# Patient Record
Sex: Male | Born: 1950
Health system: Southern US, Community
[De-identification: ages and names within clinical notes are randomized; demographics above are authoritative.]

## PROBLEM LIST (undated history)

## (undated) DIAGNOSIS — K589 Irritable bowel syndrome without diarrhea: Secondary | ICD-10-CM

## (undated) DIAGNOSIS — I10 Essential (primary) hypertension: Secondary | ICD-10-CM

## (undated) DIAGNOSIS — E785 Hyperlipidemia, unspecified: Secondary | ICD-10-CM

## (undated) DIAGNOSIS — K635 Polyp of colon: Secondary | ICD-10-CM

## (undated) HISTORY — DX: Irritable bowel syndrome, unspecified: K58.9

## (undated) HISTORY — PX: COLONOSCOPY: SHX174

## (undated) HISTORY — DX: Polyp of colon: K63.5

## (undated) HISTORY — DX: Hyperlipidemia, unspecified: E78.5

## (undated) HISTORY — DX: Essential (primary) hypertension: I10

## (undated) HISTORY — PX: APPENDECTOMY: SHX54

## (undated) HISTORY — PX: HERNIA REPAIR: SHX51

## (undated) HISTORY — PX: POLYPECTOMY: SHX149

---

## 1999-01-12 ENCOUNTER — Other Ambulatory Visit: Admission: RE | Admit: 1999-01-12 | Discharge: 1999-01-12 | Payer: Self-pay | Admitting: Internal Medicine

## 1999-01-12 ENCOUNTER — Encounter (INDEPENDENT_AMBULATORY_CARE_PROVIDER_SITE_OTHER): Payer: Self-pay | Admitting: Specialist

## 2007-09-21 ENCOUNTER — Ambulatory Visit (HOSPITAL_COMMUNITY): Admission: RE | Admit: 2007-09-21 | Discharge: 2007-09-21 | Payer: Self-pay | Admitting: Family Medicine

## 2009-05-11 ENCOUNTER — Encounter (INDEPENDENT_AMBULATORY_CARE_PROVIDER_SITE_OTHER): Payer: Self-pay | Admitting: *Deleted

## 2009-05-12 ENCOUNTER — Ambulatory Visit: Payer: Self-pay | Admitting: Internal Medicine

## 2009-05-12 ENCOUNTER — Encounter (INDEPENDENT_AMBULATORY_CARE_PROVIDER_SITE_OTHER): Payer: Self-pay | Admitting: *Deleted

## 2009-05-24 ENCOUNTER — Ambulatory Visit: Payer: Self-pay | Admitting: Internal Medicine

## 2009-05-25 ENCOUNTER — Encounter: Payer: Self-pay | Admitting: Internal Medicine

## 2009-07-05 ENCOUNTER — Ambulatory Visit (HOSPITAL_COMMUNITY): Admission: RE | Admit: 2009-07-05 | Discharge: 2009-07-05 | Payer: Self-pay | Admitting: General Surgery

## 2010-03-06 NOTE — Miscellaneous (Signed)
Summary: [facility] Previsit/prep  Clinical Lists Changes  Medications: Added new medication of MOVIPREP 100 GM  SOLR (PEG-KCL-NACL-NASULF-NA ASC-C) As per prep instructions. - Signed Rx of MOVIPREP 100 GM  SOLR (PEG-KCL-NACL-NASULF-NA ASC-C) As per prep instructions.;  #1 x 0;  Signed;  Entered by: Wyona Almas RN;  Authorized by: Hilarie Fredrickson MD;  Method used: Electronically to [facility] [phone]*, [address] [place], [place], [place]  [address], Ph: [phone] or [phone], Fax: [phone] Allergies: Added new allergy or adverse reaction of PENICILLIN Observations: Added new observation of NKA: F (05/12/2009 8:30)    Prescriptions: MOVIPREP 100 GM  SOLR (PEG-KCL-NACL-NASULF-NA ASC-C) As per prep instructions.  #1 x 0   Entered by:   Wyona Almas RN   Authorized by:   Hilarie Fredrickson MD   Signed by:   Wyona Almas RN on 05/12/2009   Method used:   Electronically to        [facility] [phone]* (retail)       [address]       [place], [place]  [address]       Ph: [phone] or [phone]       Fax: [phone]   RxID:   [id number]

## 2010-03-06 NOTE — Letter (Signed)
Summary: [facility] Instructions  [facility]  [address] [place], [place] [address]   Phone: [phone]  Fax: [phone]       Rodney Glover    [birth year]    MRN: [id number]        Procedure Day Dorna Bloom:  [facility]  05/24/09     Arrival Time:  10:30AM     Procedure Time:  11:30AM     Location of Procedure:                    _ X_  [facility] (4th Floor)   PREPARATION FOR COLONOSCOPY WITH MOVIPREP   Starting 5 days prior to your procedure 05/19/09 do not eat nuts, seeds, popcorn, corn, beans, peas,  salads, or any raw vegetables.  Do not take any fiber supplements (e.g. Metamucil, Citrucel, and Benefiber).  THE DAY BEFORE YOUR PROCEDURE         DATE: TUESDAY  DAY: 05/23/09  1.  Drink clear liquids the entire day-NO SOLID FOOD  2.  Do not drink anything colored red or purple.  Avoid juices with pulp.  No orange juice.  3.  Drink at least 64 oz. (8 glasses) of fluid/clear liquids during the day to prevent dehydration and help the prep work efficiently.  CLEAR LIQUIDS INCLUDE: Water Jello Ice Popsicles Tea (sugar ok, no milk/cream) Powdered fruit flavored drinks Coffee (sugar ok, no milk/cream) Gatorade Juice: apple, white grape, white cranberry  Lemonade Clear bullion, consomm, broth Carbonated beverages (any kind) Strained chicken noodle soup Hard Candy                             4.  In the morning, mix first dose of MoviPrep solution:    Empty 1 Pouch A and 1 Pouch B into the disposable container    Add lukewarm drinking water to the top line of the container. Mix to dissolve    Refrigerate (mixed solution should be used within 24 hrs)  5.  Begin drinking the prep at 5:00 p.m. The MoviPrep container is divided by 4 marks.   Every 15 minutes drink the solution down to the next mark (approximately 8 oz) until the full liter is complete.   6.  Follow completed prep with 16 oz of clear liquid of your choice (Nothing red or purple).   Continue to drink clear liquids until bedtime.  7.  Before going to bed, mix second dose of MoviPrep solution:    Empty 1 Pouch A and 1 Pouch B into the disposable container    Add lukewarm drinking water to the top line of the container. Mix to dissolve    Refrigerate  THE DAY OF YOUR PROCEDURE      DATE: 05/24/09 DAY: WEDNESDAY  Beginning at 6:30AM (5 hours before procedure):         1. Every 15 minutes, drink the solution down to the next mark (approx 8 oz) until the full liter is complete.  2. Follow completed prep with 16 oz. of clear liquid of your choice.    3. You may drink clear liquids until 9:30AM (2 HOURS BEFORE PROCEDURE).   MEDICATION INSTRUCTIONS  Unless otherwise instructed, you should take regular prescription medications with a small sip of water   as early as possible the morning of your procedure.  Diabetic patients - see separate instructions.    Additional medication instructions: Be sure to take your Amlodipine/Benazapril  the morning of your procedure.         OTHER INSTRUCTIONS  You will need a responsible adult at least 59 years of age to accompany you and drive you home.   This person must remain in the waiting room during your procedure.  Wear loose fitting clothing that is easily removed.  Leave jewelry and other valuables at home.  However, you may wish to bring a book to read or  an iPod/MP3 player to listen to music as you wait for your procedure to start.  Remove all body piercing jewelry and leave at home.  Total time from sign-in until discharge is approximately 2-3 hours.  You should go home directly after your procedure and rest.  You can resume normal activities the  day after your procedure.  The day of your procedure you should not:   Drive   Make legal decisions   Operate machinery   Drink alcohol   Return to work  You will receive specific instructions about eating, activities and medications before you  leave.    The above instructions have been reviewed and explained to me by   Wyona Almas RN  May 12, 2009 9:05 AM    I fully understand and can verbalize these instructions _____________________________ Date _________

## 2010-03-06 NOTE — Procedures (Signed)
Summary: Colonoscopy  Patient: Rodney Glover Note: All result statuses are Final unless otherwise noted.  Tests: (1) Colonoscopy (COL)   COL Colonoscopy           DONE     [facility]     [address]     [place], [place]  [address]           COLONOSCOPY PROCEDURE REPORT           PATIENT:  Rodney Glover, Rodney Glover  MR#:  [id number]     BIRTHDATE:  Feb 16, 1950, 59 yrs. old  GENDER:  male     ENDOSCOPIST:  Wilhemina Bonito. Eda Keys, MD     REF. BY:  Surveillance Program Recall,     PROCEDURE DATE:  05/24/2009     PROCEDURE:  Colonoscopy with snare polypectomy X 5     ASA CLASS:  Class II     INDICATIONS:  history of pre-cancerous (adenomatous) colon polyps     (INDEX 2000, f/u 2005), surveillance and high-risk screening     MEDICATIONS:   Fentanyl 100 mcg IV, Versed 10 mg IV           DESCRIPTION OF PROCEDURE:   After the risks benefits and     alternatives of the procedure were thoroughly explained, informed     consent was obtained.  Digital rectal exam was performed and     revealed no abnormalities.   The LB [id number] endoscope     was introduced through the anus and advanced to the cecum, which     was identified by both the appendix and ileocecal valve, without     limitations.Time to cecum = 3:74min. The quality of the prep was     excellent, using MoviPrep.  The instrument was then slowly     withdrawn (time = 18:39 min) as the colon was fully examined.     <<PROCEDUREIMAGES>>           FINDINGS:  Five polyps were found in the cecum (4mm) and     ascending colon (10mm,3mm,3mm,5mm). Polyps were snared without     cautery. Retrieval was successful in 4 of 5.  This was otherwise a     normal examination of the colon.   Retroflexed views in the rectum     revealed internal hemorrhoids.    The scope was then withdrawn     from the patient and the procedure completed.           COMPLICATIONS:  None     ENDOSCOPIC IMPRESSION:     1) Five small polyps - removed     2)  Otherwise normal examination     3) Small Internal hemorrhoids           RECOMMENDATIONS:     1) Follow up colonoscopy in 5 years           ______________________________     Wilhemina Bonito. Eda Keys, MD           CC:  Rudi Heap, MD; The Patient           n.     eSIGNED:   Wilhemina Bonito. Eda Keys at 05/24/2009 01:02 PM           Winfred Burn, [id number]  Note: An exclamation mark (!) indicates a result that was not dispersed into the flowsheet. Document Creation Date: 05/24/2009 1:03 PM _______________________________________________________________________  (1) Order result status: Final Collection or observation date-time: 05/24/2009 12:53 Requested  date-time:  Receipt date-time:  Reported date-time:  Referring Physician:   Ordering Physician: Fransico Setters [phone]) Specimen Source:  Source: Launa Grill Order Number: [id number] Lab site:   Appended Document: Colonoscopy recall in 5 yrs/05-2014     Procedures Next Due Date:    Colonoscopy: 06/2014

## 2010-03-06 NOTE — Letter (Signed)
Summary: Diabetic Instructions  [facility]  [address] [place], [place] [address]   Phone: [phone]  Fax: [phone]    Rodney Glover [birth year] MRN: [id number]   _ x _   ORAL DIABETIC MEDICATION INSTRUCTIONS  The day before your procedure:   Take your diabetic pill as you do normally  The day of your procedure:   Do not take your diabetic pill    We will check your blood sugar levels during the admission process and again in Recovery before discharging you home  ________________________________________________________________________

## 2010-03-06 NOTE — Letter (Signed)
Summary: Patient Notice- Polyp Results  [facility]  [address] [place], [place] [address]   Phone: [phone]  Fax: [phone]        May 25, 2009 MRN: [id number]    Rodney Glover [address] [place], [place]  [address]    Dear [name],  I am pleased to inform you that the colon polyp(s) removed during your recent colonoscopy was (were) found to be benign (no cancer detected) upon pathologic examination.  I recommend you have a repeat colonoscopy examination in 5 years to look for recurrent polyps, as having colon polyps increases your risk for having recurrent polyps or even colon cancer in the future.  Should you develop new or worsening symptoms of abdominal pain, bowel habit changes or bleeding from the rectum or bowels, please schedule an evaluation with either your primary care physician or with me.  Additional information/recommendations:  __ No further action with gastroenterology is needed at this time. Please      follow-up with your primary care physician for your other healthcare      needs.  Please call us if you are having persistent problems or have questions about your condition that have not been fully answered at this time.  Sincerely,  Hilarie Fredrickson MD  This letter has been electronically signed by your physician.  Appended Document: Patient Notice- Polyp Results letter mailed 4.26.11

## 2010-03-07 LAB — HEMOGLOBIN A1C: Hgb A1c MFr Bld: 6.5 % — AB (ref 4.0–6.0)

## 2010-04-23 LAB — BASIC METABOLIC PANEL
BUN: 11 mg/dL (ref 6–23)
CO2: 29 mEq/L (ref 19–32)
Chloride: 105 mEq/L (ref 96–112)
Creatinine, Ser: 0.79 mg/dL (ref 0.4–1.5)
Potassium: 4.4 mEq/L (ref 3.5–5.1)

## 2010-04-23 LAB — DIFFERENTIAL
Basophils Absolute: 0 10*3/uL (ref 0.0–0.1)
Basophils Relative: 1 % (ref 0–1)
Eosinophils Absolute: 0.1 10*3/uL (ref 0.0–0.7)
Lymphocytes Relative: 38 % (ref 12–46)
Monocytes Relative: 9 % (ref 3–12)
Neutro Abs: 2.7 10*3/uL (ref 1.7–7.7)
Neutrophils Relative %: 51 % (ref 43–77)

## 2010-04-23 LAB — GLUCOSE, CAPILLARY: Glucose-Capillary: 144 mg/dL — ABNORMAL HIGH (ref 70–99)

## 2010-04-23 LAB — CBC
HCT: 42.1 % (ref 39.0–52.0)
MCV: 93.4 fL (ref 78.0–100.0)

## 2010-04-24 LAB — GLUCOSE, CAPILLARY: Glucose-Capillary: 116 mg/dL — ABNORMAL HIGH (ref 70–99)

## 2010-05-23 ENCOUNTER — Encounter: Payer: Self-pay | Admitting: Nurse Practitioner

## 2010-10-11 ENCOUNTER — Telehealth: Payer: Self-pay | Admitting: Internal Medicine

## 2010-10-12 NOTE — Telephone Encounter (Signed)
Pt scheduled to see Amy Esterwood PA 10/15/10@11AM , Cato Mulligan to notify pt of appt date and time.

## 2010-10-15 ENCOUNTER — Ambulatory Visit (INDEPENDENT_AMBULATORY_CARE_PROVIDER_SITE_OTHER): Payer: BC Managed Care – PPO | Admitting: Physician Assistant

## 2010-10-15 ENCOUNTER — Encounter: Payer: Self-pay | Admitting: Physician Assistant

## 2010-10-15 ENCOUNTER — Other Ambulatory Visit (INDEPENDENT_AMBULATORY_CARE_PROVIDER_SITE_OTHER): Payer: BC Managed Care – PPO

## 2010-10-15 VITALS — BP 110/60 | HR 60 | Ht 67.0 in | Wt 177.0 lb

## 2010-10-15 DIAGNOSIS — E119 Type 2 diabetes mellitus without complications: Secondary | ICD-10-CM | POA: Insufficient documentation

## 2010-10-15 DIAGNOSIS — E1169 Type 2 diabetes mellitus with other specified complication: Secondary | ICD-10-CM | POA: Insufficient documentation

## 2010-10-15 DIAGNOSIS — R197 Diarrhea, unspecified: Secondary | ICD-10-CM

## 2010-10-15 DIAGNOSIS — Z86010 Personal history of colonic polyps: Secondary | ICD-10-CM

## 2010-10-15 DIAGNOSIS — E785 Hyperlipidemia, unspecified: Secondary | ICD-10-CM

## 2010-10-15 LAB — CBC WITH DIFFERENTIAL/PLATELET
Basophils Absolute: 0 10*3/uL (ref 0.0–0.1)
Basophils Relative: 0.2 % (ref 0.0–3.0)
HCT: 45 % (ref 39.0–52.0)
Hemoglobin: 15.1 g/dL (ref 13.0–17.0)
Lymphocytes Relative: 26.7 % (ref 12.0–46.0)
Monocytes Absolute: 0.6 10*3/uL (ref 0.1–1.0)
Platelets: 235 10*3/uL (ref 150.0–400.0)
RBC: 4.94 Mil/uL (ref 4.22–5.81)

## 2010-10-15 LAB — BASIC METABOLIC PANEL
BUN: 12 mg/dL (ref 6–23)
GFR: 114.84 mL/min (ref >60.00)
Glucose, Bld: 113 mg/dL — ABNORMAL HIGH (ref 70–99)
Sodium: 140 mEq/L (ref 135–145)

## 2010-10-15 MED ORDER — METRONIDAZOLE 250 MG PO TABS: ORAL_TABLET | ORAL | Status: DC

## 2010-10-15 MED ORDER — ALIGN 4 MG PO CAPS: 1.0000 | ORAL_CAPSULE | ORAL | Status: DC

## 2010-10-15 NOTE — Patient Instructions (Signed)
Please go to the basement level to have your labs drawn.  We have given you samples of Align Probiotic to take 1 capsule daily x 21 days. We sent a prescription to [facility]  for Flagyl to take as directed.    We made you a follow up appointment to see Dr. Marina Goodell on 10-30-2010.  Appointment card given.

## 2010-10-15 NOTE — Progress Notes (Signed)
Subjective:    Patient ID: Rodney Glover, male    DOB: 01/06/1951, 60 y.o.   MRN: [id number]  HPI Rodney Glover is a pleasant 60 year old white male who comes in today with his wife  for evaluation of diarrhea. He is known to Dr. Yancey Flemings; Had colonoscopy in  April of 2011 for followup. He was found to have 5 small polyps all of which were removed and small internal hemorrhoids. Path on his polyps showed 2 adenomatous polyps and the 2 hyperplastic polyps. Plan is for followup at a five-year interval.  Patient relates onset of his current symptoms with watery diarrhea at the beginning of July 2012. He says he started acutely with diarrhea with "pouring" watery stools. He says  he did not have any other associated symptoms, no cramping, pain, bleedin,g fever ,chills, nausea, vomiting etc. After the symptoms continued for about a week he started using Imodium which he did not find helpful and was persisting with 5-6 watery stools per day which seemed to be exacerbated  postprandially. He eventually started  an over-the-counter probiotic which she's now been on for about 3 weeks and again did not notice any change. [facility] and was given a prescription for Lomotil which she took twice daily which did seem to slow the diarrhea some but did not stop it. He denies any bloating, gas etc. He has had a gradual weight loss of about 5 pounds since onset. At this time he is usually having 3-4 watery stools per day. He had not been on any recent antibiotics has not started any new medications for several months nor any dosage changes. He did have stool studies done with stool for C. Difficile, O&P, and culture on September 3 and these were all negative. On further questioning he [place] etc[place] and says that his symptoms started after he had eaten a large amount of watermelon at the lake  earlier this summer.    Review of  Systems  Constitutional: Positive for unexpected weight change.  HENT: Negative.   Eyes: Negative.   Respiratory: Negative.   Cardiovascular: Negative.   Gastrointestinal: Positive for diarrhea.  Genitourinary: Negative.   Musculoskeletal: Negative.   Skin: Negative.   Neurological: Negative.   Psychiatric/Behavioral: Negative.    Outpatient Prescriptions Prior to Visit  Medication Sig Dispense Refill  . amLODipine-benazepril (LOTREL) 5-20 MG per capsule Take 1 capsule by mouth daily.        Marland Kitchen atorvastatin (LIPITOR) 40 MG tablet Take 40 mg by mouth daily.        . metFORMIN (GLUCOPHAGE) 500 MG tablet Take 500 mg by mouth 2 (two) times daily with a meal.         Allergies  Allergen Reactions  . Penicillins     REACTION: hives      Past history; pertinent for hyperlipidemia adult onset diabetes mellitus Family history and social history reviewed in the epicl record Objective:   Physical Exam Well-developed white male in no acute distress, pleasant, alert oriented x3 HEENT; ;  Normocephalic, EOMI ,PERRLA, sclera anicteric,Neck; Supple no JVD, Cardiovascular; regular rate and rhythm with S1-S2 no murmur rub or gallop, Pulmonary; clear bilaterally, Abdomen; soft nondistended, nontender no palpable mass or hepatosplenomegaly, bowel sounds active, Rectal; not done, Extremities; no clubbing cyanosis or edema skin benign, Psych; mood and affect normal in appropriate       Assessment & Plan:  #  59 60 year old male with two-month history of watery diarrhea; painless, nonbloody, and associated with 4-5 pound weight loss. I suspect an infectious etiology, possibly giardiasis despite negative stool studies. Other possibility would be a microscopic colitis, or medication-induced diarrhea though this is felt less likely at this time.  Plan; CBC, CRP, BMET today Start Align one daily, samples given Start an empiric trial of metronidazole 250 mg 4 times daily x10 days, patient cautioned to avoid  alcohol use during this time Return office visit in 2-3 weeks for followup with Dr. Marina Goodell- if symptoms are persisting he will need further workup, including consideration of repeat colonoscopy with biopsies.  #2 Personal history of adenomatous colon polyps   Due for followup colonoscopy April 2016  Reviewed and agree with management. Carie Caddy. Pyrtle, MD

## 2010-10-16 ENCOUNTER — Telehealth: Payer: Self-pay | Admitting: *Deleted

## 2010-10-16 NOTE — Telephone Encounter (Signed)
Message copied by Daphine Deutscher on Tue Oct 16, 2010  2:57 PM ------      Message from: [place], [place] S      Created: Tue Oct 16, 2010  1:29 PM       Please let Nyshaun know his labs are normal

## 2010-10-16 NOTE — Telephone Encounter (Signed)
Patient notified of results as per Amy Esterwood, PA 

## 2010-10-16 NOTE — Telephone Encounter (Signed)
Left a message for patient to call me. 

## 2010-10-30 ENCOUNTER — Ambulatory Visit: Payer: BC Managed Care – PPO | Admitting: Internal Medicine

## 2010-11-22 ENCOUNTER — Ambulatory Visit: Payer: Self-pay | Admitting: Internal Medicine

## 2012-05-08 ENCOUNTER — Other Ambulatory Visit: Payer: Self-pay | Admitting: *Deleted

## 2012-05-08 MED ORDER — EZETIMIBE 10 MG PO TABS: 10.0000 mg | ORAL_TABLET | Freq: Every day | ORAL | Status: DC

## 2012-05-14 ENCOUNTER — Ambulatory Visit (INDEPENDENT_AMBULATORY_CARE_PROVIDER_SITE_OTHER): Payer: BC Managed Care – PPO | Admitting: Nurse Practitioner

## 2012-05-14 ENCOUNTER — Encounter: Payer: Self-pay | Admitting: Nurse Practitioner

## 2012-05-14 VITALS — BP 146/88 | HR 55 | Temp 97.1°F | Ht 65.0 in | Wt 170.5 lb

## 2012-05-14 DIAGNOSIS — E119 Type 2 diabetes mellitus without complications: Secondary | ICD-10-CM

## 2012-05-14 DIAGNOSIS — Z139 Encounter for screening, unspecified: Secondary | ICD-10-CM

## 2012-05-14 DIAGNOSIS — I10 Essential (primary) hypertension: Secondary | ICD-10-CM

## 2012-05-14 DIAGNOSIS — E785 Hyperlipidemia, unspecified: Secondary | ICD-10-CM

## 2012-05-14 LAB — COMPLETE METABOLIC PANEL WITH GFR
Albumin: 4.4 g/dL (ref 3.5–5.2)
BUN: 9 mg/dL (ref 6–23)
CO2: 29 mEq/L (ref 19–32)
Calcium: 9.4 mg/dL (ref 8.4–10.5)
Chloride: 102 mEq/L (ref 96–112)
Creat: 0.88 mg/dL (ref 0.50–1.35)
GFR, Est African American: >89 mL/min
Potassium: 4.2 mEq/L (ref 3.5–5.3)
Sodium: 138 mEq/L (ref 135–145)

## 2012-05-14 LAB — POCT GLYCOSYLATED HEMOGLOBIN (HGB A1C): Hemoglobin A1C: 6.7

## 2012-05-14 NOTE — Progress Notes (Signed)
Subjective:    Patient ID: Rodney Glover, male    DOB: 1950-04-29, 62 y.o.   MRN: [id number]  Hyperlipidemia This is a chronic problem. The current episode started more than 1 year ago. The problem is uncontrolled. Recent lipid tests were reviewed and are high. Exacerbating diseases include diabetes. There are no known factors aggravating his hyperlipidemia. Pertinent negatives include no chest pain, leg pain, myalgias or shortness of breath. Current antihyperlipidemic treatment includes statins and ezetimibe. The current treatment provides mild improvement of lipids. There are no compliance problems.  Risk factors for coronary artery disease include diabetes mellitus, hypertension and male sex.  Diabetes He presents for his follow-up diabetic visit. He has type 2 diabetes mellitus. The initial diagnosis of diabetes was made 10 years ago. His disease course has been improving. There are no hypoglycemic associated symptoms. Pertinent negatives for hypoglycemia include no headaches. Pertinent negatives for diabetes include no blurred vision, no chest pain, no fatigue, no foot ulcerations, no polydipsia, no polyphagia, no polyuria and no weakness. There are no hypoglycemic complications. There are no diabetic complications. Risk factors for coronary artery disease include dyslipidemia, male sex and hypertension. Current diabetic treatment includes oral agent (monotherapy). He is compliant with treatment all of the time. His weight is stable. He is following a generally healthy, low fat/cholesterol and low salt diet. When asked about meal planning, he reported none. He participates in exercise three times a week. His overall blood glucose range is 130-140 mg/dl. An ACE inhibitor/angiotensin II receptor blocker is being taken. He does not see a podiatrist.Eye exam is current.  Hypertension This is a chronic problem. The current episode started more than 1 year ago. The problem has been gradually improving  since onset. The problem is controlled. Pertinent negatives include no blurred vision, chest pain, headaches, palpitations, peripheral edema or shortness of breath. There are no associated agents to hypertension. Risk factors for coronary artery disease include diabetes mellitus and male gender. Past treatments include ACE inhibitors. The current treatment provides moderate improvement. There are no compliance problems.    Pt has no other complaints at this time. Has noticed possible skin cancer on outside of both ears. Already has scheduled appt on 4/22 with oncologist. Has had lesions removed in the past.   Review of Systems  Constitutional: Negative for fatigue.  HENT: Negative.   Eyes: Negative.  Negative for blurred vision.  Respiratory: Negative for shortness of breath.   Cardiovascular: Negative for chest pain and palpitations.  Gastrointestinal: Negative.   Endocrine: Negative for polydipsia, polyphagia and polyuria.  Genitourinary: Negative.   Musculoskeletal: Negative for myalgias.  Neurological: Negative for weakness and headaches.  Psychiatric/Behavioral: Negative.        Objective:   Physical Exam  Constitutional: He is oriented to person, place, and time. He appears well-developed and well-nourished.  HENT:  Head: Normocephalic.  Right Ear: External ear normal.  Left Ear: External ear normal.  Nose: Nose normal.  Mouth/Throat: Oropharynx is clear and moist.  Eyes: EOM are normal. Pupils are equal, round, and reactive to light.  Neck: Normal range of motion. Neck supple. No thyromegaly present.  Cardiovascular: Normal rate, regular rhythm, normal heart sounds and intact distal pulses.   No murmur heard. Pulmonary/Chest: Effort normal and breath sounds normal. He has no wheezes. He has no rales.  Abdominal: Soft. Bowel sounds are normal.  Genitourinary: Prostate normal and penis normal.  Musculoskeletal: Normal range of motion.  Neurological: He is alert and oriented  to person,  place, and time.  Skin: Skin is warm and dry.  Small white patch on R. Ear   Psychiatric: He has a normal mood and affect. His behavior is normal. Judgment and thought content normal.    BP 146/88  Pulse 55  Temp(Src) 97.1 F (36.2 C) (Oral)  Ht 5\' 5"  (1.651 m)  Wt 170 lb 8 oz (77.338 kg)  BMI 28.37 kg/m2  Results for orders placed in visit on 05/14/12  POCT GLYCOSYLATED HEMOGLOBIN (HGB A1C)      Result Value Range   Hemoglobin A1C 6.7           Assessment & Plan:  1. Hyperlipidemia Continue low fat diet and exercise - NMR Lipoprofile with Lipids  2. HTN (hypertension) Low Na+ diet Exercise - COMPLETE METABOLIC PANEL WITH GFR - NMR Lipoprofile with Lipids  3. Screening - Vitamin D 25 hydroxy  4. Diabetes mellitus type 2, controlled, without complications Monitor blood sugars daily Continue diet and exercise POCT glycosylated hemoglobin (Hb A1C)  Mary-Margaret Daphine Deutscher, FNP  - COMPLETE METABOLIC PANEL WITH GFR

## 2012-05-15 LAB — NMR LIPOPROFILE WITH LIPIDS
HDL Particle Number: 36.2 umol/L (ref >=30.5)
HDL Size: 9.1 nm — ABNORMAL LOW (ref >=9.2)
LDL Particle Number: 867 nmol/L (ref <1000)
LDL Size: 19.9 nm — ABNORMAL LOW (ref >20.5)
Small LDL Particle Number: 561 nmol/L — ABNORMAL HIGH (ref <=527)
Triglycerides: 68 mg/dL (ref <150)
VLDL Size: 49.4 nm — ABNORMAL HIGH (ref <=46.6)

## 2012-06-01 ENCOUNTER — Other Ambulatory Visit: Payer: Self-pay | Admitting: *Deleted

## 2012-06-01 MED ORDER — METFORMIN HCL 1000 MG PO TABS: 1000.0000 mg | ORAL_TABLET | Freq: Two times a day (BID) | ORAL | Status: DC

## 2012-07-22 ENCOUNTER — Other Ambulatory Visit: Payer: Self-pay | Admitting: *Deleted

## 2012-07-22 MED ORDER — METFORMIN HCL 1000 MG PO TABS: 1000.0000 mg | ORAL_TABLET | Freq: Two times a day (BID) | ORAL | Status: DC

## 2012-08-09 ENCOUNTER — Other Ambulatory Visit: Payer: Self-pay | Admitting: Nurse Practitioner

## 2012-08-16 ENCOUNTER — Other Ambulatory Visit: Payer: Self-pay | Admitting: Nurse Practitioner

## 2012-08-27 ENCOUNTER — Other Ambulatory Visit: Payer: Self-pay | Admitting: Nurse Practitioner

## 2012-09-11 ENCOUNTER — Telehealth: Payer: Self-pay | Admitting: Nurse Practitioner

## 2012-09-11 NOTE — Telephone Encounter (Signed)
Appt changed per pt request.

## 2012-09-15 ENCOUNTER — Ambulatory Visit: Payer: BC Managed Care – PPO | Admitting: Nurse Practitioner

## 2012-09-17 ENCOUNTER — Ambulatory Visit (INDEPENDENT_AMBULATORY_CARE_PROVIDER_SITE_OTHER): Payer: BC Managed Care – PPO | Admitting: Nurse Practitioner

## 2012-09-17 ENCOUNTER — Encounter: Payer: Self-pay | Admitting: Nurse Practitioner

## 2012-09-17 VITALS — BP 143/81 | HR 59 | Temp 97.2°F | Ht 65.0 in | Wt 173.0 lb

## 2012-09-17 DIAGNOSIS — E119 Type 2 diabetes mellitus without complications: Secondary | ICD-10-CM

## 2012-09-17 DIAGNOSIS — E785 Hyperlipidemia, unspecified: Secondary | ICD-10-CM

## 2012-09-17 DIAGNOSIS — I10 Essential (primary) hypertension: Secondary | ICD-10-CM

## 2012-09-17 MED ORDER — METFORMIN HCL 1000 MG PO TABS: 1000.0000 mg | ORAL_TABLET | Freq: Two times a day (BID) | ORAL | Status: DC

## 2012-09-17 MED ORDER — ROSUVASTATIN CALCIUM 20 MG PO TABS: 20.0000 mg | ORAL_TABLET | Freq: Every day | ORAL | Status: DC

## 2012-09-17 MED ORDER — GLUCOSE BLOOD VI STRP: ORAL_STRIP | Status: DC

## 2012-09-17 MED ORDER — AMLODIPINE BESY-BENAZEPRIL HCL 5-20 MG PO CAPS: 1.0000 | ORAL_CAPSULE | Freq: Every day | ORAL | Status: DC

## 2012-09-17 MED ORDER — EZETIMIBE 10 MG PO TABS: 10.0000 mg | ORAL_TABLET | Freq: Every day | ORAL | Status: DC

## 2012-09-17 NOTE — Progress Notes (Signed)
Subjective:    Patient ID: Rodney Glover, male    DOB: 06/30/1950, 62 y.o.   MRN: [id number]  Hypertension This is a chronic problem. The current episode started more than 1 year ago. The problem has been gradually improving since onset. The problem is controlled. Pertinent negatives include no blurred vision, chest pain, headaches, palpitations, peripheral edema or shortness of breath. There are no associated agents to hypertension. Risk factors for coronary artery disease include diabetes mellitus and male gender. Past treatments include ACE inhibitors. The current treatment provides moderate improvement. There are no compliance problems.   Diabetes He presents for his follow-up diabetic visit. He has type 2 diabetes mellitus. The initial diagnosis of diabetes was made 10 years ago. His disease course has been improving. There are no hypoglycemic associated symptoms. Pertinent negatives for hypoglycemia include no headaches. Pertinent negatives for diabetes include no blurred vision, no chest pain, no fatigue, no foot ulcerations, no polydipsia, no polyphagia, no polyuria and no weakness. There are no hypoglycemic complications. There are no diabetic complications. Risk factors for coronary artery disease include dyslipidemia, male sex and hypertension. Current diabetic treatment includes oral agent (monotherapy). He is compliant with treatment all of the time. His weight is stable. He is following a generally healthy, low fat/cholesterol and low salt diet. When asked about meal planning, he reported none. He participates in exercise three times a week. His overall blood glucose range is 130-140 mg/dl. An ACE inhibitor/angiotensin II receptor blocker is being taken. He does not see a podiatrist.Eye exam is current.  Hyperlipidemia This is a chronic problem. The current episode started more than 1 year ago. The problem is uncontrolled. Recent lipid tests were reviewed and are high. Exacerbating diseases  include diabetes. There are no known factors aggravating his hyperlipidemia. Pertinent negatives include no chest pain, leg pain, myalgias or shortness of breath. Current antihyperlipidemic treatment includes statins and ezetimibe. The current treatment provides mild improvement of lipids. There are no compliance problems.  Risk factors for coronary artery disease include diabetes mellitus, hypertension and male sex.   Pt has no other complaints at this time. Has noticed possible skin cancer on outside of both ears. Already has scheduled appt on 4/22 with oncologist. Has had lesions removed in the past.   Review of Systems  Constitutional: Negative for fatigue.  HENT: Negative.   Eyes: Negative.  Negative for blurred vision.  Respiratory: Negative for shortness of breath.   Cardiovascular: Negative for chest pain and palpitations.  Gastrointestinal: Negative.   Endocrine: Negative for polydipsia, polyphagia and polyuria.  Genitourinary: Negative.   Musculoskeletal: Negative for myalgias.  Neurological: Negative for weakness and headaches.  Psychiatric/Behavioral: Negative.        Objective:   Physical Exam  Constitutional: He is oriented to person, place, and time. He appears well-developed and well-nourished.  HENT:  Head: Normocephalic.  Right Ear: External ear normal.  Left Ear: External ear normal.  Nose: Nose normal.  Mouth/Throat: Oropharynx is clear and moist.  Eyes: EOM are normal. Pupils are equal, round, and reactive to light.  Neck: Normal range of motion. Neck supple. No thyromegaly present.  Cardiovascular: Normal rate, regular rhythm, normal heart sounds and intact distal pulses.   No murmur heard. Pulmonary/Chest: Effort normal and breath sounds normal. He has no wheezes. He has no rales.  Abdominal: Soft. Bowel sounds are normal.  Musculoskeletal: Normal range of motion.  Neurological: He is alert and oriented to person, place, and time.  Skin: Skin is warm  and  dry.  Small white patch on R. Ear   Psychiatric: He has a normal mood and affect. His behavior is normal. Judgment and thought content normal.    BP 143/81  Pulse 59  Temp(Src) 97.2 F (36.2 C) (Oral)  Ht 5\' 5"  (1.651 m)  Wt 173 lb (78.472 kg)  BMI 28.79 kg/m2  Results for orders placed in visit on 09/17/12  POCT GLYCOSYLATED HEMOGLOBIN (HGB A1C)      Result Value Range   Hemoglobin A1C 7.5           Assessment & Plan:  1. Diabetes mellitus Continue to count carbs- stricter so dont have to change meds - POCT glycosylated hemoglobin (Hb A1C) - metFORMIN (GLUCOPHAGE) 1000 MG tablet; Take 1 tablet (1,000 mg total) by mouth 2 (two) times daily with a meal.  Dispense: 60 tablet; Refill: 5 - glucose blood (ACCU-CHEK AVIVA PLUS) test strip; Test 1x a day- dx 250.02  Dispense: 100 each; Refill: 11  2. Hyperlipidemia Low fat diet an dexercise - CMP14+EGFR - NMR, lipoprofile - ezetimibe (ZETIA) 10 MG tablet; Take 1 tablet (10 mg total) by mouth daily.  Dispense: 30 tablet; Refill: 5 - rosuvastatin (CRESTOR) 20 MG tablet; Take 1 tablet (20 mg total) by mouth daily.  Dispense: 30 tablet; Refill: 5  3. Hypertension Low NA+ diet - amLODipine-benazepril (LOTREL) 5-20 MG per capsule; Take 1 capsule by mouth daily.  Dispense: 30 capsule; Refill: 5  Mary-Margaret Daphine Deutscher, FNP

## 2012-09-17 NOTE — Patient Instructions (Signed)
Health Maintenance, Males A healthy lifestyle and preventative care can promote health and wellness.  Maintain regular health, dental, and eye exams.  Eat a healthy diet. Foods like vegetables, fruits, whole grains, low-fat dairy products, and lean protein foods contain the nutrients you need without too many calories. Decrease your intake of foods high in solid fats, added sugars, and salt. Get information about a proper diet from your caregiver, if necessary.  Regular physical exercise is one of the most important things you can do for your health. Most adults should get at least 150 minutes of moderate-intensity exercise (any activity that increases your heart rate and causes you to sweat) each week. In addition, most adults need muscle-strengthening exercises on 2 or more days a week.   Maintain a healthy weight. The body mass index (BMI) is a screening tool to identify possible weight problems. It provides an estimate of body fat based on height and weight. Your caregiver can help determine your BMI, and can help you achieve or maintain a healthy weight. For adults 20 years and older:  A BMI below 18.5 is considered underweight.  A BMI of 18.5 to 24.9 is normal.  A BMI of 25 to 29.9 is considered overweight.  A BMI of 30 and above is considered obese.  Maintain normal blood lipids and cholesterol by exercising and minimizing your intake of saturated fat. Eat a balanced diet with plenty of fruits and vegetables. Blood tests for lipids and cholesterol should begin at age 20 and be repeated every 5 years. If your lipid or cholesterol levels are high, you are over 50, or you are a high risk for heart disease, you may need your cholesterol levels checked more frequently.Ongoing high lipid and cholesterol levels should be treated with medicines, if diet and exercise are not effective.  If you smoke, find out from your caregiver how to quit. If you do not use tobacco, do not start.  If you  choose to drink alcohol, do not exceed 2 drinks per day. One drink is considered to be 12 ounces (355 mL) of beer, 5 ounces (148 mL) of wine, or 1.5 ounces (44 mL) of liquor.  Avoid use of street drugs. Do not share needles with anyone. Ask for help if you need support or instructions about stopping the use of drugs.  High blood pressure causes heart disease and increases the risk of stroke. Blood pressure should be checked at least every 1 to 2 years. Ongoing high blood pressure should be treated with medicines if weight loss and exercise are not effective.  If you are 45 to 61 years old, ask your caregiver if you should take aspirin to prevent heart disease.  Diabetes screening involves taking a blood sample to check your fasting blood sugar level. This should be done once every 3 years, after age 45, if you are within normal weight and without risk factors for diabetes. Testing should be considered at a younger age or be carried out more frequently if you are overweight and have at least 1 risk factor for diabetes.  Colorectal cancer can be detected and often prevented. Most routine colorectal cancer screening begins at the age of 50 and continues through age 75. However, your caregiver may recommend screening at an earlier age if you have risk factors for colon cancer. On a yearly basis, your caregiver may provide home test kits to check for hidden blood in the stool. Use of a small camera at the end of a tube,   to directly examine the colon (sigmoidoscopy or colonoscopy), can detect the earliest forms of colorectal cancer. Talk to your caregiver about this at age 50, when routine screening begins. Direct examination of the colon should be repeated every 5 to 10 years through age 75, unless early forms of pre-cancerous polyps or small growths are found.  Hepatitis C blood testing is recommended for all people born from 1945 through 1965 and any individual with known risks for hepatitis C.  Healthy  men should no longer receive prostate-specific antigen (PSA) blood tests as part of routine cancer screening. Consult with your caregiver about prostate cancer screening.  Testicular cancer screening is not recommended for adolescents or adult males who have no symptoms. Screening includes self-exam, caregiver exam, and other screening tests. Consult with your caregiver about any symptoms you have or any concerns you have about testicular cancer.  Practice safe sex. Use condoms and avoid high-risk sexual practices to reduce the spread of sexually transmitted infections (STIs).  Use sunscreen with a sun protection factor (SPF) of 30 or greater. Apply sunscreen liberally and repeatedly throughout the day. You should seek shade when your shadow is shorter than you. Protect yourself by wearing long sleeves, pants, a wide-brimmed hat, and sunglasses year round, whenever you are outdoors.  Notify your caregiver of new moles or changes in moles, especially if there is a change in shape or color. Also notify your caregiver if a mole is larger than the size of a pencil eraser.  A one-time screening for abdominal aortic aneurysm (AAA) and surgical repair of large AAAs by sound wave imaging (ultrasonography) is recommended for ages 65 to 75 years who are current or former smokers.  Stay current with your immunizations. Document Released: 07/20/2007 Document Revised: 04/15/2011 Document Reviewed: 06/18/2010 [employer] Patient Information 2014 [employer].  

## 2012-09-19 LAB — CMP14+EGFR
AST: 35 IU/L (ref 0–40)
Albumin: 4.2 g/dL (ref 3.6–4.8)
Alkaline Phosphatase: 62 IU/L (ref 39–117)
BUN/Creatinine Ratio: 14 (ref 10–22)
BUN: 12 mg/dL (ref 8–27)
Chloride: 101 mmol/L (ref 97–108)
GFR calc Af Amer: 107 mL/min/{1.73_m2} (ref >59)
Sodium: 140 mmol/L (ref 134–144)
Total Bilirubin: 0.4 mg/dL (ref 0.0–1.2)

## 2012-09-19 LAB — NMR, LIPOPROFILE
Cholesterol: 118 mg/dL (ref <200)
HDL Particle Number: 35.5 umol/L (ref >=30.5)
LDL Particle Number: 1231 nmol/L — ABNORMAL HIGH (ref <1000)
LDL Size: 20.2 nm — ABNORMAL LOW (ref >20.5)
LP-IR Score: 51 — ABNORMAL HIGH (ref <=45)

## 2012-09-25 ENCOUNTER — Other Ambulatory Visit: Payer: Self-pay | Admitting: Nurse Practitioner

## 2012-09-25 ENCOUNTER — Encounter: Payer: Self-pay | Admitting: Family Medicine

## 2012-09-25 DIAGNOSIS — H6193 Disorder of external ear, unspecified, bilateral: Secondary | ICD-10-CM

## 2012-09-28 ENCOUNTER — Other Ambulatory Visit: Payer: Self-pay | Admitting: Nurse Practitioner

## 2012-10-06 ENCOUNTER — Ambulatory Visit (INDEPENDENT_AMBULATORY_CARE_PROVIDER_SITE_OTHER): Payer: BC Managed Care – PPO

## 2012-10-06 ENCOUNTER — Ambulatory Visit (INDEPENDENT_AMBULATORY_CARE_PROVIDER_SITE_OTHER): Payer: BC Managed Care – PPO | Admitting: Family Medicine

## 2012-10-06 VITALS — BP 147/83 | HR 67 | Temp 98.7°F | Ht 65.0 in | Wt 176.0 lb

## 2012-10-06 DIAGNOSIS — M25569 Pain in unspecified knee: Secondary | ICD-10-CM

## 2012-10-06 DIAGNOSIS — M25561 Pain in right knee: Secondary | ICD-10-CM

## 2012-10-06 MED ORDER — TRAMADOL HCL 50 MG PO TABS: ORAL_TABLET | ORAL | Status: DC

## 2012-10-06 MED ORDER — IBUPROFEN 600 MG PO TABS: 600.0000 mg | ORAL_TABLET | Freq: Three times a day (TID) | ORAL | Status: DC | PRN

## 2012-10-06 NOTE — Progress Notes (Signed)
  Subjective:    Patient ID: Rodney Glover, male    DOB: 1950-06-27, 62 y.o.   MRN: [id number]  HPI This 62 y.o. male presents for evaluation of right knee pain.  It has been this problem for Months and it has become worse.  He has hx of DJD of the left knee and has had to have Surgery.  He states the pain is just like the pain with the left knee.  He states he had a  Torn meniscus in his left knee and after surgery his knee was perfect.  He has been very Active playing golf and moving family over the last week and now he states his right knee Is blown.  He saw Dr. Roselie Awkward in [place] did his left knee..   Review of Systems C/o right knee pain No chest pain, SOB, HA, dizziness, vision change, N/V, diarrhea, constipation, dysuria, urinary urgency or frequency or rash.     Objective:   Physical Exam Vital signs noted  Well developed well nourished male.  HEENT - Head atraumatic Normocephalic                Eyes - PERRLA, Conjuctiva - clear Sclera- Clear EOMI                Ears - EAC's Wnl TM's Wnl Gross Hearing WNL                Nose - Nares patent                 Throat - oropharanx wnl Respiratory - Lungs CTA bilateral Cardiac - RRR S1 and S2 without murmur GI - Abdomen soft Nontender and bowel sounds active x 4 MS - Decreased ROM right knee and TTP right knee at patella and bilateral. Patient limping. Neuro - Grossly intact.  Xray Right knee with DJD     Assessment & Plan:  Right knee pain - Plan: traMADol (ULTRAM) 50 MG tablet, Ambulatory referral to Orthopedic Surgery, DG Knee 1-2 Views Right, ibuprofen (ADVIL,MOTRIN) 600 MG tablet.  He has crutches to use at home And advised him to use crutches.

## 2012-10-07 NOTE — Patient Instructions (Signed)
Knee Pain  The knee is the complex joint between your thigh and your lower leg. It is made up of bones, tendons, ligaments, and cartilage. The bones that make up the knee are:   The femur in the thigh.   The tibia and fibula in the lower leg.   The patella or kneecap riding in the groove on the lower femur.  CAUSES   Knee pain is a common complaint with many causes. A few of these causes are:   Injury, such as:   A ruptured ligament or tendon injury.   Torn cartilage.   Medical conditions, such as:   Gout   Arthritis   Infections   Overuse, over training or overdoing a physical activity.  Knee pain can be minor or severe. Knee pain can accompany debilitating injury. Minor knee problems often respond well to self-care measures or get well on their own. More serious injuries may need medical intervention or even surgery.  SYMPTOMS  The knee is complex. Symptoms of knee problems can vary widely. Some of the problems are:   Pain with movement and weight bearing.   Swelling and tenderness.   Buckling of the knee.   Inability to straighten or extend your knee.   Your knee locks and you cannot straighten it.   Warmth and redness with pain and fever.   Deformity or dislocation of the kneecap.  DIAGNOSIS   Determining what is wrong may be very straight forward such as when there is an injury. It can also be challenging because of the complexity of the knee. Tests to make a diagnosis may include:   Your caregiver taking a history and doing a physical exam.   Routine X-rays can be used to rule out other problems. X-rays will not reveal a cartilage tear. Some injuries of the knee can be diagnosed by:   Arthroscopy a surgical technique by which a small video camera is inserted through tiny incisions on the sides of the knee. This procedure is used to examine and repair internal knee joint problems. Tiny instruments can be used during arthroscopy to repair the torn knee cartilage (meniscus).   Arthrography  is a radiology technique. A contrast liquid is directly injected into the knee joint. Internal structures of the knee joint then become visible on X-ray film.   An MRI scan is a non x-ray radiology procedure in which magnetic fields and a computer produce two- or three-dimensional images of the inside of the knee. Cartilage tears are often visible using an MRI scanner. MRI scans have largely replaced arthrography in diagnosing cartilage tears of the knee.   Blood work.   Examination of the fluid that helps to lubricate the knee joint (synovial fluid). This is done by taking a sample out using a needle and a syringe.  TREATMENT  The treatment of knee problems depends on the cause. Some of these treatments are:   Depending on the injury, proper casting, splinting, surgery or physical therapy care will be needed.   Give yourself adequate recovery time. Do not overuse your joints. If you begin to get sore during workout routines, back off. Slow down or do fewer repetitions.   For repetitive activities such as cycling or running, maintain your strength and nutrition.   Alternate muscle groups. For example if you are a weight lifter, work the upper body on one day and the lower body the next.   Either tight or weak muscles do not give the proper support for your   knee. Tight or weak muscles do not absorb the stress placed on the knee joint. Keep the muscles surrounding the knee strong.   Take care of mechanical problems.   If you have flat feet, orthotics or special shoes may help. See your caregiver if you need help.   Arch supports, sometimes with wedges on the inner or outer aspect of the heel, can help. These can shift pressure away from the side of the knee most bothered by osteoarthritis.   A brace called an "unloader" brace also may be used to help ease the pressure on the most arthritic side of the knee.   If your caregiver has prescribed crutches, braces, wraps or ice, use as directed. The acronym for  this is PRICE. This means protection, rest, ice, compression and elevation.   Nonsteroidal anti-inflammatory drugs (NSAID's), can help relieve pain. But if taken immediately after an injury, they may actually increase swelling. Take NSAID's with food in your stomach. Stop them if you develop stomach problems. Do not take these if you have a history of ulcers, stomach pain or bleeding from the bowel. Do not take without your caregiver's approval if you have problems with fluid retention, heart failure, or kidney problems.   For ongoing knee problems, physical therapy may be helpful.   Glucosamine and chondroitin are over-the-counter dietary supplements. Both may help relieve the pain of osteoarthritis in the knee. These medicines are different from the usual anti-inflammatory drugs. Glucosamine may decrease the rate of cartilage destruction.   Injections of a corticosteroid drug into your knee joint may help reduce the symptoms of an arthritis flare-up. They may provide pain relief that lasts a few months. You may have to wait a few months between injections. The injections do have a small increased risk of infection, water retention and elevated blood sugar levels.   Hyaluronic acid injected into damaged joints may ease pain and provide lubrication. These injections may work by reducing inflammation. A series of shots may give relief for as long as 6 months.   Topical painkillers. Applying certain ointments to your skin may help relieve the pain and stiffness of osteoarthritis. Ask your pharmacist for suggestions. Many over the-counter products are approved for temporary relief of arthritis pain.   In some countries, doctors often prescribe topical NSAID's for relief of chronic conditions such as arthritis and tendinitis. A review of treatment with NSAID creams found that they worked as well as oral medications but without the serious side effects.  PREVENTION   Maintain a healthy weight. Extra pounds put  more strain on your joints.   Get strong, stay limber. Weak muscles are a common cause of knee injuries. Stretching is important. Include flexibility exercises in your workouts.   Be smart about exercise. If you have osteoarthritis, chronic knee pain or recurring injuries, you may need to change the way you exercise. This does not mean you have to stop being active. If your knees ache after jogging or playing basketball, consider switching to swimming, water aerobics or other low-impact activities, at least for a few days a week. Sometimes limiting high-impact activities will provide relief.   Make sure your shoes fit well. Choose footwear that is right for your sport.   Protect your knees. Use the proper gear for knee-sensitive activities. Use kneepads when playing volleyball or laying carpet. Buckle your seat belt every time you drive. Most shattered kneecaps occur in car accidents.   Rest when you are tired.  SEEK MEDICAL CARE IF:     You have knee pain that is continual and does not seem to be getting better.   SEEK IMMEDIATE MEDICAL CARE IF:   Your knee joint feels hot to the touch and you have a high fever.  MAKE SURE YOU:    Understand these instructions.   Will watch your condition.   Will get help right away if you are not doing well or get worse.  Document Released: 11/18/2006 Document Revised: 04/15/2011 Document Reviewed: 11/18/2006  [employer] Patient Information 2014 [employer].

## 2012-11-10 ENCOUNTER — Telehealth: Payer: Self-pay | Admitting: Nurse Practitioner

## 2012-11-10 NOTE — Telephone Encounter (Signed)
Have you seen this ?

## 2012-11-10 NOTE — Telephone Encounter (Signed)
i filled it out- I will have it checked on to n=make sure was faxed (please check on this)

## 2012-12-07 HISTORY — PX: KNEE SURGERY: SHX244

## 2013-01-05 ENCOUNTER — Ambulatory Visit: Payer: BC Managed Care – PPO | Admitting: Nurse Practitioner

## 2013-01-20 ENCOUNTER — Encounter: Payer: Self-pay | Admitting: Nurse Practitioner

## 2013-02-17 ENCOUNTER — Encounter: Payer: Self-pay | Admitting: Nurse Practitioner

## 2013-02-17 ENCOUNTER — Ambulatory Visit (INDEPENDENT_AMBULATORY_CARE_PROVIDER_SITE_OTHER): Payer: BC Managed Care – PPO | Admitting: Nurse Practitioner

## 2013-02-17 VITALS — BP 147/83 | HR 66 | Temp 97.1°F | Ht 65.0 in | Wt 172.5 lb

## 2013-02-17 DIAGNOSIS — E785 Hyperlipidemia, unspecified: Secondary | ICD-10-CM

## 2013-02-17 DIAGNOSIS — I10 Essential (primary) hypertension: Secondary | ICD-10-CM

## 2013-02-17 DIAGNOSIS — E119 Type 2 diabetes mellitus without complications: Secondary | ICD-10-CM

## 2013-02-17 LAB — POCT GLYCOSYLATED HEMOGLOBIN (HGB A1C): Hemoglobin A1C: 9

## 2013-02-17 LAB — POCT UA - MICROALBUMIN: MICROALBUMIN (UR) POC: 50 mg/L

## 2013-02-17 MED ORDER — GLIMEPIRIDE 4 MG PO TABS: 4.0000 mg | ORAL_TABLET | Freq: Every day | ORAL | Status: DC

## 2013-02-17 NOTE — Patient Instructions (Signed)

## 2013-02-17 NOTE — Progress Notes (Signed)
Subjective:    Patient ID: Rodney Glover, male    DOB: Sep 17, 1950, 63 y.o.   MRN: [id number]  Diabetes He presents for his follow-up diabetic visit. He has type 2 diabetes mellitus. The initial diagnosis of diabetes was made 10 years ago. His disease course has been improving. There are no hypoglycemic associated symptoms. Pertinent negatives for hypoglycemia include no headaches. Pertinent negatives for diabetes include no blurred vision, no chest pain, no fatigue, no foot ulcerations, no polydipsia, no polyphagia, no polyuria and no weakness. There are no hypoglycemic complications. There are no diabetic complications. Risk factors for coronary artery disease include dyslipidemia, male sex and hypertension. Current diabetic treatment includes oral agent (monotherapy). He is compliant with treatment all of the time. His weight is stable. He is following a generally healthy, low fat/cholesterol and low salt diet. When asked about meal planning, he reported none. He participates in exercise three times a week. His breakfast blood glucose is taken between 8-9 am. His breakfast blood glucose range is generally >200 mg/dl. His overall blood glucose range is >200 mg/dl. An ACE inhibitor/angiotensin II receptor blocker is being taken. He does not see a podiatrist.Eye exam is current.  Hypertension This is a chronic problem. The current episode started more than 1 year ago. The problem has been gradually improving since onset. The problem is controlled. Pertinent negatives include no blurred vision, chest pain, headaches, palpitations, peripheral edema or shortness of breath. There are no associated agents to hypertension. Risk factors for coronary artery disease include diabetes mellitus and male gender. Past treatments include ACE inhibitors. The current treatment provides moderate improvement. There are no compliance problems.   Hyperlipidemia This is a chronic problem. The current episode started more than 1  year ago. The problem is uncontrolled. Recent lipid tests were reviewed and are high. Exacerbating diseases include diabetes. There are no known factors aggravating his hyperlipidemia. Pertinent negatives include no chest pain, leg pain, myalgias or shortness of breath. Current antihyperlipidemic treatment includes statins and ezetimibe. The current treatment provides mild improvement of lipids. There are no compliance problems.  Risk factors for coronary artery disease include diabetes mellitus, hypertension and male sex.   * since last visit patient injured knee and had to have arthroscopy of right knee for torn meniscus. Had surgery November 3,2014- since then his blood sugar has been high.   Review of Systems  Constitutional: Negative for fatigue.  HENT: Negative.   Eyes: Negative.  Negative for blurred vision.  Respiratory: Negative for shortness of breath.   Cardiovascular: Negative for chest pain and palpitations.  Gastrointestinal: Negative.   Endocrine: Negative for polydipsia, polyphagia and polyuria.  Genitourinary: Negative.   Musculoskeletal: Negative for myalgias.  Neurological: Negative for weakness and headaches.  Psychiatric/Behavioral: Negative.        Objective:   Physical Exam  Constitutional: He is oriented to person, place, and time. He appears well-developed and well-nourished.  HENT:  Head: Normocephalic.  Right Ear: External ear normal.  Left Ear: External ear normal.  Nose: Nose normal.  Mouth/Throat: Oropharynx is clear and moist.  Eyes: EOM are normal. Pupils are equal, round, and reactive to light.  Neck: Normal range of motion. Neck supple. No thyromegaly present.  Cardiovascular: Normal rate, regular rhythm, normal heart sounds and intact distal pulses.   No murmur heard. Pulmonary/Chest: Effort normal and breath sounds normal. He has no wheezes. He has no rales.  Abdominal: Soft. Bowel sounds are normal.  Musculoskeletal: Normal range of motion.  Neurological: He is alert and oriented to person, place, and time.  Skin: Skin is warm and dry.     Psychiatric: He has a normal mood and affect. His behavior is normal. Judgment and thought content normal.    BP 147/83  Pulse 66  Temp(Src) 97.1 F (36.2 C) (Oral)  Ht '5\' 5"'  (1.651 m)  Wt 172 lb 8 oz (78.245 kg)  BMI 28.71 kg/m2     Results for orders placed in visit on 02/17/13  POCT GLYCOSYLATED HEMOGLOBIN (HGB A1C)      Result Value Range   Hemoglobin A1C 9.0%      Assessment & Plan:   1. Diabetes mellitus   2. Hyperlipidemia   3. Hypertension    Orders Placed This Encounter  Procedures  . CMP14+EGFR  . NMR, lipoprofile  . POCT glycosylated hemoglobin (Hb A1C)  . POCT UA - Microalbumin   Meds ordered this encounter  Medications  . glimepiride (AMARYL) 4 MG tablet    Sig: Take 1 tablet (4 mg total) by mouth daily before breakfast.    Dispense:  30 tablet    Refill:  3    Order Specific Question:  Supervising Provider    Answer:  Joycelyn Man   Added amaryl to meds Keep diary of blood sugar Health maintenance reviewed Diet and exercise encouraged Continue all meds Follow up  In 3 months   [place], FNP

## 2013-02-18 LAB — CMP14+EGFR
A/G RATIO: 2 (ref 1.1–2.5)
ALT: 52 IU/L — AB (ref 0–44)
AST: 40 IU/L (ref 0–40)
Albumin: 4.7 g/dL (ref 3.6–4.8)
Alkaline Phosphatase: 73 IU/L (ref 39–117)
BILIRUBIN TOTAL: 0.5 mg/dL (ref 0.0–1.2)
BUN / CREAT RATIO: 9 — AB (ref 10–22)
BUN: 8 mg/dL (ref 8–27)
CO2: 26 mmol/L (ref 18–29)
Calcium: 9.5 mg/dL (ref 8.6–10.2)
Chloride: 94 mmol/L — ABNORMAL LOW (ref 97–108)
Creatinine, Ser: 0.87 mg/dL (ref 0.76–1.27)
GFR, EST AFRICAN AMERICAN: 108 mL/min/{1.73_m2} (ref >59)
GFR, EST NON AFRICAN AMERICAN: 93 mL/min/{1.73_m2} (ref >59)
GLOBULIN, TOTAL: 2.3 g/dL (ref 1.5–4.5)
Glucose: 194 mg/dL — ABNORMAL HIGH (ref 65–99)
POTASSIUM: 5.1 mmol/L (ref 3.5–5.2)
SODIUM: 138 mmol/L (ref 134–144)
Total Protein: 7 g/dL (ref 6.0–8.5)

## 2013-02-18 LAB — NMR, LIPOPROFILE
Cholesterol: 124 mg/dL (ref <200)
HDL Cholesterol by NMR: 40 mg/dL (ref >=40)
HDL PARTICLE NUMBER: 33.4 umol/L (ref >=30.5)
LDL Particle Number: 946 nmol/L (ref <1000)
LDL SIZE: 20.2 nm — AB (ref >20.5)
LDLC SERPL CALC-MCNC: 58 mg/dL (ref <100)
LP-IR Score: 75 — ABNORMAL HIGH (ref <=45)
SMALL LDL PARTICLE NUMBER: 585 nmol/L — AB (ref <=527)
Triglycerides by NMR: 128 mg/dL (ref <150)

## 2013-02-18 LAB — MICROALBUMIN, URINE: MICROALBUM., U, RANDOM: 46.2 ug/mL — AB (ref 0.0–17.0)

## 2013-03-09 ENCOUNTER — Other Ambulatory Visit: Payer: BC Managed Care – PPO

## 2013-03-09 DIAGNOSIS — Z1212 Encounter for screening for malignant neoplasm of rectum: Secondary | ICD-10-CM

## 2013-03-10 LAB — FECAL OCCULT BLOOD, IMMUNOCHEMICAL: Fecal Occult Bld: NEGATIVE

## 2013-04-12 ENCOUNTER — Other Ambulatory Visit: Payer: Self-pay | Admitting: Nurse Practitioner

## 2013-04-21 ENCOUNTER — Other Ambulatory Visit: Payer: Self-pay | Admitting: *Deleted

## 2013-04-21 DIAGNOSIS — E119 Type 2 diabetes mellitus without complications: Secondary | ICD-10-CM

## 2013-04-21 MED ORDER — METFORMIN HCL 1000 MG PO TABS: 1000.0000 mg | ORAL_TABLET | Freq: Two times a day (BID) | ORAL | Status: DC

## 2013-05-03 ENCOUNTER — Other Ambulatory Visit: Payer: Self-pay | Admitting: *Deleted

## 2013-05-03 MED ORDER — AMLODIPINE BESY-BENAZEPRIL HCL 5-20 MG PO CAPS: ORAL_CAPSULE | ORAL | Status: DC

## 2013-05-20 ENCOUNTER — Encounter: Payer: Self-pay | Admitting: Nurse Practitioner

## 2013-05-20 ENCOUNTER — Ambulatory Visit (INDEPENDENT_AMBULATORY_CARE_PROVIDER_SITE_OTHER): Payer: BC Managed Care – PPO | Admitting: Nurse Practitioner

## 2013-05-20 VITALS — BP 156/88 | HR 65 | Temp 98.2°F | Ht 65.0 in | Wt 175.4 lb

## 2013-05-20 DIAGNOSIS — Z0000 Encounter for general adult medical examination without abnormal findings: Secondary | ICD-10-CM

## 2013-05-20 DIAGNOSIS — E119 Type 2 diabetes mellitus without complications: Secondary | ICD-10-CM

## 2013-05-20 DIAGNOSIS — I10 Essential (primary) hypertension: Secondary | ICD-10-CM

## 2013-05-20 DIAGNOSIS — E785 Hyperlipidemia, unspecified: Secondary | ICD-10-CM

## 2013-05-20 LAB — POCT GLYCOSYLATED HEMOGLOBIN (HGB A1C): HEMOGLOBIN A1C: 6.3

## 2013-05-20 MED ORDER — METFORMIN HCL 1000 MG PO TABS: 1000.0000 mg | ORAL_TABLET | Freq: Two times a day (BID) | ORAL | Status: DC

## 2013-05-20 MED ORDER — GLIMEPIRIDE 4 MG PO TABS: 4.0000 mg | ORAL_TABLET | Freq: Every day | ORAL | Status: DC

## 2013-05-20 NOTE — Progress Notes (Signed)
Subjective:    Patient ID: Rodney Glover, male    DOB: [birth year], 63 y.o.   MRN: [id number]  Patient presents today for follow-up of chronic medical problems - No changes or new complaints. Diabetes He presents for his follow-up diabetic visit. He has type 2 diabetes mellitus. The initial diagnosis of diabetes was made 10 years ago. His disease course has been improving. There are no hypoglycemic associated symptoms. Pertinent negatives for hypoglycemia include no headaches. Pertinent negatives for diabetes include no blurred vision, no chest pain, no fatigue, no foot ulcerations, no polydipsia, no polyphagia, no polyuria and no weakness. There are no hypoglycemic complications. There are no diabetic complications. Risk factors for coronary artery disease include dyslipidemia, male sex and hypertension. Current diabetic treatment includes oral agent (monotherapy) (Walks about 15 miles per walk playing golf). He is compliant with treatment all of the time. His weight is stable. He is following a generally healthy, low fat/cholesterol and low salt diet. When asked about meal planning, he reported none. He participates in exercise three times a week. His breakfast blood glucose is taken between 8-9 am. His breakfast blood glucose range is generally 90-110 mg/dl. His overall blood glucose range is 90-110 mg/dl. An ACE inhibitor/angiotensin II receptor blocker is being taken. He does not see a podiatrist.Eye exam is current.  Hypertension This is a chronic problem. The current episode started more than 1 year ago. The problem has been gradually improving since onset. The problem is controlled. Pertinent negatives include no blurred vision, chest pain, headaches, palpitations, peripheral edema or shortness of breath. There are no associated agents to hypertension. Risk factors for coronary artery disease include diabetes mellitus and male gender. Past treatments include ACE inhibitors. The current treatment  provides moderate improvement. There are no compliance problems.   Hyperlipidemia This is a chronic problem. The current episode started more than 1 year ago. The problem is uncontrolled. Recent lipid tests were reviewed and are high. Exacerbating diseases include diabetes. There are no known factors aggravating his hyperlipidemia. Pertinent negatives include no chest pain, leg pain, myalgias or shortness of breath. Current antihyperlipidemic treatment includes statins and ezetimibe. The current treatment provides mild improvement of lipids. There are no compliance problems.  Risk factors for coronary artery disease include diabetes mellitus, hypertension and male sex.     Review of Systems  Constitutional: Negative for fatigue.  HENT: Negative.   Eyes: Negative.  Negative for blurred vision.  Respiratory: Negative for shortness of breath.   Cardiovascular: Negative for chest pain and palpitations.  Gastrointestinal: Negative.   Endocrine: Negative for polydipsia, polyphagia and polyuria.  Genitourinary: Negative.   Musculoskeletal: Negative for myalgias.  Neurological: Negative for weakness and headaches.  Psychiatric/Behavioral: Negative.   All other systems reviewed and are negative.      Objective:   Physical Exam  Constitutional: He is oriented to person, place, and time. He appears well-developed and well-nourished.  HENT:  Head: Normocephalic.  Right Ear: External ear normal.  Left Ear: External ear normal.  Nose: Nose normal.  Mouth/Throat: Oropharynx is clear and moist.  Eyes: EOM are normal. Pupils are equal, round, and reactive to light.  Neck: Normal range of motion. Neck supple. No thyromegaly present.  Cardiovascular: Normal rate, regular rhythm, normal heart sounds and intact distal pulses.   No murmur heard. Pulmonary/Chest: Effort normal and breath sounds normal. He has no wheezes. He has no rales.  Abdominal: Soft. Bowel sounds are normal.  Musculoskeletal:  Normal range of motion.  Neurological: He is alert and oriented to person, place, and time.  Skin: Skin is warm and dry.     Psychiatric: He has a normal mood and affect. His behavior is normal. Judgment and thought content normal.    BP 156/88  Pulse 65  Temp(Src) 98.2 F (36.8 C) (Oral)  Ht '5\' 5"'  (1.651 m)  Wt 175 lb 6.4 oz (79.561 kg)  BMI 29.19 kg/m2   Results for orders placed in visit on 05/20/13  POCT GLYCOSYLATED HEMOGLOBIN (HGB A1C)      Result Value Ref Range   Hemoglobin A1C 6.3       PLAN:  1. Diabetes mellitus   2. Hyperlipidemia   3. Hypertension   4. Health care maintenance    Orders Placed This Encounter  Procedures  . CMP14+EGFR  . NMR, lipoprofile  . PSA, total and free  . POCT glycosylated hemoglobin (Hb A1C)   Meds ordered this encounter  Medications  . DISCONTD: metFORMIN (GLUCOPHAGE) 1000 MG tablet    Sig: Take 1 tablet (1,000 mg total) by mouth 2 (two) times daily with a meal.    Dispense:  60 tablet    Refill:  0    Order Specific Question:  Supervising Provider    Answer:  Chipper Herb [[id number]]  . glimepiride (AMARYL) 4 MG tablet    Sig: Take 1 tablet (4 mg total) by mouth daily before breakfast.    Dispense:  30 tablet    Refill:  3    Order Specific Question:  Supervising Provider    Answer:  Chipper Herb [[id number]]  . metFORMIN (GLUCOPHAGE) 1000 MG tablet    Sig: Take 1 tablet (1,000 mg total) by mouth 2 (two) times daily with a meal.    Dispense:  60 tablet    Refill:  5    Order Specific Question:  Supervising Provider    Answer:  Chipper Herb [[id number]]   Pt is to monitor BP at home and will let me know if BP is > 140/90 Labs pending Health maintenance reviewed Diet and exercise encouraged Continue all meds Follow up  In 3 months   [place], FNP

## 2013-05-20 NOTE — Patient Instructions (Signed)

## 2013-05-21 LAB — CMP14+EGFR
ALK PHOS: 54 IU/L (ref 39–117)
ALT: 34 IU/L (ref 0–44)
AST: 34 IU/L (ref 0–40)
Albumin/Globulin Ratio: 2.4 (ref 1.1–2.5)
Albumin: 4.3 g/dL (ref 3.6–4.8)
BUN / CREAT RATIO: 12 (ref 10–22)
BUN: 9 mg/dL (ref 8–27)
CALCIUM: 9.2 mg/dL (ref 8.6–10.2)
CHLORIDE: 101 mmol/L (ref 97–108)
CO2: 25 mmol/L (ref 18–29)
Creatinine, Ser: 0.77 mg/dL (ref 0.76–1.27)
GFR calc Af Amer: 112 mL/min/{1.73_m2} (ref >59)
GFR calc non Af Amer: 97 mL/min/{1.73_m2} (ref >59)
Globulin, Total: 1.8 g/dL (ref 1.5–4.5)
Glucose: 104 mg/dL — ABNORMAL HIGH (ref 65–99)
POTASSIUM: 4.5 mmol/L (ref 3.5–5.2)
Sodium: 141 mmol/L (ref 134–144)
Total Bilirubin: 0.3 mg/dL (ref 0.0–1.2)
Total Protein: 6.1 g/dL (ref 6.0–8.5)

## 2013-05-21 LAB — NMR, LIPOPROFILE
Cholesterol: 121 mg/dL (ref <200)
HDL Cholesterol by NMR: 44 mg/dL (ref >=40)
HDL Particle Number: 38 umol/L (ref >=30.5)
LDL Particle Number: 764 nmol/L (ref <1000)
LDL Size: 20.3 nm (ref >20.5)
LDLC SERPL CALC-MCNC: 64 mg/dL (ref <100)
LP-IR Score: 53 — ABNORMAL HIGH (ref <=45)
Small LDL Particle Number: 482 nmol/L (ref <=527)
Triglycerides by NMR: 67 mg/dL (ref <150)

## 2013-05-22 LAB — PSA, TOTAL AND FREE
PSA, Free Pct: 30 %
PSA, Free: 0.12 ng/mL
PSA: 0.4 ng/mL (ref 0.0–4.0)

## 2013-05-22 LAB — SPECIMEN STATUS REPORT

## 2013-07-27 ENCOUNTER — Other Ambulatory Visit: Payer: Self-pay | Admitting: Nurse Practitioner

## 2013-08-07 ENCOUNTER — Other Ambulatory Visit: Payer: Self-pay | Admitting: Nurse Practitioner

## 2013-09-14 ENCOUNTER — Ambulatory Visit (INDEPENDENT_AMBULATORY_CARE_PROVIDER_SITE_OTHER): Payer: BC Managed Care – PPO | Admitting: Nurse Practitioner

## 2013-09-14 ENCOUNTER — Encounter: Payer: Self-pay | Admitting: Nurse Practitioner

## 2013-09-14 ENCOUNTER — Telehealth: Payer: Self-pay | Admitting: Nurse Practitioner

## 2013-09-14 VITALS — BP 136/77 | HR 81 | Temp 97.9°F | Ht 65.0 in | Wt 171.0 lb

## 2013-09-14 DIAGNOSIS — E119 Type 2 diabetes mellitus without complications: Secondary | ICD-10-CM

## 2013-09-14 DIAGNOSIS — I10 Essential (primary) hypertension: Secondary | ICD-10-CM

## 2013-09-14 DIAGNOSIS — E785 Hyperlipidemia, unspecified: Secondary | ICD-10-CM

## 2013-09-14 LAB — POCT GLYCOSYLATED HEMOGLOBIN (HGB A1C): Hemoglobin A1C: 5.9

## 2013-09-14 MED ORDER — EZETIMIBE 10 MG PO TABS: ORAL_TABLET | ORAL | Status: DC

## 2013-09-14 MED ORDER — GLIMEPIRIDE 4 MG PO TABS: 4.0000 mg | ORAL_TABLET | Freq: Every day | ORAL | Status: DC

## 2013-09-14 MED ORDER — METFORMIN HCL 1000 MG PO TABS: 1000.0000 mg | ORAL_TABLET | Freq: Two times a day (BID) | ORAL | Status: DC

## 2013-09-14 MED ORDER — GLUCOSE BLOOD VI STRP: ORAL_STRIP | Status: DC

## 2013-09-14 MED ORDER — ROSUVASTATIN CALCIUM 20 MG PO TABS: ORAL_TABLET | ORAL | Status: DC

## 2013-09-14 MED ORDER — AMLODIPINE BESY-BENAZEPRIL HCL 5-20 MG PO CAPS: ORAL_CAPSULE | ORAL | Status: DC

## 2013-09-14 NOTE — Telephone Encounter (Signed)
Was on normal instead of print and order went to [facility]- cancelled order- prited new order for him to pick up.

## 2013-09-14 NOTE — Progress Notes (Signed)
Subjective:    Patient ID: Rodney Glover, male    DOB: 11-Jan-1951, 63 y.o.   MRN: [id number]  Patient presents today for follow-up of chronic medical problems - Having several episodes of hypoglycemia with blood sugars running in the am of 90-100.  Has had an episode of feeling faint, weak and checked blood sugar with it being 57 while [place] the golf [place].  Ate some cookies with blood sugar returning to normal at that time. Diabetes He presents for his follow-up diabetic visit. He has type 2 diabetes mellitus. The initial diagnosis of diabetes was made 10 years ago. His disease course has been improving. There are no hypoglycemic associated symptoms. Pertinent negatives for diabetes include no blurred vision and no foot ulcerations. There are no hypoglycemic complications. There are no diabetic complications. Risk factors for coronary artery disease include dyslipidemia, male sex and hypertension. Current diabetic treatment includes oral agent (monotherapy) (Walks about 15 miles per walk playing golf). He is compliant with treatment all of the time. His weight is stable. He is following a generally healthy, low fat/cholesterol and low salt diet. When asked about meal planning, he reported none. He participates in exercise three times a week. His breakfast blood glucose is taken between 8-9 am. His breakfast blood glucose range is generally 90-110 mg/dl. His overall blood glucose range is 90-110 mg/dl. An ACE inhibitor/angiotensin II receptor blocker is being taken. He does not see a podiatrist.Eye exam is current.  Hypertension This is a chronic problem. The current episode started more than 1 year ago. The problem has been gradually improving since onset. The problem is controlled. Pertinent negatives include no blurred vision or peripheral edema. There are no associated agents to hypertension. Risk factors for coronary artery disease include diabetes mellitus and male gender. Past treatments include ACE  inhibitors. The current treatment provides moderate improvement. There are no compliance problems.   Hyperlipidemia This is a chronic problem. The current episode started more than 1 year ago. The problem is uncontrolled. Recent lipid tests were reviewed and are high. Exacerbating diseases include diabetes. There are no known factors aggravating his hyperlipidemia. Pertinent negatives include no leg pain. Current antihyperlipidemic treatment includes statins and ezetimibe. The current treatment provides mild improvement of lipids. There are no compliance problems.  Risk factors for coronary artery disease include diabetes mellitus, hypertension and male sex.     Review of Systems  Constitutional: Negative.   Eyes: Negative for blurred vision.  Respiratory: Negative.   Cardiovascular: Negative.   Skin: Negative.   Psychiatric/Behavioral: Negative.        Objective:   Physical Exam  Constitutional: He is oriented to person, place, and time. He appears well-developed and well-nourished.  HENT:  Left Ear: External ear normal.  Nose: Nose normal.  Cardiovascular: Normal rate and regular rhythm.   Pulmonary/Chest: Effort normal.  Abdominal: Bowel sounds are normal.  Neurological: He is alert and oriented to person, place, and time.  Skin: Skin is warm and dry.     Psychiatric: He has a normal mood and affect. His behavior is normal. Judgment and thought content normal.    BP 136/77  Pulse 81  Temp(Src) 97.9 F (36.6 C) (Oral)  Ht 5' 5" (1.651 m)  Wt 171 lb (77.565 kg)  BMI 28.46 kg/m2   Results for orders placed in visit on 09/14/13  POCT GLYCOSYLATED HEMOGLOBIN (HGB A1C)      Result Value Ref Range   Hemoglobin A1C 5.9  ASSESSMENT/PLAN:  1. Type 2 diabetes mellitus without complication   2. Hyperlipidemia   3. Essential hypertension    Orders Placed This Encounter  Procedures  . CMP14+EGFR  . NMR, lipoprofile  . POCT glycosylated hemoglobin (Hb A1C)   Meds  ordered this encounter  Medications  . metFORMIN (GLUCOPHAGE) 1000 MG tablet    Sig: Take 1 tablet (1,000 mg total) by mouth 2 (two) times daily with a meal.    Dispense:  180 tablet    Refill:  1    Order Specific Question:  Supervising Provider    Answer:  Chipper Herb [[id number]]  . glimepiride (AMARYL) 4 MG tablet    Sig: Take 1 tablet (4 mg total) by mouth daily before breakfast.    Dispense:  90 tablet    Refill:  1    Order Specific Question:  Supervising Provider    Answer:  Chipper Herb [[id number]]  . ezetimibe (ZETIA) 10 MG tablet    Sig: TAKE 1 TABLET (10 MG TOTAL) BY MOUTH DAILY.    Dispense:  90 tablet    Refill:  1    Order Specific Question:  Supervising Provider    Answer:  Chipper Herb [[id number]]  . glucose blood (ACCU-CHEK AVIVA PLUS) test strip    Sig: Test 1x a day- dx 250.02    Dispense:  300 each    Refill:  11    Order Specific Question:  Supervising Provider    Answer:  Chipper Herb [[id number]]  . rosuvastatin (CRESTOR) 20 MG tablet    Sig: TAKE 1 TABLET BY MOUTH DAILY    Dispense:  90 tablet    Refill:  1    Order Specific Question:  Supervising Provider    Answer:  Chipper Herb [[id number]]  . amLODipine-benazepril (LOTREL) 5-20 MG per capsule    Sig: TAKE 1 CAPSULE EVERY DAY    Dispense:  90 capsule    Refill:  1    Order Specific Question:  Supervising Provider    Answer:  Chipper Herb [[id number]]     If frequency of hypogylcemia continues to u=increase then you can take 1/2 of amaryl rx.- and let me know at next appointment. Labs pending Health maintenance reviewed Diet and exercise encouraged Continue all meds Follow up  In 3 months   [place], FNP

## 2013-09-14 NOTE — Patient Instructions (Signed)
Diabetes and Foot Care Diabetes may cause you to have problems because of poor blood supply (circulation) to your feet and legs. This may cause the skin on your feet to become thinner, break easier, and heal more slowly. Your skin may become dry, and the skin may peel and crack. You may also have nerve damage in your legs and feet causing decreased feeling in them. You may not notice minor injuries to your feet that could lead to infections or more serious problems. Taking care of your feet is one of the most important things you can do for yourself.  HOME CARE INSTRUCTIONS  Wear shoes at all times, even in the house. Do not go barefoot. Bare feet are easily injured.  Check your feet daily for blisters, cuts, and redness. If you cannot see the bottom of your feet, use a mirror or ask someone for help.  Wash your feet with warm water (do not use hot water) and mild soap. Then pat your feet and the areas between your toes until they are completely dry. Do not soak your feet as this can dry your skin.  Apply a moisturizing lotion or petroleum jelly (that does not contain alcohol and is unscented) to the skin on your feet and to dry, brittle toenails. Do not apply lotion between your toes.  Trim your toenails straight across. Do not dig under them or around the cuticle. File the edges of your nails with an emery board or nail file.  Do not cut corns or calluses or try to remove them with medicine.  Wear clean socks or stockings every day. Make sure they are not too tight. Do not wear knee-high stockings since they may decrease blood flow to your legs.  Wear shoes that fit properly and have enough cushioning. To break in new shoes, wear them for just a few hours a day. This prevents you from injuring your feet. Always look in your shoes before you put them on to be sure there are no objects inside.  Do not cross your legs. This may decrease the blood flow to your feet.  If you find a minor scrape,  cut, or break in the skin on your feet, keep it and the skin around it clean and dry. These areas may be cleansed with mild soap and water. Do not cleanse the area with peroxide, alcohol, or iodine.  When you remove an adhesive bandage, be sure not to damage the skin around it.  If you have a wound, look at it several times a day to make sure it is healing.  Do not use heating pads or hot water bottles. They may burn your skin. If you have lost feeling in your feet or legs, you may not know it is happening until it is too late.  Make sure your health care provider performs a complete foot exam at least annually or more often if you have foot problems. Report any cuts, sores, or bruises to your health care provider immediately. SEEK MEDICAL CARE IF:   You have an injury that is not healing.  You have cuts or breaks in the skin.  You have an ingrown nail.  You notice redness on your legs or feet.  You feel burning or tingling in your legs or feet.  You have pain or cramps in your legs and feet.  Your legs or feet are numb.  Your feet always feel cold. SEEK IMMEDIATE MEDICAL CARE IF:   There is increasing redness,   swelling, or pain in or around a wound.  There is a red line that goes up your leg.  Pus is coming from a wound.  You develop a fever or as directed by your health care provider.  You notice a bad smell coming from an ulcer or wound. Document Released: 01/19/2000 Document Revised: 09/23/2012 Document Reviewed: 06/30/2012 Exit[employer] Patient Information 2015 [employer]. This information is not intended to replace advice given to you by your health care provider. Make sure you discuss any questions you have with your health care provider.  

## 2013-09-14 NOTE — Telephone Encounter (Signed)
He called to let Rodney Glover know that one of his medicines wasn't on the list she printed today.  He said that [facility] would probably fax a refill request for Glymetiride.

## 2013-09-15 LAB — CMP14+EGFR
A/G RATIO: 1.8 (ref 1.1–2.5)
ALBUMIN: 4.3 g/dL (ref 3.6–4.8)
ALT: 27 IU/L (ref 0–44)
AST: 35 IU/L (ref 0–40)
Alkaline Phosphatase: 59 IU/L (ref 39–117)
BUN / CREAT RATIO: 12 (ref 10–22)
BUN: 11 mg/dL (ref 8–27)
CO2: 25 mmol/L (ref 18–29)
Calcium: 9.1 mg/dL (ref 8.6–10.2)
Chloride: 99 mmol/L (ref 97–108)
Creatinine, Ser: 0.89 mg/dL (ref 0.76–1.27)
GFR calc Af Amer: 106 mL/min/{1.73_m2} (ref >59)
GFR, EST NON AFRICAN AMERICAN: 92 mL/min/{1.73_m2} (ref >59)
Globulin, Total: 2.4 g/dL (ref 1.5–4.5)
Glucose: 107 mg/dL — ABNORMAL HIGH (ref 65–99)
Potassium: 5.4 mmol/L — ABNORMAL HIGH (ref 3.5–5.2)
SODIUM: 138 mmol/L (ref 134–144)
Total Bilirubin: 0.4 mg/dL (ref 0.0–1.2)
Total Protein: 6.7 g/dL (ref 6.0–8.5)

## 2013-09-15 LAB — NMR, LIPOPROFILE
CHOLESTEROL: 123 mg/dL (ref 100–199)
HDL CHOLESTEROL BY NMR: 45 mg/dL (ref >39)
HDL PARTICLE NUMBER: 39.7 umol/L (ref >=30.5)
LDL Particle Number: 833 nmol/L (ref <1000)
LDL Size: 20.7 nm (ref >20.5)
LDLC SERPL CALC-MCNC: 67 mg/dL (ref 0–99)
LP-IR Score: 59 — ABNORMAL HIGH (ref <=45)
SMALL LDL PARTICLE NUMBER: 514 nmol/L (ref <=527)
Triglycerides by NMR: 55 mg/dL (ref 0–149)

## 2013-09-15 NOTE — Telephone Encounter (Signed)
Patient aware rx up front to be picked up

## 2013-10-04 ENCOUNTER — Other Ambulatory Visit: Payer: Self-pay | Admitting: Nurse Practitioner

## 2013-10-08 ENCOUNTER — Encounter: Payer: Self-pay | Admitting: *Deleted

## 2013-10-09 ENCOUNTER — Other Ambulatory Visit: Payer: Self-pay | Admitting: Nurse Practitioner

## 2013-11-10 ENCOUNTER — Encounter: Payer: Self-pay | Admitting: Internal Medicine

## 2014-01-13 ENCOUNTER — Ambulatory Visit (INDEPENDENT_AMBULATORY_CARE_PROVIDER_SITE_OTHER): Payer: BC Managed Care – PPO | Admitting: Nurse Practitioner

## 2014-01-13 ENCOUNTER — Encounter: Payer: Self-pay | Admitting: Nurse Practitioner

## 2014-01-13 VITALS — BP 136/85 | HR 64 | Temp 97.8°F | Ht 65.0 in | Wt 173.0 lb

## 2014-01-13 DIAGNOSIS — I10 Essential (primary) hypertension: Secondary | ICD-10-CM

## 2014-01-13 DIAGNOSIS — Z23 Encounter for immunization: Secondary | ICD-10-CM

## 2014-01-13 DIAGNOSIS — E119 Type 2 diabetes mellitus without complications: Secondary | ICD-10-CM

## 2014-01-13 DIAGNOSIS — E785 Hyperlipidemia, unspecified: Secondary | ICD-10-CM

## 2014-01-13 LAB — POCT GLYCOSYLATED HEMOGLOBIN (HGB A1C): Hemoglobin A1C: 6.4

## 2014-01-13 NOTE — Addendum Note (Signed)
Addended by: Rolena Infante on: 01/13/2014 11:34 AM   Modules accepted: Orders

## 2014-01-13 NOTE — Progress Notes (Signed)
Subjective:    Patient ID: Rodney Glover, male    DOB: 08/11/1950, 63 y.o.   MRN: [id number]  Patient presents today for follow-up of chronic medical problems - Having several episodes of hypoglycemia with blood sugars running in the am of 90-100.  Has had an episode of feeling faint, weak and checked blood sugar with it being 57 [place].  Ate some cookies with blood sugar returning to normal at that time. Diabetes He presents for his follow-up diabetic visit. He has type 2 diabetes mellitus. His disease course has been stable. Symptoms are stable. Risk factors for coronary artery disease include dyslipidemia, diabetes mellitus, hypertension and male sex. Current diabetic treatment includes oral agent (dual therapy). He is compliant with treatment most of the time. His weight is stable. He is following a diabetic diet. He has not had a previous visit with a dietitian. He participates in exercise daily. There is no change in his home blood glucose trend. His breakfast blood glucose is taken between 8-9 am. His breakfast blood glucose range is generally 110-130 mg/dl. His overall blood glucose range is 110-130 mg/dl. An ACE inhibitor/angiotensin II receptor blocker is being taken. Eye exam is not current.  Hypertension This is a chronic problem. The current episode started more than 1 year ago. The problem is controlled. Risk factors for coronary artery disease include diabetes mellitus, dyslipidemia and male gender. Past treatments include calcium channel blockers and ACE inhibitors. The current treatment provides moderate improvement. There are no compliance problems.   Hyperlipidemia This is a chronic problem. The current episode started more than 1 year ago. The problem is controlled. Recent lipid tests were reviewed and are normal. There are no known factors aggravating his hyperlipidemia. Current antihyperlipidemic treatment includes ezetimibe and statins. The current treatment  provides significant improvement of lipids. There are no compliance problems.  Risk factors for coronary artery disease include diabetes mellitus, dyslipidemia, hypertension and male sex.     Review of Systems  Constitutional: Negative.   Respiratory: Negative.   Cardiovascular: Negative.   Skin: Negative.   Psychiatric/Behavioral: Negative.        Objective:   Physical Exam  Constitutional: He is oriented to person, place, and time. He appears well-developed and well-nourished.  HENT:  Head: Normocephalic.  Right Ear: External ear normal.  Left Ear: External ear normal.  Nose: Nose normal.  Mouth/Throat: Oropharynx is clear and moist.  Eyes: EOM are normal. Pupils are equal, round, and reactive to light.  Neck: Normal range of motion. Neck supple. No JVD present. No thyromegaly present.  Cardiovascular: Normal rate, regular rhythm, normal heart sounds and intact distal pulses.  Exam reveals no gallop and no friction rub.   No murmur heard. Pulmonary/Chest: Effort normal and breath sounds normal. No respiratory distress. He has no wheezes. He has no rales. He exhibits no tenderness.  Abdominal: Soft. Bowel sounds are normal. He exhibits no mass. There is no tenderness.  Musculoskeletal: Normal range of motion. He exhibits no edema.  Lymphadenopathy:    He has no cervical adenopathy.  Neurological: He is alert and oriented to person, place, and time. No cranial nerve deficit.  Skin: Skin is warm and dry.  Psychiatric: He has a normal mood and affect. His behavior is normal. Judgment and thought content normal.    BP 136/85 mmHg  Pulse 64  Temp(Src) 97.8 F (36.6 C) (Oral)  Ht 5' 5" (1.651 m)  Wt 173 lb (78.472 kg)  BMI 28.79  kg/m2  / Results for orders placed or performed in visit on 01/13/14  POCT glycosylated hemoglobin (Hb A1C)  Result Value Ref Range   Hemoglobin A1C 6.4%        ASSESSMENT/PLAN:  1. Type 2 diabetes mellitus without complication Continue to  watch carbs in diet - POCT glycosylated hemoglobin (Hb A1C)  2. Hyperlipidemia Low fat diet - NMR, lipoprofile  3. Essential hypertension Do not add salt to diet - CMP14+EGFR   prevnar 13 today Labs pending Health maintenance reviewed Diet and exercise encouraged Continue all meds Follow up  In 3 month   [place], FNP

## 2014-01-13 NOTE — Patient Instructions (Signed)

## 2014-01-14 LAB — NMR, LIPOPROFILE
CHOLESTEROL: 122 mg/dL (ref 100–199)
HDL Cholesterol by NMR: 46 mg/dL (ref >39)
HDL PARTICLE NUMBER: 38.9 umol/L (ref >=30.5)
LDL Particle Number: 832 nmol/L (ref <1000)
LDL Size: 20.3 nm (ref >20.5)
LDL-C: 64 mg/dL (ref 0–99)
LP-IR Score: 45 (ref <=45)
SMALL LDL PARTICLE NUMBER: 453 nmol/L (ref <=527)
TRIGLYCERIDES BY NMR: 60 mg/dL (ref 0–149)

## 2014-01-14 LAB — CMP14+EGFR
A/G RATIO: 2 (ref 1.1–2.5)
ALT: 33 IU/L (ref 0–44)
AST: 37 IU/L (ref 0–40)
Albumin: 4.3 g/dL (ref 3.6–4.8)
Alkaline Phosphatase: 57 IU/L (ref 39–117)
BILIRUBIN TOTAL: 0.6 mg/dL (ref 0.0–1.2)
BUN/Creatinine Ratio: 13 (ref 10–22)
BUN: 12 mg/dL (ref 8–27)
CO2: 26 mmol/L (ref 18–29)
Calcium: 9.1 mg/dL (ref 8.6–10.2)
Chloride: 101 mmol/L (ref 97–108)
Creatinine, Ser: 0.89 mg/dL (ref 0.76–1.27)
GFR, EST AFRICAN AMERICAN: 106 mL/min/{1.73_m2} (ref >59)
GFR, EST NON AFRICAN AMERICAN: 92 mL/min/{1.73_m2} (ref >59)
GLOBULIN, TOTAL: 2.2 g/dL (ref 1.5–4.5)
Glucose: 100 mg/dL — ABNORMAL HIGH (ref 65–99)
Potassium: 4.8 mmol/L (ref 3.5–5.2)
Sodium: 141 mmol/L (ref 134–144)
TOTAL PROTEIN: 6.5 g/dL (ref 6.0–8.5)

## 2014-01-19 IMAGING — CR DG KNEE 1-2V*R*
2 series · 2 of 2 positions shown · non-contrast
Comparison: None.

CLINICAL DATA: Right knee pain

RIGHT KNEE - 1-2 VIEW

[view not recorded (1 of 2)]
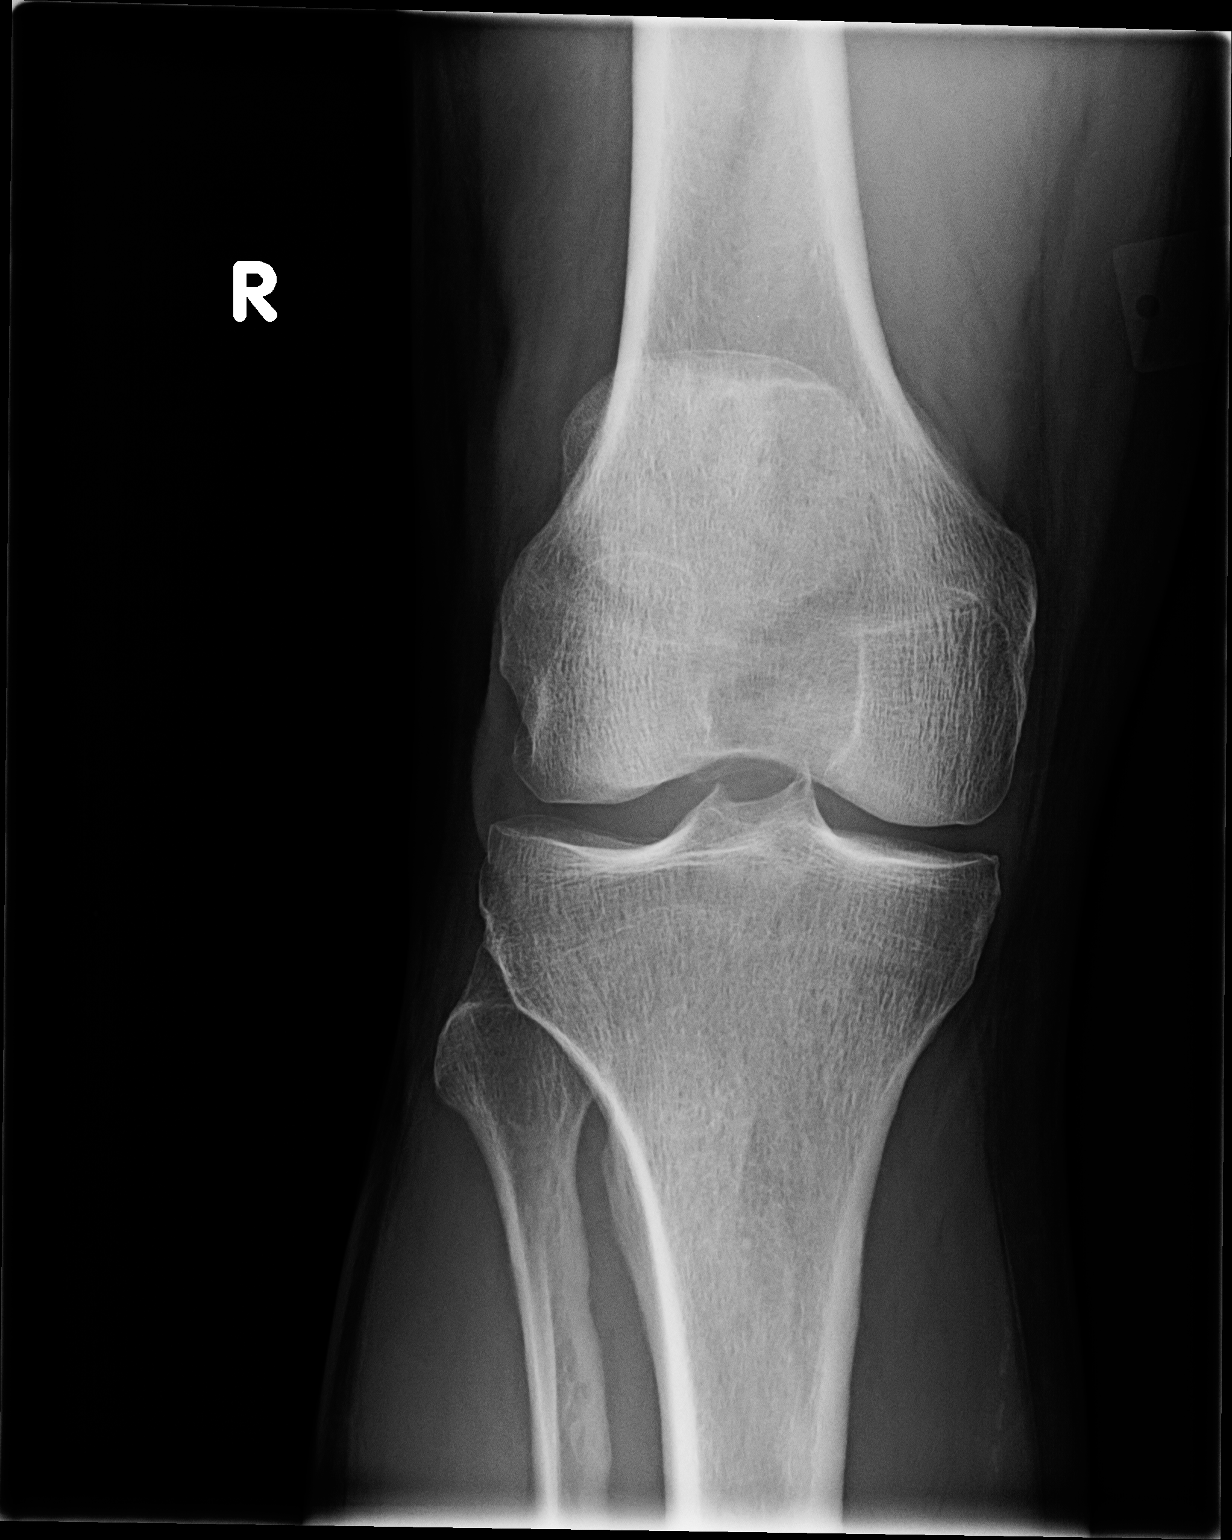

[view not recorded (2 of 2)]
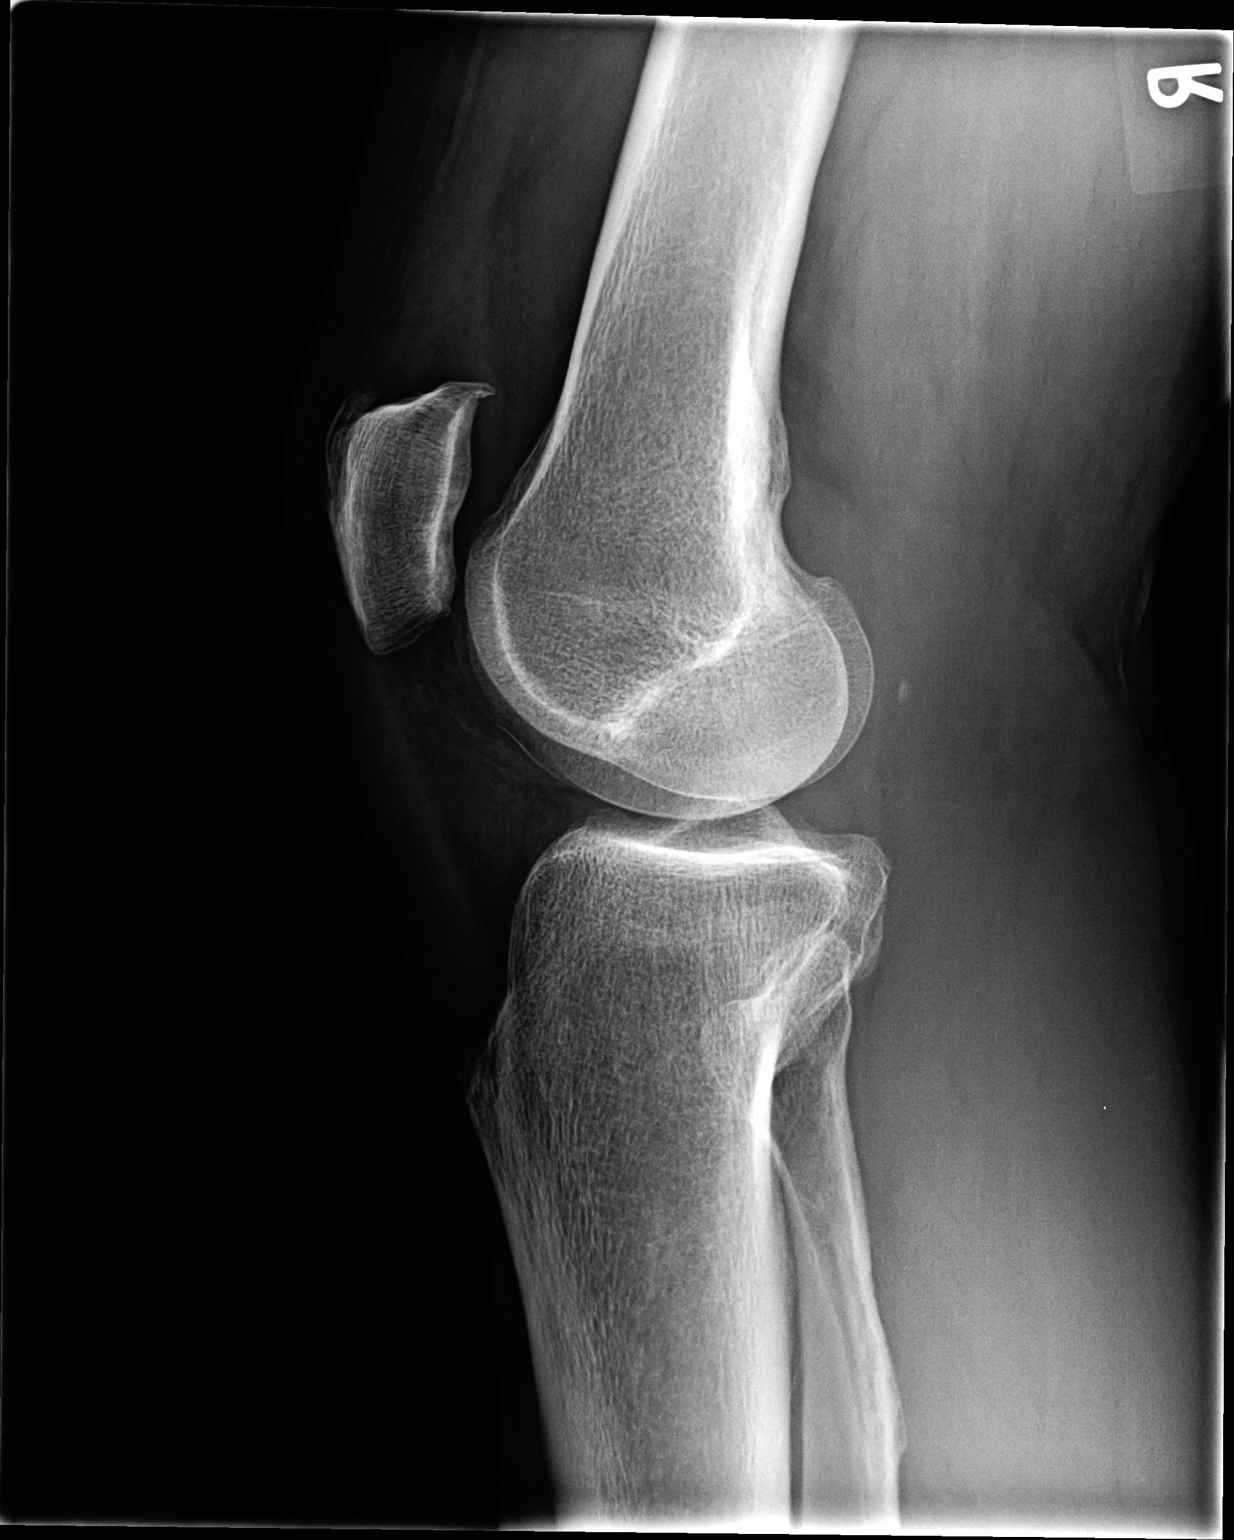

[2 of 2 positions shown; findings below may reference images not displayed]

FINDINGS: There is no joint effusion.  Moderate tricompartment
osteoarthritis is noted.  There is sharpening of the tibial spines
and marginal spur formation.  No radiopaque foreign bodies
identified.
IMPRESSION: 1.  Moderate tricompartment osteoarthritis.
2.  No acute findings.

Clinically significant discrepancy from primary report, if
provided: None

## 2014-03-01 ENCOUNTER — Other Ambulatory Visit: Payer: Self-pay | Admitting: Nurse Practitioner

## 2014-03-13 ENCOUNTER — Other Ambulatory Visit: Payer: Self-pay | Admitting: Nurse Practitioner

## 2014-03-20 ENCOUNTER — Other Ambulatory Visit: Payer: Self-pay | Admitting: Nurse Practitioner

## 2014-03-23 ENCOUNTER — Other Ambulatory Visit: Payer: Self-pay | Admitting: Nurse Practitioner

## 2014-04-26 ENCOUNTER — Other Ambulatory Visit: Payer: Self-pay | Admitting: Nurse Practitioner

## 2014-05-07 ENCOUNTER — Other Ambulatory Visit: Payer: Self-pay | Admitting: Nurse Practitioner

## 2014-05-11 ENCOUNTER — Encounter: Payer: Self-pay | Admitting: Internal Medicine

## 2014-05-12 ENCOUNTER — Ambulatory Visit (INDEPENDENT_AMBULATORY_CARE_PROVIDER_SITE_OTHER): Payer: BC Managed Care – PPO | Admitting: Nurse Practitioner

## 2014-05-12 ENCOUNTER — Encounter: Payer: Self-pay | Admitting: Nurse Practitioner

## 2014-05-12 VITALS — BP 138/82 | HR 62 | Temp 97.0°F | Ht 65.0 in | Wt 177.0 lb

## 2014-05-12 DIAGNOSIS — E119 Type 2 diabetes mellitus without complications: Secondary | ICD-10-CM | POA: Diagnosis not present

## 2014-05-12 DIAGNOSIS — I10 Essential (primary) hypertension: Secondary | ICD-10-CM

## 2014-05-12 DIAGNOSIS — E785 Hyperlipidemia, unspecified: Secondary | ICD-10-CM | POA: Diagnosis not present

## 2014-05-12 LAB — POCT UA - MICROALBUMIN: Microalbumin Ur, POC: 20 mg/L

## 2014-05-12 LAB — POCT GLYCOSYLATED HEMOGLOBIN (HGB A1C): HEMOGLOBIN A1C: 6.6

## 2014-05-12 MED ORDER — METFORMIN HCL 1000 MG PO TABS: ORAL_TABLET | ORAL | Status: DC

## 2014-05-12 MED ORDER — AMLODIPINE BESY-BENAZEPRIL HCL 5-20 MG PO CAPS: 1.0000 | ORAL_CAPSULE | Freq: Every day | ORAL | Status: DC

## 2014-05-12 MED ORDER — EZETIMIBE 10 MG PO TABS: 10.0000 mg | ORAL_TABLET | Freq: Every day | ORAL | Status: DC

## 2014-05-12 MED ORDER — GLIMEPIRIDE 4 MG PO TABS: 4.0000 mg | ORAL_TABLET | Freq: Every day | ORAL | Status: DC

## 2014-05-12 NOTE — Progress Notes (Signed)
Subjective:    Patient ID: Rodney Glover, male    DOB: April 16, 1950, 64 y.o.   MRN: [id number]  Patient presents today for follow-up of chronic medical problems - Having several episodes of hypoglycemia with blood sugars running in the am of 90-100.  Has had an episode of feeling faint, weak and checked blood sugar with it being 57 [place].  Ate some cookies with blood sugar returning to normal at that time. Diabetes He presents for his follow-up diabetic visit. He has type 2 diabetes mellitus. His disease course has been stable. Symptoms are stable. Risk factors for coronary artery disease include dyslipidemia, diabetes mellitus, hypertension and male sex. Current diabetic treatment includes oral agent (dual therapy). He is compliant with treatment most of the time. His weight is stable. He is following a diabetic diet. He has not had a previous visit with a dietitian. He participates in exercise daily. There is no change in his home blood glucose trend. His breakfast blood glucose is taken between 8-9 am. His breakfast blood glucose range is generally 110-130 mg/dl. His overall blood glucose range is 110-130 mg/dl. An ACE inhibitor/angiotensin II receptor blocker is being taken. Eye exam is not current.  Hypertension This is a chronic problem. The current episode started more than 1 year ago. The problem is controlled. Risk factors for coronary artery disease include diabetes mellitus, dyslipidemia and male gender. Past treatments include calcium channel blockers and ACE inhibitors. The current treatment provides moderate improvement. There are no compliance problems.   Hyperlipidemia This is a chronic problem. The current episode started more than 1 year ago. The problem is controlled. Recent lipid tests were reviewed and are normal. There are no known factors aggravating his hyperlipidemia. Current antihyperlipidemic treatment includes ezetimibe and statins. The current treatment  provides significant improvement of lipids. There are no compliance problems.  Risk factors for coronary artery disease include diabetes mellitus, dyslipidemia, hypertension and male sex.     Review of Systems  Constitutional: Negative.   HENT: Negative.   Respiratory: Negative.   Cardiovascular: Negative.   Genitourinary: Negative.   Skin: Negative.   Psychiatric/Behavioral: Negative.   All other systems reviewed and are negative.      Objective:   Physical Exam  Constitutional: He is oriented to person, place, and time. He appears well-developed and well-nourished.  HENT:  Head: Normocephalic.  Right Ear: External ear normal.  Left Ear: External ear normal.  Nose: Nose normal.  Mouth/Throat: Oropharynx is clear and moist.  Eyes: EOM are normal. Pupils are equal, round, and reactive to light.  Neck: Normal range of motion. Neck supple. No JVD present. No thyromegaly present.  Cardiovascular: Normal rate, regular rhythm, normal heart sounds and intact distal pulses.  Exam reveals no gallop and no friction rub.   No murmur heard. Pulmonary/Chest: Effort normal and breath sounds normal. No respiratory distress. He has no wheezes. He has no rales. He exhibits no tenderness.  Abdominal: Soft. Bowel sounds are normal. He exhibits no mass. There is no tenderness.  Musculoskeletal: Normal range of motion. He exhibits no edema.  Lymphadenopathy:    He has no cervical adenopathy.  Neurological: He is alert and oriented to person, place, and time. No cranial nerve deficit.  Skin: Skin is warm and dry.  Psychiatric: He has a normal mood and affect. His behavior is normal. Judgment and thought content normal.      BP 138/82 mmHg  Pulse 62  Temp(Src) 97 F (36.1  C) (Oral)  Ht '5\' 5"'  (1.651 m)  Wt 177 lb (80.287 kg)  BMI 29.45 kg/m2  Results for orders placed or performed in visit on 05/12/14  POCT glycosylated hemoglobin (Hb A1C)  Result Value Ref Range   Hemoglobin A1C 6.6%    POCT UA - Microalbumin  Result Value Ref Range   Microalbumin Ur, POC 20 mg/L       ASSESSMENT/PLAN: 1. Type 2 diabetes mellitus without complication Continue to watch carbs - POCT glycosylated hemoglobin (Hb A1C) - POCT UA - Microalbumin - Microalbumin, urine - glimepiride (AMARYL) 4 MG tablet; Take 1 tablet (4 mg total) by mouth daily before breakfast.  Dispense: 90 tablet; Refill: 1 - metFORMIN (GLUCOPHAGE) 1000 MG tablet; TAKE 1 TABLET TWICE A DAY WITH A MEAL  Dispense: 180 tablet; Refill: 1  2. Hyperlipidemia Low fta diet - NMR, lipoprofile - ezetimibe (ZETIA) 10 MG tablet; Take 1 tablet (10 mg total) by mouth daily.  Dispense: 90 tablet; Refill: 1  3. Essential hypertension Do not add salt to diet - CMP14+EGFR - amLODipine-benazepril (LOTREL) 5-20 MG per capsule; Take 1 capsule by mouth daily.  Dispense: 90 capsule; Refill: 1    Labs pending Health maintenance reviewed Diet and exercise encouraged Continue all meds Follow up  In 3 month   [place], FNP

## 2014-05-12 NOTE — Patient Instructions (Signed)
Exercise to Stay Healthy Exercise helps you become and stay healthy. EXERCISE IDEAS AND TIPS Choose exercises that:  You enjoy.  Fit into your day. You do not need to exercise really hard to be healthy. You can do exercises at a slow or medium level and stay healthy. You can:  Stretch before and after working out.  Try yoga, Pilates, or tai chi.  Lift weights.  Walk fast, swim, jog, run, climb stairs, bicycle, dance, or rollerskate.  Take aerobic classes. Exercises that burn about 150 calories:  Running 1  miles in 15 minutes.  Playing volleyball for 45 to 60 minutes.  Washing and waxing a car for 45 to 60 minutes.  Playing touch football for 45 minutes.  Walking 1  miles in 35 minutes.  Pushing a stroller 1  miles in 30 minutes.  Playing basketball for 30 minutes.  Raking leaves for 30 minutes.  Bicycling 5 miles in 30 minutes.  Walking 2 miles in 30 minutes.  Dancing for 30 minutes.  Shoveling snow for 15 minutes.  Swimming laps for 20 minutes.  Walking up stairs for 15 minutes.  Bicycling 4 miles in 15 minutes.  Gardening for 30 to 45 minutes.  Jumping rope for 15 minutes.  Washing windows or floors for 45 to 60 minutes. Document Released: 02/23/2010 Document Revised: 04/15/2011 Document Reviewed: 02/23/2010 [employer] Patient Information 2015 [employer]. This information is not intended to replace advice given to you by your health care provider. Make sure you discuss any questions you have with your health care provider.  

## 2014-05-13 ENCOUNTER — Other Ambulatory Visit: Payer: BC Managed Care – PPO

## 2014-05-13 DIAGNOSIS — Z1212 Encounter for screening for malignant neoplasm of rectum: Secondary | ICD-10-CM

## 2014-05-13 LAB — CMP14+EGFR
A/G RATIO: 2 (ref 1.1–2.5)
ALT: 31 IU/L (ref 0–44)
AST: 36 IU/L (ref 0–40)
Albumin: 4.6 g/dL (ref 3.6–4.8)
Alkaline Phosphatase: 55 IU/L (ref 39–117)
BUN/Creatinine Ratio: 12 (ref 10–22)
BUN: 10 mg/dL (ref 8–27)
Bilirubin Total: 0.5 mg/dL (ref 0.0–1.2)
CALCIUM: 9.6 mg/dL (ref 8.6–10.2)
CO2: 23 mmol/L (ref 18–29)
Chloride: 102 mmol/L (ref 97–108)
Creatinine, Ser: 0.82 mg/dL (ref 0.76–1.27)
GFR calc Af Amer: 109 mL/min/{1.73_m2} (ref >59)
GFR, EST NON AFRICAN AMERICAN: 94 mL/min/{1.73_m2} (ref >59)
Globulin, Total: 2.3 g/dL (ref 1.5–4.5)
Glucose: 112 mg/dL — ABNORMAL HIGH (ref 65–99)
POTASSIUM: 4.9 mmol/L (ref 3.5–5.2)
Sodium: 143 mmol/L (ref 134–144)
Total Protein: 6.9 g/dL (ref 6.0–8.5)

## 2014-05-13 LAB — MICROALBUMIN, URINE: Microalbumin, Urine: 5.4 ug/mL (ref 0.0–17.0)

## 2014-05-13 LAB — NMR, LIPOPROFILE
Cholesterol: 120 mg/dL (ref 100–199)
HDL CHOLESTEROL BY NMR: 48 mg/dL (ref >39)
HDL Particle Number: 38.8 umol/L (ref >=30.5)
LDL Particle Number: 699 nmol/L (ref <1000)
LDL Size: 20.6 nm (ref >20.5)
LDL-C: 59 mg/dL (ref 0–99)
LP-IR Score: 37 (ref <=45)
Small LDL Particle Number: 296 nmol/L (ref <=527)
Triglycerides by NMR: 67 mg/dL (ref 0–149)

## 2014-05-15 LAB — FECAL OCCULT BLOOD, IMMUNOCHEMICAL: Fecal Occult Bld: NEGATIVE

## 2014-07-03 ENCOUNTER — Other Ambulatory Visit: Payer: Self-pay | Admitting: Nurse Practitioner

## 2014-09-08 ENCOUNTER — Ambulatory Visit (INDEPENDENT_AMBULATORY_CARE_PROVIDER_SITE_OTHER): Payer: BC Managed Care – PPO | Admitting: Nurse Practitioner

## 2014-09-08 ENCOUNTER — Encounter: Payer: Self-pay | Admitting: Nurse Practitioner

## 2014-09-08 VITALS — BP 134/82 | HR 59 | Temp 97.1°F | Ht 65.0 in | Wt 169.6 lb

## 2014-09-08 DIAGNOSIS — E119 Type 2 diabetes mellitus without complications: Secondary | ICD-10-CM | POA: Diagnosis not present

## 2014-09-08 DIAGNOSIS — I10 Essential (primary) hypertension: Secondary | ICD-10-CM

## 2014-09-08 DIAGNOSIS — Z860101 Personal history of adenomatous and serrated colon polyps: Secondary | ICD-10-CM

## 2014-09-08 DIAGNOSIS — E785 Hyperlipidemia, unspecified: Secondary | ICD-10-CM | POA: Diagnosis not present

## 2014-09-08 DIAGNOSIS — Z86010 Personal history of colonic polyps: Secondary | ICD-10-CM

## 2014-09-08 LAB — POCT GLYCOSYLATED HEMOGLOBIN (HGB A1C): Hemoglobin A1C: 6.2

## 2014-09-08 MED ORDER — METFORMIN HCL 1000 MG PO TABS: ORAL_TABLET | ORAL | Status: DC

## 2014-09-08 MED ORDER — EZETIMIBE 10 MG PO TABS: 10.0000 mg | ORAL_TABLET | Freq: Every day | ORAL | Status: DC

## 2014-09-08 MED ORDER — AMLODIPINE BESY-BENAZEPRIL HCL 5-20 MG PO CAPS: 1.0000 | ORAL_CAPSULE | Freq: Every day | ORAL | Status: DC

## 2014-09-08 MED ORDER — ROSUVASTATIN CALCIUM 20 MG PO TABS: ORAL_TABLET | ORAL | Status: DC

## 2014-09-08 MED ORDER — GLIMEPIRIDE 4 MG PO TABS: 4.0000 mg | ORAL_TABLET | Freq: Every day | ORAL | Status: DC

## 2014-09-08 NOTE — Patient Instructions (Signed)
Exercise to Stay Healthy Exercise helps you become and stay healthy. EXERCISE IDEAS AND TIPS Choose exercises that:  You enjoy.  Fit into your day. You do not need to exercise really hard to be healthy. You can do exercises at a slow or medium level and stay healthy. You can:  Stretch before and after working out.  Try yoga, Pilates, or tai chi.  Lift weights.  Walk fast, swim, jog, run, climb stairs, bicycle, dance, or rollerskate.  Take aerobic classes. Exercises that burn about 150 calories:  Running 1  miles in 15 minutes.  Playing volleyball for 45 to 60 minutes.  Washing and waxing a car for 45 to 60 minutes.  Playing touch football for 45 minutes.  Walking 1  miles in 35 minutes.  Pushing a stroller 1  miles in 30 minutes.  Playing basketball for 30 minutes.  Raking leaves for 30 minutes.  Bicycling 5 miles in 30 minutes.  Walking 2 miles in 30 minutes.  Dancing for 30 minutes.  Shoveling snow for 15 minutes.  Swimming laps for 20 minutes.  Walking up stairs for 15 minutes.  Bicycling 4 miles in 15 minutes.  Gardening for 30 to 45 minutes.  Jumping rope for 15 minutes.  Washing windows or floors for 45 to 60 minutes. Document Released: 02/23/2010 Document Revised: 04/15/2011 Document Reviewed: 02/23/2010 [employer] Patient Information 2015 [employer]. This information is not intended to replace advice given to you by your health care provider. Make sure you discuss any questions you have with your health care provider.  

## 2014-09-08 NOTE — Progress Notes (Signed)
Subjective:    Patient ID: Rodney Glover, male    DOB: [birth year], 64 y.o.   MRN: [id number]  Patient presents today for follow-up of chronic medical problems    Diabetes He presents for his follow-up diabetic visit. He has type 2 diabetes mellitus. His disease course has been stable. Symptoms are stable. Risk factors for coronary artery disease include dyslipidemia, diabetes mellitus, hypertension and male sex. Current diabetic treatment includes oral agent (dual therapy). He is compliant with treatment most of the time. His weight is stable. He is following a diabetic diet. He has not had a previous visit with a dietitian. He participates in exercise daily. There is no change in his home blood glucose trend. His breakfast blood glucose is taken between 8-9 am. His breakfast blood glucose range is generally 110-130 mg/dl. His overall blood glucose range is 110-130 mg/dl. An ACE inhibitor/angiotensin II receptor blocker is being taken. Eye exam is not current.  Hypertension This is a chronic problem. The current episode started more than 1 year ago. The problem is controlled. Risk factors for coronary artery disease include diabetes mellitus, dyslipidemia and male gender. Past treatments include calcium channel blockers and ACE inhibitors. The current treatment provides moderate improvement. There are no compliance problems.   Hyperlipidemia This is a chronic problem. The current episode started more than 1 year ago. The problem is controlled. Recent lipid tests were reviewed and are normal. There are no known factors aggravating his hyperlipidemia. Current antihyperlipidemic treatment includes ezetimibe and statins. The current treatment provides significant improvement of lipids. There are no compliance problems.  Risk factors for coronary artery disease include diabetes mellitus, dyslipidemia, hypertension and male sex.     Review of Systems  Constitutional: Negative.   HENT: Negative.    Respiratory: Negative.   Cardiovascular: Negative.   Genitourinary: Negative.   Skin: Negative.   Psychiatric/Behavioral: Negative.   All other systems reviewed and are negative.      Objective:   Physical Exam  Constitutional: He is oriented to person, place, and time. He appears well-developed and well-nourished.  HENT:  Head: Normocephalic.  Right Ear: External ear normal.  Left Ear: External ear normal.  Nose: Nose normal.  Mouth/Throat: Oropharynx is clear and moist.  Eyes: EOM are normal. Pupils are equal, round, and reactive to light.  Neck: Normal range of motion. Neck supple. No JVD present. No thyromegaly present.  Cardiovascular: Normal rate, regular rhythm, normal heart sounds and intact distal pulses.  Exam reveals no gallop and no friction rub.   No murmur heard. Pulmonary/Chest: Effort normal and breath sounds normal. No respiratory distress. He has no wheezes. He has no rales. He exhibits no tenderness.  Abdominal: Soft. Bowel sounds are normal. He exhibits no mass. There is no tenderness.  Musculoskeletal: Normal range of motion. He exhibits no edema.  Lymphadenopathy:    He has no cervical adenopathy.  Neurological: He is alert and oriented to person, place, and time. No cranial nerve deficit.  Skin: Skin is warm and dry.  Psychiatric: He has a normal mood and affect. His behavior is normal. Judgment and thought content normal.     BP 134/82 mmHg  Pulse 59  Temp(Src) 97.1 F (36.2 C) (Oral)  Ht '5\' 5"'  (1.651 m)  Wt 169 lb 9.6 oz (76.93 kg)  BMI 28.22 kg/m2   Results for orders placed or performed in visit on 09/08/14  POCT glycosylated hemoglobin (Hb A1C)  Result Value Ref Range   Hemoglobin A1C 6.2  ASSESSMENT AND PLAN  1. Type 2 diabetes mellitus without complication Continue to carb count - POCT glycosylated hemoglobin (Hb A1C) - metFORMIN (GLUCOPHAGE) 1000 MG tablet; TAKE 1 TABLET TWICE A DAY WITH A MEAL  Dispense: 180 tablet; Refill:  1 - glimepiride (AMARYL) 4 MG tablet; Take 1 tablet (4 mg total) by mouth daily before breakfast.  Dispense: 90 tablet; Refill: 1  2. Essential hypertension Do not add salt  To diet - CMP14+EGFR - amLODipine-benazepril (LOTREL) 5-20 MG per capsule; Take 1 capsule by mouth daily.  Dispense: 90 capsule; Refill: 1  3. Hyperlipidemia Low fat diet - Lipid panel - rosuvastatin (CRESTOR) 20 MG tablet; TAKE 1 TABLET (20 MG TOTAL) BY MOUTH DAILY.  Dispense: 90 tablet; Refill: 1 - ezetimibe (ZETIA) 10 MG tablet; Take 1 tablet (10 mg total) by mouth daily.  Dispense: 90 tablet; Refill: 1  4. Hx of adenomatous colonic polyps     Labs pending Health maintenance reviewed Diet and exercise encouraged Continue all meds Follow up  In 3 month   [place], FNP

## 2014-09-09 LAB — CMP14+EGFR
ALBUMIN: 4.5 g/dL (ref 3.6–4.8)
ALK PHOS: 60 IU/L (ref 39–117)
ALT: 35 IU/L (ref 0–44)
AST: 36 IU/L (ref 0–40)
Albumin/Globulin Ratio: 2 (ref 1.1–2.5)
BUN/Creatinine Ratio: 11 (ref 10–22)
BUN: 9 mg/dL (ref 8–27)
Bilirubin Total: 0.4 mg/dL (ref 0.0–1.2)
CO2: 23 mmol/L (ref 18–29)
CREATININE: 0.8 mg/dL (ref 0.76–1.27)
Calcium: 9.4 mg/dL (ref 8.6–10.2)
Chloride: 102 mmol/L (ref 97–108)
GFR calc Af Amer: 110 mL/min/{1.73_m2} (ref >59)
GFR, EST NON AFRICAN AMERICAN: 95 mL/min/{1.73_m2} (ref >59)
Globulin, Total: 2.3 g/dL (ref 1.5–4.5)
Glucose: 83 mg/dL (ref 65–99)
Potassium: 4.5 mmol/L (ref 3.5–5.2)
Sodium: 142 mmol/L (ref 134–144)
Total Protein: 6.8 g/dL (ref 6.0–8.5)

## 2014-09-09 LAB — LIPID PANEL
Chol/HDL Ratio: 2.8 ratio units (ref 0.0–5.0)
Cholesterol, Total: 137 mg/dL (ref 100–199)
HDL: 49 mg/dL (ref >39)
LDL Calculated: 63 mg/dL (ref 0–99)
Triglycerides: 126 mg/dL (ref 0–149)
VLDL Cholesterol Cal: 25 mg/dL (ref 5–40)

## 2014-09-21 ENCOUNTER — Encounter: Payer: Self-pay | Admitting: *Deleted

## 2014-10-26 ENCOUNTER — Encounter: Payer: Self-pay | Admitting: Internal Medicine

## 2015-01-05 ENCOUNTER — Ambulatory Visit (INDEPENDENT_AMBULATORY_CARE_PROVIDER_SITE_OTHER): Payer: BC Managed Care – PPO | Admitting: Nurse Practitioner

## 2015-01-05 ENCOUNTER — Encounter: Payer: Self-pay | Admitting: Nurse Practitioner

## 2015-01-05 VITALS — BP 131/78 | HR 62 | Temp 97.3°F | Ht 65.0 in | Wt 173.0 lb

## 2015-01-05 DIAGNOSIS — I10 Essential (primary) hypertension: Secondary | ICD-10-CM

## 2015-01-05 DIAGNOSIS — Z1159 Encounter for screening for other viral diseases: Secondary | ICD-10-CM | POA: Diagnosis not present

## 2015-01-05 DIAGNOSIS — Z6828 Body mass index (BMI) 28.0-28.9, adult: Secondary | ICD-10-CM | POA: Diagnosis not present

## 2015-01-05 DIAGNOSIS — E119 Type 2 diabetes mellitus without complications: Secondary | ICD-10-CM | POA: Diagnosis not present

## 2015-01-05 DIAGNOSIS — E785 Hyperlipidemia, unspecified: Secondary | ICD-10-CM | POA: Diagnosis not present

## 2015-01-05 LAB — POCT GLYCOSYLATED HEMOGLOBIN (HGB A1C): HEMOGLOBIN A1C: 6.2

## 2015-01-05 NOTE — Patient Instructions (Signed)

## 2015-01-05 NOTE — Progress Notes (Signed)
Subjective:    Patient ID: Rodney Glover, male    DOB: March 27, 1950, 64 y.o.   MRN: [id number]  Patient presents today for follow-up of chronic medical problems. No complaints today.   Diabetes He presents for his follow-up diabetic visit. He has type 2 diabetes mellitus. His disease course has been stable. Symptoms are stable. Risk factors for coronary artery disease include dyslipidemia, diabetes mellitus, hypertension and male sex. Current diabetic treatment includes oral agent (dual therapy). He is compliant with treatment most of the time. His weight is stable. He is following a diabetic diet. He has not had a previous visit with a dietitian. He participates in exercise daily. There is no change in his home blood glucose trend. His breakfast blood glucose is taken between 8-9 am. His breakfast blood glucose range is generally 110-130 mg/dl. His overall blood glucose range is 110-130 mg/dl. An ACE inhibitor/angiotensin II receptor blocker is being taken. Eye exam is not current.  Hypertension This is a chronic problem. The current episode started more than 1 year ago. The problem is controlled. Risk factors for coronary artery disease include diabetes mellitus, dyslipidemia and male gender. Past treatments include calcium channel blockers and ACE inhibitors. The current treatment provides moderate improvement. There are no compliance problems.   Hyperlipidemia This is a chronic problem. The current episode started more than 1 year ago. The problem is controlled. Recent lipid tests were reviewed and are normal. There are no known factors aggravating his hyperlipidemia. Current antihyperlipidemic treatment includes ezetimibe and statins. The current treatment provides significant improvement of lipids. There are no compliance problems.  Risk factors for coronary artery disease include diabetes mellitus, dyslipidemia, hypertension and male sex.     Review of Systems  Constitutional: Negative.    HENT: Negative.   Respiratory: Negative.   Cardiovascular: Negative.   Genitourinary: Negative.   Skin: Negative.   Psychiatric/Behavioral: Negative.   All other systems reviewed and are negative.      Objective:   Physical Exam  Constitutional: He is oriented to person, place, and time. He appears well-developed and well-nourished.  HENT:  Head: Normocephalic.  Right Ear: External ear normal.  Left Ear: External ear normal.  Nose: Nose normal.  Mouth/Throat: Oropharynx is clear and moist.  Eyes: EOM are normal. Pupils are equal, round, and reactive to light.  Neck: Normal range of motion. Neck supple. No JVD present. No thyromegaly present.  Cardiovascular: Normal rate, regular rhythm, normal heart sounds and intact distal pulses.  Exam reveals no gallop and no friction rub.   No murmur heard. Pulmonary/Chest: Effort normal and breath sounds normal. No respiratory distress. He has no wheezes. He has no rales. He exhibits no tenderness.  Abdominal: Soft. Bowel sounds are normal. He exhibits no mass. There is no tenderness.  Musculoskeletal: Normal range of motion. He exhibits no edema.  Lymphadenopathy:    He has no cervical adenopathy.  Neurological: He is alert and oriented to person, place, and time. No cranial nerve deficit.  Skin: Skin is warm and dry.  Psychiatric: He has a normal mood and affect. His behavior is normal. Judgment and thought content normal.     BP 131/78 mmHg  Pulse 62  Temp(Src) 97.3 F (36.3 C) (Oral)  Ht _0  (1.651 m)  Wt 173 lb (78.472 kg)  BMI 28.79 kg/m2        ASSESSMENT AND PLAN  1. Type 2 diabetes mellitus without complication, without long-term current use of insulin ([place]) Continue to watch  carbsin diet - POCT glycosylated hemoglobin (Hb A1C)  2. Essential hypertension Do not add salt to diet - CMP14+EGFR  3. Hyperlipidemia Low fat diet - Lipid panel  4. BMI 28.0-28.9,adult Discussed diet and exercise for person with  BMI >25 Will recheck weight in 3-6 months  5. Need for hepatitis C screening test - Hepatitis C antibody    Labs pending Health maintenance reviewed Diet and exercise encouraged Continue all meds Follow up  In 3 months   [place], FNP

## 2015-01-06 LAB — CMP14+EGFR
ALT: 36 IU/L (ref 0–44)
AST: 34 IU/L (ref 0–40)
Albumin/Globulin Ratio: 1.9 (ref 1.1–2.5)
Albumin: 4.4 g/dL (ref 3.6–4.8)
Alkaline Phosphatase: 55 IU/L (ref 39–117)
BUN/Creatinine Ratio: 15 (ref 10–22)
BUN: 12 mg/dL (ref 8–27)
Bilirubin Total: 0.6 mg/dL (ref 0.0–1.2)
CO2: 24 mmol/L (ref 18–29)
CREATININE: 0.81 mg/dL (ref 0.76–1.27)
Calcium: 9.2 mg/dL (ref 8.6–10.2)
Chloride: 100 mmol/L (ref 97–106)
GFR calc Af Amer: 109 mL/min/{1.73_m2} (ref >59)
GFR, EST NON AFRICAN AMERICAN: 95 mL/min/{1.73_m2} (ref >59)
GLUCOSE: 101 mg/dL — AB (ref 65–99)
Globulin, Total: 2.3 g/dL (ref 1.5–4.5)
Potassium: 4.9 mmol/L (ref 3.5–5.2)
Sodium: 140 mmol/L (ref 136–144)
TOTAL PROTEIN: 6.7 g/dL (ref 6.0–8.5)

## 2015-01-06 LAB — LIPID PANEL
Chol/HDL Ratio: 2.7 ratio units (ref 0.0–5.0)
Cholesterol, Total: 124 mg/dL (ref 100–199)
HDL: 46 mg/dL (ref >39)
LDL CALC: 63 mg/dL (ref 0–99)
TRIGLYCERIDES: 73 mg/dL (ref 0–149)
VLDL CHOLESTEROL CAL: 15 mg/dL (ref 5–40)

## 2015-01-06 LAB — HEPATITIS C ANTIBODY: Hep C Virus Ab: <0.1 s/co ratio (ref 0.0–0.9)

## 2015-03-07 LAB — HM DIABETES EYE EXAM

## 2015-03-09 ENCOUNTER — Encounter: Payer: Self-pay | Admitting: *Deleted

## 2015-03-11 ENCOUNTER — Other Ambulatory Visit: Payer: Self-pay | Admitting: Nurse Practitioner

## 2015-04-05 ENCOUNTER — Encounter: Payer: Self-pay | Admitting: Nurse Practitioner

## 2015-04-06 MED ORDER — ROSUVASTATIN CALCIUM 20 MG PO TABS: 20.0000 mg | ORAL_TABLET | Freq: Every day | ORAL | Status: DC

## 2015-04-06 NOTE — Telephone Encounter (Signed)
Refill was done 03/11/15 but sent to [facility] mail order pharmacy. Reordered refill to [facility] per Pt's email.

## 2015-04-07 ENCOUNTER — Other Ambulatory Visit: Payer: Self-pay | Admitting: Nurse Practitioner

## 2015-04-07 MED ORDER — ROSUVASTATIN CALCIUM 20 MG PO TABS: 20.0000 mg | ORAL_TABLET | Freq: Every day | ORAL | Status: DC

## 2015-04-07 NOTE — Telephone Encounter (Signed)
done

## 2015-04-14 ENCOUNTER — Other Ambulatory Visit: Payer: Self-pay | Admitting: Nurse Practitioner

## 2015-04-14 DIAGNOSIS — I10 Essential (primary) hypertension: Secondary | ICD-10-CM

## 2015-04-14 MED ORDER — ROSUVASTATIN CALCIUM 20 MG PO TABS: 20.0000 mg | ORAL_TABLET | Freq: Every day | ORAL | Status: DC

## 2015-04-14 MED ORDER — AMLODIPINE BESY-BENAZEPRIL HCL 5-20 MG PO CAPS: 1.0000 | ORAL_CAPSULE | Freq: Every day | ORAL | Status: DC

## 2015-04-18 ENCOUNTER — Other Ambulatory Visit: Payer: Self-pay | Admitting: Nurse Practitioner

## 2015-05-09 ENCOUNTER — Encounter: Payer: Self-pay | Admitting: Nurse Practitioner

## 2015-05-09 ENCOUNTER — Ambulatory Visit (INDEPENDENT_AMBULATORY_CARE_PROVIDER_SITE_OTHER): Payer: BC Managed Care – PPO | Admitting: Nurse Practitioner

## 2015-05-09 VITALS — BP 144/88 | HR 62 | Temp 97.9°F | Ht 65.0 in | Wt 170.0 lb

## 2015-05-09 DIAGNOSIS — E785 Hyperlipidemia, unspecified: Secondary | ICD-10-CM

## 2015-05-09 DIAGNOSIS — I10 Essential (primary) hypertension: Secondary | ICD-10-CM | POA: Diagnosis not present

## 2015-05-09 DIAGNOSIS — Z6828 Body mass index (BMI) 28.0-28.9, adult: Secondary | ICD-10-CM | POA: Diagnosis not present

## 2015-05-09 DIAGNOSIS — E119 Type 2 diabetes mellitus without complications: Secondary | ICD-10-CM | POA: Diagnosis not present

## 2015-05-09 LAB — BAYER DCA HB A1C WAIVED: HB A1C (BAYER DCA - WAIVED): 6.3 % (ref <7.0)

## 2015-05-09 MED ORDER — AMLODIPINE BESY-BENAZEPRIL HCL 5-20 MG PO CAPS: 1.0000 | ORAL_CAPSULE | Freq: Every day | ORAL | Status: DC

## 2015-05-09 MED ORDER — EZETIMIBE 10 MG PO TABS: 10.0000 mg | ORAL_TABLET | Freq: Every day | ORAL | Status: DC

## 2015-05-09 MED ORDER — GLIMEPIRIDE 4 MG PO TABS: 4.0000 mg | ORAL_TABLET | Freq: Every day | ORAL | Status: DC

## 2015-05-09 MED ORDER — METFORMIN HCL 1000 MG PO TABS: ORAL_TABLET | ORAL | Status: DC

## 2015-05-09 NOTE — Patient Instructions (Signed)
Diabetes and Foot Care Diabetes may cause you to have problems because of poor blood supply (circulation) to your feet and legs. This may cause the skin on your feet to become thinner, break easier, and heal more slowly. Your skin may become dry, and the skin may peel and crack. You may also have nerve damage in your legs and feet causing decreased feeling in them. You may not notice minor injuries to your feet that could lead to infections or more serious problems. Taking care of your feet is one of the most important things you can do for yourself.  HOME CARE INSTRUCTIONS  Wear shoes at all times, even in the house. Do not go barefoot. Bare feet are easily injured.  Check your feet daily for blisters, cuts, and redness. If you cannot see the bottom of your feet, use a mirror or ask someone for help.  Wash your feet with warm water (do not use hot water) and mild soap. Then pat your feet and the areas between your toes until they are completely dry. Do not soak your feet as this can dry your skin.  Apply a moisturizing lotion or petroleum jelly (that does not contain alcohol and is unscented) to the skin on your feet and to dry, brittle toenails. Do not apply lotion between your toes.  Trim your toenails straight across. Do not dig under them or around the cuticle. File the edges of your nails with an emery board or nail file.  Do not cut corns or calluses or try to remove them with medicine.  Wear clean socks or stockings every day. Make sure they are not too tight. Do not wear knee-high stockings since they may decrease blood flow to your legs.  Wear shoes that fit properly and have enough cushioning. To break in new shoes, wear them for just a few hours a day. This prevents you from injuring your feet. Always look in your shoes before you put them on to be sure there are no objects inside.  Do not cross your legs. This may decrease the blood flow to your feet.  If you find a minor scrape,  cut, or break in the skin on your feet, keep it and the skin around it clean and dry. These areas may be cleansed with mild soap and water. Do not cleanse the area with peroxide, alcohol, or iodine.  When you remove an adhesive bandage, be sure not to damage the skin around it.  If you have a wound, look at it several times a day to make sure it is healing.  Do not use heating pads or hot water bottles. They may burn your skin. If you have lost feeling in your feet or legs, you may not know it is happening until it is too late.  Make sure your health care provider performs a complete foot exam at least annually or more often if you have foot problems. Report any cuts, sores, or bruises to your health care provider immediately. SEEK MEDICAL CARE IF:   You have an injury that is not healing.  You have cuts or breaks in the skin.  You have an ingrown nail.  You notice redness on your legs or feet.  You feel burning or tingling in your legs or feet.  You have pain or cramps in your legs and feet.  Your legs or feet are numb.  Your feet always feel cold. SEEK IMMEDIATE MEDICAL CARE IF:   There is increasing redness,   swelling, or pain in or around a wound.  There is a red line that goes up your leg.  Pus is coming from a wound.  You develop a fever or as directed by your health care provider.  You notice a bad smell coming from an ulcer or wound.   This information is not intended to replace advice given to you by your health care provider. Make sure you discuss any questions you have with your health care provider.   Document Released: 01/19/2000 Document Revised: 09/23/2012 Document Reviewed: 06/30/2012 [employer] Interactive Patient Education 2016 [employer]  

## 2015-05-09 NOTE — Progress Notes (Signed)
Subjective:    Patient ID: Rodney Glover, male    DOB: 16-Oct-1950, 65 y.o.   MRN: [id number]  Patient here today for follow up of chronic medical problems.  Outpatient Encounter Prescriptions as of 05/09/2015  Medication Sig  . amLODipine-benazepril (LOTREL) 5-20 MG capsule TAKE 1 CAPSULE DAILY  . aspirin 81 MG tablet Take 81 mg by mouth daily.    . Cholecalciferol (VITAMIN D-3) 5000 UNITS TABS Take 10,000 each by mouth daily.  . Cinnamon 500 MG TABS Take by mouth 2 (two) times daily.    Marland Kitchen ezetimibe (ZETIA) 10 MG tablet Take 1 tablet (10 mg total) by mouth daily.  . fish oil-omega-3 fatty acids 1000 MG capsule Take 2 g by mouth daily. 4 per day 1070m per day  . glimepiride (AMARYL) 4 MG tablet Take 1 tablet (4 mg total) by mouth daily before breakfast.  . glucose blood (ACCU-CHEK AVIVA PLUS) test strip Test 1x a day- dx 250.02  . Melatonin 1 MG TABS Take by mouth. At bedtime   . metFORMIN (GLUCOPHAGE) 1000 MG tablet TAKE 1 TABLET TWICE A DAY WITH A MEAL  . Multiple Vitamin (MULTIVITAMIN) capsule Take 1 capsule by mouth daily.    . rosuvastatin (CRESTOR) 20 MG tablet Take 1 tablet (20 mg total) by mouth daily.   No facility-administered encounter medications on file as of 05/09/2015.     Diabetes He presents for his follow-up diabetic visit. He has type 2 diabetes mellitus. His disease course has been stable. Symptoms are stable. Risk factors for coronary artery disease include dyslipidemia, diabetes mellitus, hypertension and male sex. Current diabetic treatment includes oral agent (dual therapy). He is compliant with treatment most of the time. His weight is stable. He is following a diabetic diet. He has not had a previous visit with a dietitian. He participates in exercise daily. There is no change in his home blood glucose trend. His breakfast blood glucose is taken between 8-9 am. His breakfast blood glucose range is generally 110-130 mg/dl. His overall blood glucose range is 110-130  mg/dl. An ACE inhibitor/angiotensin II receptor blocker is being taken. Eye exam is not current.  Hypertension This is a chronic problem. The current episode started more than 1 year ago. The problem is controlled. Risk factors for coronary artery disease include diabetes mellitus, dyslipidemia and male gender. Past treatments include calcium channel blockers and ACE inhibitors. The current treatment provides moderate improvement. There are no compliance problems.   Hyperlipidemia This is a chronic problem. The current episode started more than 1 year ago. The problem is controlled. Recent lipid tests were reviewed and are normal. There are no known factors aggravating his hyperlipidemia. Current antihyperlipidemic treatment includes ezetimibe and statins. The current treatment provides significant improvement of lipids. There are no compliance problems.  Risk factors for coronary artery disease include diabetes mellitus, dyslipidemia, hypertension and male sex.     Review of Systems  Constitutional: Negative.   HENT: Negative.   Respiratory: Negative.   Cardiovascular: Negative.   Genitourinary: Negative.   Skin: Negative.   Psychiatric/Behavioral: Negative.   All other systems reviewed and are negative.      Objective:   Physical Exam  Constitutional: He is oriented to person, place, and time. He appears well-developed and well-nourished.  HENT:  Head: Normocephalic.  Right Ear: External ear normal.  Left Ear: External ear normal.  Nose: Nose normal.  Mouth/Throat: Oropharynx is clear and moist.  Eyes: EOM are normal. Pupils are equal, round, and  reactive to light.  Neck: Normal range of motion. Neck supple. No JVD present. No thyromegaly present.  Cardiovascular: Normal rate, regular rhythm, normal heart sounds and intact distal pulses.  Exam reveals no gallop and no friction rub.   No murmur heard. Pulmonary/Chest: Effort normal and breath sounds normal. No respiratory distress.  He has no wheezes. He has no rales. He exhibits no tenderness.  Abdominal: Soft. Bowel sounds are normal. He exhibits no mass. There is no tenderness.  Musculoskeletal: Normal range of motion. He exhibits no edema.  Lymphadenopathy:    He has no cervical adenopathy.  Neurological: He is alert and oriented to person, place, and time. No cranial nerve deficit.  Skin: Skin is warm and dry.  Psychiatric: He has a normal mood and affect. His behavior is normal. Judgment and thought content normal.    BP 144/88 mmHg  Pulse 62  Temp(Src) 97.9 F (36.6 C) (Oral)  Ht '5\' 5"'  (1.651 m)  Wt 170 lb (77.111 kg)  BMI 28.29 kg/m2  HGBA1c- 6.3%   ASSESSMENT AND PLAN  1. Type 2 diabetes mellitus without complication, without long-term current use of insulin (HCC) Continue to watch carbs in diet - Bayer DCA Hb A1c Waived - glimepiride (AMARYL) 4 MG tablet; Take 1 tablet (4 mg total) by mouth daily before breakfast.  Dispense: 90 tablet; Refill: 1 - metFORMIN (GLUCOPHAGE) 1000 MG tablet; TAKE 1 TABLET TWICE A DAY WITH A MEAL  Dispense: 180 tablet; Refill: 1  2. Essential hypertension Do not add salt to diet - CMP14+EGFR  3. Hyperlipidemia Low fat diet - Lipid panel - ezetimibe (ZETIA) 10 MG tablet; Take 1 tablet (10 mg total) by mouth daily.  Dispense: 90 tablet; Refill: 1  4. BMI 28.0-28.9,adult Discussed diet and exercise for person with BMI >25 Will recheck weight in 3-6 months     Labs pending Health maintenance reviewed Diet and exercise encouraged Continue all meds Follow up  In 3 month   [place], FNP

## 2015-05-10 LAB — CMP14+EGFR
ALBUMIN: 4.6 g/dL (ref 3.6–4.8)
ALT: 33 IU/L (ref 0–44)
AST: 33 IU/L (ref 0–40)
Albumin/Globulin Ratio: 2 (ref 1.2–2.2)
Alkaline Phosphatase: 60 IU/L (ref 39–117)
BUN / CREAT RATIO: 14 (ref 10–24)
BUN: 12 mg/dL (ref 8–27)
Bilirubin Total: 0.4 mg/dL (ref 0.0–1.2)
CALCIUM: 9.4 mg/dL (ref 8.6–10.2)
CO2: 23 mmol/L (ref 18–29)
CREATININE: 0.88 mg/dL (ref 0.76–1.27)
Chloride: 96 mmol/L (ref 96–106)
GFR, EST AFRICAN AMERICAN: 105 mL/min/{1.73_m2} (ref >59)
GFR, EST NON AFRICAN AMERICAN: 91 mL/min/{1.73_m2} (ref >59)
GLUCOSE: 123 mg/dL — AB (ref 65–99)
Globulin, Total: 2.3 g/dL (ref 1.5–4.5)
Potassium: 4.7 mmol/L (ref 3.5–5.2)
Sodium: 147 mmol/L — ABNORMAL HIGH (ref 134–144)
TOTAL PROTEIN: 6.9 g/dL (ref 6.0–8.5)

## 2015-05-10 LAB — LIPID PANEL
CHOL/HDL RATIO: 2.6 ratio (ref 0.0–5.0)
CHOLESTEROL TOTAL: 128 mg/dL (ref 100–199)
HDL: 49 mg/dL (ref >39)
LDL CALC: 61 mg/dL (ref 0–99)
Triglycerides: 91 mg/dL (ref 0–149)
VLDL CHOLESTEROL CAL: 18 mg/dL (ref 5–40)

## 2015-05-16 ENCOUNTER — Telehealth: Payer: Self-pay | Admitting: Nurse Practitioner

## 2015-05-16 ENCOUNTER — Encounter: Payer: Self-pay | Admitting: Internal Medicine

## 2015-05-16 DIAGNOSIS — E119 Type 2 diabetes mellitus without complications: Secondary | ICD-10-CM

## 2015-05-16 DIAGNOSIS — E785 Hyperlipidemia, unspecified: Secondary | ICD-10-CM

## 2015-05-16 MED ORDER — ROSUVASTATIN CALCIUM 20 MG PO TABS: 20.0000 mg | ORAL_TABLET | Freq: Every day | ORAL | Status: DC

## 2015-05-16 MED ORDER — METFORMIN HCL 1000 MG PO TABS: ORAL_TABLET | ORAL | Status: DC

## 2015-05-16 MED ORDER — GLIMEPIRIDE 4 MG PO TABS: 4.0000 mg | ORAL_TABLET | Freq: Every day | ORAL | Status: DC

## 2015-05-16 MED ORDER — AMLODIPINE BESY-BENAZEPRIL HCL 5-20 MG PO CAPS: 1.0000 | ORAL_CAPSULE | Freq: Every day | ORAL | Status: DC

## 2015-05-16 MED ORDER — EZETIMIBE 10 MG PO TABS: 10.0000 mg | ORAL_TABLET | Freq: Every day | ORAL | Status: DC

## 2015-05-16 NOTE — Telephone Encounter (Signed)
Prescription refills were sent to [facility], patient informed

## 2015-06-19 ENCOUNTER — Other Ambulatory Visit: Payer: Self-pay | Admitting: Nurse Practitioner

## 2015-07-04 ENCOUNTER — Ambulatory Visit (AMBULATORY_SURGERY_CENTER): Payer: Self-pay | Admitting: *Deleted

## 2015-07-04 VITALS — Ht 67.0 in | Wt 170.6 lb

## 2015-07-04 DIAGNOSIS — Z86010 Personal history of colonic polyps: Secondary | ICD-10-CM

## 2015-07-04 MED ORDER — NA SULFATE-K SULFATE-MG SULF 17.5-3.13-1.6 GM/177ML PO SOLN: ORAL | Status: DC

## 2015-07-04 NOTE — Progress Notes (Signed)
No egg or soy allergy; no home oxygen used  No anesthesia or intubation problems per pt  No diet medications taken

## 2015-07-06 ENCOUNTER — Encounter: Payer: Self-pay | Admitting: Internal Medicine

## 2015-07-17 ENCOUNTER — Ambulatory Visit (AMBULATORY_SURGERY_CENTER): Payer: BC Managed Care – PPO | Admitting: Internal Medicine

## 2015-07-17 ENCOUNTER — Encounter: Payer: Self-pay | Admitting: Internal Medicine

## 2015-07-17 VITALS — BP 113/74 | HR 52 | Temp 98.0°F | Resp 13 | Ht 67.0 in | Wt 170.0 lb

## 2015-07-17 DIAGNOSIS — Z86010 Personal history of colonic polyps: Secondary | ICD-10-CM | POA: Diagnosis present

## 2015-07-17 DIAGNOSIS — D120 Benign neoplasm of cecum: Secondary | ICD-10-CM

## 2015-07-17 LAB — GLUCOSE, CAPILLARY
GLUCOSE-CAPILLARY: 142 mg/dL — AB (ref 65–99)
Glucose-Capillary: 133 mg/dL — ABNORMAL HIGH (ref 65–99)

## 2015-07-17 MED ORDER — SODIUM CHLORIDE 0.9 % IV SOLN: 500.0000 mL | INTRAVENOUS | Status: DC

## 2015-07-17 NOTE — Progress Notes (Signed)
Report to PACU, RN, vss, BBS= Clear.  

## 2015-07-17 NOTE — Patient Instructions (Signed)
YOU HAD AN ENDOSCOPIC PROCEDURE TODAY AT THE [facility]:   Refer to the procedure report that was given to you for any specific questions about what was found during the examination.  If the procedure report does not answer your questions, please call your gastroenterologist to clarify.  If you requested that your care partner not be given the details of your procedure findings, then the procedure report has been included in a sealed envelope for you to review at your convenience later.  YOU SHOULD EXPECT: Some feelings of bloating in the abdomen. Passage of more gas than usual.  Walking can help get rid of the air that was put into your GI tract during the procedure and reduce the bloating. If you had a lower endoscopy (such as a colonoscopy or flexible sigmoidoscopy) you may notice spotting of blood in your stool or on the toilet paper. If you underwent a bowel prep for your procedure, you may not have a normal bowel movement for a few days.  Please Note:  You might notice some irritation and congestion in your nose or some drainage.  This is from the oxygen used during your procedure.  There is no need for concern and it should clear up in a day or so.  SYMPTOMS TO REPORT IMMEDIATELY:   Following lower endoscopy (colonoscopy or flexible sigmoidoscopy):  Excessive amounts of blood in the stool  Significant tenderness or worsening of abdominal pains  Swelling of the abdomen that is new, acute  Fever of 100F or higher   For urgent or emergent issues, a gastroenterologist can be reached at any hour by calling [phone].   DIET: Your first meal following the procedure should be a small meal and then it is ok to progress to your normal diet. Heavy or fried foods are harder to digest and may make you feel nauseous or bloated.  Likewise, meals heavy in dairy and vegetables can increase bloating.  Drink plenty of fluids but you should avoid alcoholic beverages for 24  hours.  ACTIVITY:  You should plan to take it easy for the rest of today and you should NOT DRIVE or use heavy machinery until tomorrow (because of the sedation medicines used during the test).    FOLLOW UP: Our staff will call the number listed on your records the next business day following your procedure to check on you and address any questions or concerns that you may have regarding the information given to you following your procedure. If we do not reach you, we will leave a message.  However, if you are feeling well and you are not experiencing any problems, there is no need to return our call.  We will assume that you have returned to your regular daily activities without incident.  If any biopsies were taken you will be contacted by phone or by letter within the next 1-3 weeks.  Please call us at [phone] if you have not heard about the biopsies in 3 weeks.    SIGNATURES/CONFIDENTIALITY: You and/or your care partner have signed paperwork which will be entered into your electronic medical record.  These signatures attest to the fact that that the information above on your After Visit Summary has been reviewed and is understood.  Full responsibility of the confidentiality of this discharge information lies with you and/or your care-partner.   Resume medications. Information given on polyps,hemorrhoids and high fiber diet. 

## 2015-07-17 NOTE — Progress Notes (Signed)
Called to room to assist during endoscopic procedure.  Patient ID and intended procedure confirmed with present staff. Received instructions for my participation in the procedure from the performing physician.  

## 2015-07-17 NOTE — Op Note (Signed)
[facility] Patient Name: Rodney Glover Procedure Date: 07/17/2015 7:58 AM MRN: [id number] Endoscopist: Docia Chuck. Henrene Pastor , MD Age: 65 Referring MD:  Date of Birth: [birth year] Gender: Male Procedure:                Colonoscopy, with cold snare polypectomy x 1 Indications:              High risk colon cancer surveillance: Personal                            history of multiple (3 or more) adenomas. Previous                            examinations 2000, 2005, and 2011 Medicines:                Monitored Anesthesia Care Procedure:                Pre-Anesthesia Assessment:                           - Prior to the procedure, a History and Physical                            was performed, and patient medications and                            allergies were reviewed. The patient's tolerance of                            previous anesthesia was also reviewed. The risks                            and benefits of the procedure and the sedation                            options and risks were discussed with the patient.                            All questions were answered, and informed consent                            was obtained. Prior Anticoagulants: The patient has                            taken no previous anticoagulant or antiplatelet                            agents. ASA Grade Assessment: II - A patient with                            mild systemic disease. After reviewing the risks                            and benefits, the patient was deemed in  satisfactory condition to undergo the procedure.                           After obtaining informed consent, the colonoscope                            was passed under direct vision. Throughout the                            procedure, the patient's blood pressure, pulse, and                            oxygen saturations were monitored continuously. The                            Model CF-HQ190L  [id number]) scope was introduced                            through the anus and advanced to the the cecum,                            identified by appendiceal orifice and ileocecal                            valve. The ileocecal valve, appendiceal orifice,                            and rectum were photographed. The quality of the                            bowel preparation was good. The colonoscopy was                            performed without difficulty. The patient tolerated                            the procedure well. The bowel preparation used was                            SUPREP. Scope In: 8:12:54 AM Scope Out: 8:28:27 AM Scope Withdrawal Time: 0 hours 13 minutes 17 seconds  Total Procedure Duration: 0 hours 15 minutes 33 seconds  Findings:                 A 6 mm polyp was found in the cecum. The polyp was                            sessile. The polyp was removed with a cold snare.                            Resection and retrieval were complete.                           Internal hemorrhoids were found during retroflexion.  The exam was otherwise without abnormality on                            direct and retroflexion views. Complications:            No immediate complications. Estimated blood loss:                            None. Estimated Blood Loss:     Estimated blood loss: none. Impression:               - One 6 mm polyp in the cecum, removed with a cold                            snare. Resected and retrieved.                           - Internal hemorrhoids.                           - The examination was otherwise normal on direct                            and retroflexion views. Recommendation:           - Repeat colonoscopy in 5 years for surveillance.                           - Patient has a contact number available for                            emergencies. The signs and symptoms of potential                            delayed  complications were discussed with the                            patient. Return to normal activities tomorrow.                            Written discharge instructions were provided to the                            patient.                           - Resume previous diet.                           - Continue present medications.                           - Await pathology results. Docia Chuck. Henrene Pastor, MD 07/17/2015 8:33:42 AM This report has been signed electronically. CC Letter to:             Chevis Pretty FNP

## 2015-07-18 ENCOUNTER — Telehealth: Payer: Self-pay

## 2015-07-18 NOTE — Telephone Encounter (Signed)
  Follow up Call-  Call back number 07/17/2015  Post procedure Call Back phone  # [phone]  Permission to leave phone message Yes     Patient questions:  Do you have a fever, pain , or abdominal swelling? No. Pain Score  0 *  Have you tolerated food without any problems? Yes.    Have you been able to return to your normal activities? Yes.    Do you have any questions about your discharge instructions: Diet   No. Medications  No. Follow up visit  No.  Do you have questions or concerns about your Care? No.  Actions: * If pain score is 4 or above: No action needed, pain <4.

## 2015-07-25 ENCOUNTER — Encounter: Payer: Self-pay | Admitting: Internal Medicine

## 2015-09-05 ENCOUNTER — Encounter: Payer: Self-pay | Admitting: Nurse Practitioner

## 2015-09-05 ENCOUNTER — Ambulatory Visit (INDEPENDENT_AMBULATORY_CARE_PROVIDER_SITE_OTHER): Payer: BC Managed Care – PPO | Admitting: Nurse Practitioner

## 2015-09-05 VITALS — BP 133/75 | HR 57 | Temp 97.0°F | Ht 67.0 in | Wt 173.0 lb

## 2015-09-05 DIAGNOSIS — Z6828 Body mass index (BMI) 28.0-28.9, adult: Secondary | ICD-10-CM

## 2015-09-05 DIAGNOSIS — E785 Hyperlipidemia, unspecified: Secondary | ICD-10-CM | POA: Diagnosis not present

## 2015-09-05 DIAGNOSIS — E119 Type 2 diabetes mellitus without complications: Secondary | ICD-10-CM

## 2015-09-05 DIAGNOSIS — I10 Essential (primary) hypertension: Secondary | ICD-10-CM

## 2015-09-05 LAB — BAYER DCA HB A1C WAIVED: HB A1C: 6.7 % (ref <7.0)

## 2015-09-05 NOTE — Progress Notes (Signed)
Subjective:    Patient ID: Rodney Glover, male    DOB: December 18, 1950, 65 y.o.   MRN: [id number]  Patient here today for follow up of chronic medical problems.  Outpatient Encounter Prescriptions as of 09/05/2015  Medication Sig  . amLODipine-benazepril (LOTREL) 5-20 MG capsule Take 1 capsule by mouth daily.  Marland Kitchen aspirin 81 MG tablet Take 81 mg by mouth daily.    . Cholecalciferol (VITAMIN D-3) 5000 UNITS TABS Take 10,000 each by mouth daily.  . Cinnamon 500 MG TABS Take by mouth 2 (two) times daily.    Marland Kitchen ezetimibe (ZETIA) 10 MG tablet Take 1 tablet (10 mg total) by mouth daily.  . fish oil-omega-3 fatty acids 1000 MG capsule Take 2 g by mouth daily. 4 per day 1014m per day  . glimepiride (AMARYL) 4 MG tablet TAKE 1 TABLET DAILY BEFORE BREAKFAST  . glucose blood (ACCU-CHEK AVIVA PLUS) test strip Test 1x a day- dx 250.02  . Melatonin 1 MG TABS Take by mouth. At bedtime   . metFORMIN (GLUCOPHAGE) 1000 MG tablet TAKE 1 TABLET TWICE A DAY  WITH MEALS  . Multiple Vitamin (MULTIVITAMIN) capsule Take 1 capsule by mouth daily.    . rosuvastatin (CRESTOR) 20 MG tablet Take 1 tablet (20 mg total) by mouth daily.   No facility-administered encounter medications on file as of 09/05/2015.      Diabetes  He presents for his follow-up diabetic visit. He has type 2 diabetes mellitus. His disease course has been stable. Symptoms are stable. Risk factors for coronary artery disease include dyslipidemia, diabetes mellitus, hypertension and male sex. Current diabetic treatment includes oral agent (dual therapy). He is compliant with treatment most of the time. His weight is stable. He is following a diabetic diet. He has not had a previous visit with a dietitian. He participates in exercise daily. There is no change in his home blood glucose trend. His breakfast blood glucose is taken between 8-9 am. His breakfast blood glucose range is generally 110-130 mg/dl. His highest blood glucose is 140-180 mg/dl. His overall  blood glucose range is 110-130 mg/dl. An ACE inhibitor/angiotensin II receptor blocker is being taken. He does not see a podiatrist.Eye exam is not current.  Hypertension  This is a chronic problem. The current episode started more than 1 year ago. The problem is controlled. Risk factors for coronary artery disease include diabetes mellitus, dyslipidemia and male gender. Past treatments include calcium channel blockers and ACE inhibitors. The current treatment provides moderate improvement. There are no compliance problems.   Hyperlipidemia  This is a chronic problem. The current episode started more than 1 year ago. The problem is controlled. Recent lipid tests were reviewed and are normal. There are no known factors aggravating his hyperlipidemia. Current antihyperlipidemic treatment includes ezetimibe and statins. The current treatment provides significant improvement of lipids. There are no compliance problems.  Risk factors for coronary artery disease include diabetes mellitus, dyslipidemia, hypertension and male sex.     Review of Systems  Constitutional: Negative.   HENT: Negative.   Respiratory: Negative.   Cardiovascular: Negative.   Genitourinary: Negative.   Skin: Negative.   Psychiatric/Behavioral: Negative.   All other systems reviewed and are negative.      Objective:   Physical Exam  Constitutional: He is oriented to person, place, and time. He appears well-developed and well-nourished.  HENT:  Head: Normocephalic.  Right Ear: External ear normal.  Left Ear: External ear normal.  Nose: Nose normal.  Mouth/Throat: Oropharynx is  clear and moist.  Eyes: EOM are normal. Pupils are equal, round, and reactive to light.  Neck: Normal range of motion. Neck supple. No JVD present. No thyromegaly present.  Cardiovascular: Normal rate, regular rhythm, normal heart sounds and intact distal pulses.  Exam reveals no gallop and no friction rub.   No murmur heard. Pulmonary/Chest:  Effort normal and breath sounds normal. No respiratory distress. He has no wheezes. He has no rales. He exhibits no tenderness.  Abdominal: Soft. Bowel sounds are normal. He exhibits no mass. There is no tenderness.  Musculoskeletal: Normal range of motion. He exhibits no edema.  Lymphadenopathy:    He has no cervical adenopathy.  Neurological: He is alert and oriented to person, place, and time. No cranial nerve deficit.  Skin: Skin is warm and dry.  Psychiatric: He has a normal mood and affect. His behavior is normal. Judgment and thought content normal.   BP 133/75   Pulse (!) 57   Temp 97 F (36.1 C) (Oral)   Ht '5\' 7"'  (1.702 m)   Wt 173 lb (78.5 kg)   BMI 27.10 kg/m     HGBA1c- 6.7%    ASSESSMENT AND PLAN  1. Type 2 diabetes mellitus without complication, unspecified long term insulin use status (HCC) Continue carb counting - Bayer DCA Hb A1c Waived  2. Essential hypertension Do not add salt to diet - CMP14+EGFR  3. Hyperlipidemia Low  fat diet - Lipid panel  4. BMI 28.0-28.9,adult Discussed diet and exercise for person with BMI >25 Will recheck weight in 3-6 months     Labs pending Health maintenance reviewed Diet and exercise encouraged Continue all meds Follow up  In 3 month   [place], FNP

## 2015-09-05 NOTE — Patient Instructions (Signed)

## 2015-09-06 LAB — LIPID PANEL
CHOLESTEROL TOTAL: 132 mg/dL (ref 100–199)
Chol/HDL Ratio: 2.6 ratio units (ref 0.0–5.0)
HDL: 51 mg/dL (ref >39)
LDL CALC: 67 mg/dL (ref 0–99)
TRIGLYCERIDES: 68 mg/dL (ref 0–149)
VLDL CHOLESTEROL CAL: 14 mg/dL (ref 5–40)

## 2015-09-06 LAB — CMP14+EGFR
A/G RATIO: 2.3 — AB (ref 1.2–2.2)
ALBUMIN: 4.8 g/dL (ref 3.6–4.8)
ALK PHOS: 59 IU/L (ref 39–117)
ALT: 37 IU/L (ref 0–44)
AST: 39 IU/L (ref 0–40)
BILIRUBIN TOTAL: 0.5 mg/dL (ref 0.0–1.2)
BUN / CREAT RATIO: 13 (ref 10–24)
BUN: 12 mg/dL (ref 8–27)
CHLORIDE: 98 mmol/L (ref 96–106)
CO2: 23 mmol/L (ref 18–29)
CREATININE: 0.89 mg/dL (ref 0.76–1.27)
Calcium: 9.5 mg/dL (ref 8.6–10.2)
GFR calc Af Amer: 104 mL/min/{1.73_m2} (ref >59)
GFR calc non Af Amer: 90 mL/min/{1.73_m2} (ref >59)
GLOBULIN, TOTAL: 2.1 g/dL (ref 1.5–4.5)
Glucose: 127 mg/dL — ABNORMAL HIGH (ref 65–99)
POTASSIUM: 4.5 mmol/L (ref 3.5–5.2)
SODIUM: 139 mmol/L (ref 134–144)
Total Protein: 6.9 g/dL (ref 6.0–8.5)

## 2015-12-22 ENCOUNTER — Other Ambulatory Visit: Payer: Self-pay | Admitting: Nurse Practitioner

## 2015-12-22 DIAGNOSIS — E785 Hyperlipidemia, unspecified: Secondary | ICD-10-CM

## 2016-01-09 ENCOUNTER — Encounter: Payer: Self-pay | Admitting: Nurse Practitioner

## 2016-01-09 ENCOUNTER — Ambulatory Visit (INDEPENDENT_AMBULATORY_CARE_PROVIDER_SITE_OTHER): Payer: BC Managed Care – PPO | Admitting: Nurse Practitioner

## 2016-01-09 VITALS — BP 142/84 | HR 61 | Temp 97.3°F | Ht 67.0 in | Wt 170.4 lb

## 2016-01-09 DIAGNOSIS — E782 Mixed hyperlipidemia: Secondary | ICD-10-CM

## 2016-01-09 DIAGNOSIS — I10 Essential (primary) hypertension: Secondary | ICD-10-CM | POA: Diagnosis not present

## 2016-01-09 DIAGNOSIS — Z6828 Body mass index (BMI) 28.0-28.9, adult: Secondary | ICD-10-CM | POA: Diagnosis not present

## 2016-01-09 DIAGNOSIS — E119 Type 2 diabetes mellitus without complications: Secondary | ICD-10-CM | POA: Diagnosis not present

## 2016-01-09 LAB — BAYER DCA HB A1C WAIVED: HB A1C (BAYER DCA - WAIVED): 7.7 % — ABNORMAL HIGH (ref <7.0)

## 2016-01-09 MED ORDER — GLIMEPIRIDE 4 MG PO TABS: 4.0000 mg | ORAL_TABLET | Freq: Every day | ORAL | 1 refills | Status: DC

## 2016-01-09 MED ORDER — ROSUVASTATIN CALCIUM 20 MG PO TABS: 20.0000 mg | ORAL_TABLET | Freq: Every day | ORAL | 1 refills | Status: DC

## 2016-01-09 MED ORDER — AMLODIPINE BESY-BENAZEPRIL HCL 5-20 MG PO CAPS: 1.0000 | ORAL_CAPSULE | Freq: Every day | ORAL | 1 refills | Status: DC

## 2016-01-09 MED ORDER — METFORMIN HCL 1000 MG PO TABS: 1000.0000 mg | ORAL_TABLET | Freq: Two times a day (BID) | ORAL | 1 refills | Status: DC

## 2016-01-09 NOTE — Patient Instructions (Signed)

## 2016-01-09 NOTE — Progress Notes (Signed)
Subjective:    Patient ID: Rodney Glover, male    DOB: [birth year], 65 y.o.   MRN: [id number]  Patient here today for follow up of chronic medical problems. No changes since last visiit. No complaints today.  Outpatient Encounter Prescriptions as of 01/09/2016  Medication Sig  . amLODipine-benazepril (LOTREL) 5-20 MG capsule Take 1 capsule by mouth daily.  Marland Kitchen aspirin 81 MG tablet Take 81 mg by mouth daily.    . Cholecalciferol (VITAMIN D-3) 5000 UNITS TABS Take 10,000 each by mouth daily.  . Cinnamon 500 MG TABS Take by mouth 2 (two) times daily.    Marland Kitchen ezetimibe (ZETIA) 10 MG tablet TAKE 1 TABLET DAILY  . fish oil-omega-3 fatty acids 1000 MG capsule Take 2 g by mouth daily. 4 per day 1063m per day  . glimepiride (AMARYL) 4 MG tablet TAKE 1 TABLET DAILY BEFORE BREAKFAST  . glucose blood (ACCU-CHEK AVIVA PLUS) test strip Test 1x a day- dx 250.02  . Melatonin 1 MG TABS Take by mouth. At bedtime   . metFORMIN (GLUCOPHAGE) 1000 MG tablet TAKE 1 TABLET TWICE A DAY  WITH MEALS  . Multiple Vitamin (MULTIVITAMIN) capsule Take 1 capsule by mouth daily.    . rosuvastatin (CRESTOR) 20 MG tablet Take 1 tablet (20 mg total) by mouth daily.  . rosuvastatin (CRESTOR) 20 MG tablet TAKE 1 TABLET DAILY  . FLUARIX QUADRIVALENT 0.5 ML injection TO BE ADMINISTERED BY PHARMACIST FOR IMMUNIZATION   No facility-administered encounter medications on file as of 01/09/2016.      Diabetes  He presents for his follow-up diabetic visit. He has type 2 diabetes mellitus. His disease course has been stable. Symptoms are stable. Risk factors for coronary artery disease include dyslipidemia, diabetes mellitus, hypertension and male sex. Current diabetic treatment includes oral agent (dual therapy). He is compliant with treatment most of the time. His weight is stable. He is following a diabetic diet. He has not had a previous visit with a dietitian. He participates in exercise daily. There is no change in his home blood  glucose trend. His breakfast blood glucose is taken between 8-9 am. His breakfast blood glucose range is generally 110-130 mg/dl. His highest blood glucose is 140-180 mg/dl. His overall blood glucose range is 110-130 mg/dl. An ACE inhibitor/angiotensin II receptor blocker is being taken. He does not see a podiatrist.Eye exam is not current.  Hypertension  This is a chronic problem. The current episode started more than 1 year ago. The problem is controlled. Risk factors for coronary artery disease include diabetes mellitus, dyslipidemia and male gender. Past treatments include calcium channel blockers and ACE inhibitors. The current treatment provides moderate improvement. There are no compliance problems.   Hyperlipidemia  This is a chronic problem. The current episode started more than 1 year ago. The problem is controlled. Recent lipid tests were reviewed and are normal. There are no known factors aggravating his hyperlipidemia. Current antihyperlipidemic treatment includes ezetimibe and statins. The current treatment provides significant improvement of lipids. There are no compliance problems.  Risk factors for coronary artery disease include diabetes mellitus, dyslipidemia, hypertension and male sex.     Review of Systems  Constitutional: Negative.   HENT: Negative.   Respiratory: Negative.   Cardiovascular: Negative.   Genitourinary: Negative.   Skin: Negative.   Psychiatric/Behavioral: Negative.   All other systems reviewed and are negative.      Objective:   Physical Exam  Constitutional: He is oriented to person, place, and time. He appears  well-developed and well-nourished.  HENT:  Head: Normocephalic.  Right Ear: External ear normal.  Left Ear: External ear normal.  Nose: Nose normal.  Mouth/Throat: Oropharynx is clear and moist.  Eyes: EOM are normal. Pupils are equal, round, and reactive to light.  Neck: Normal range of motion. Neck supple. No JVD present. No thyromegaly  present.  Cardiovascular: Normal rate, regular rhythm, normal heart sounds and intact distal pulses.  Exam reveals no gallop and no friction rub.   No murmur heard. Pulmonary/Chest: Effort normal and breath sounds normal. No respiratory distress. He has no wheezes. He has no rales. He exhibits no tenderness.  Abdominal: Soft. Bowel sounds are normal. He exhibits no mass. There is no tenderness.  Musculoskeletal: Normal range of motion. He exhibits no edema.  Lymphadenopathy:    He has no cervical adenopathy.  Neurological: He is alert and oriented to person, place, and time. No cranial nerve deficit.  Skin: Skin is warm and dry.  Psychiatric: He has a normal mood and affect. His behavior is normal. Judgment and thought content normal.    BP (!) 142/84 (BP Location: Left Arm, Cuff Size: Normal)   Pulse 61   Temp 97.3 F (36.3 C) (Oral)   Ht _0  (1.702 m)   Wt 170 lb 6.4 oz (77.3 kg)   BMI 26.69 kg/m     HGBA1c- 7.7%   ASSESSMENT AND PLAN  1. Essential hypertension Low sodium diet - amLODipine-benazepril (LOTREL) 5-20 MG capsule; Take 1 capsule by mouth daily.  Dispense: 90 capsule; Refill: 1 - CMP14+EGFR  2. Type 2 diabetes mellitus without complication, unspecified long term insulin use status (HCC) Continue to watch carbs in diet Patient wants to try to get hgba1c down by himself- he has done in the past. - metFORMIN (GLUCOPHAGE) 1000 MG tablet; Take 1 tablet (1,000 mg total) by mouth 2 (two) times daily with a meal.  Dispense: 180 tablet; Refill: 1 - glimepiride (AMARYL) 4 MG tablet; Take 1 tablet (4 mg total) by mouth daily before breakfast.  Dispense: 90 tablet; Refill: 1 - Bayer DCA Hb A1c Waived  3. BMI 28.0-28.9,adult Discussed diet and exercise for person with BMI >25 Will recheck weight in 3-6 months  4. Mixed hyperlipidemia Low fat diet - rosuvastatin (CRESTOR) 20 MG tablet; Take 1 tablet (20 mg total) by mouth daily.  Dispense: 90 tablet; Refill: 1 -  rosuvastatin (CRESTOR) 20 MG tablet; Take 1 tablet (20 mg total) by mouth daily.  Dispense: 90 tablet; Refill: 1 - Lipid panel    Labs pending Health maintenance reviewed Diet and exercise encouraged Continue all meds Follow up  In 3 month  [place], FNP

## 2016-01-10 LAB — CMP14+EGFR
A/G RATIO: 2 (ref 1.2–2.2)
ALBUMIN: 4.5 g/dL (ref 3.6–4.8)
ALT: 34 IU/L (ref 0–44)
AST: 35 IU/L (ref 0–40)
Alkaline Phosphatase: 65 IU/L (ref 39–117)
BILIRUBIN TOTAL: 0.5 mg/dL (ref 0.0–1.2)
BUN / CREAT RATIO: 11 (ref 10–24)
BUN: 9 mg/dL (ref 8–27)
CHLORIDE: 100 mmol/L (ref 96–106)
CO2: 27 mmol/L (ref 18–29)
Calcium: 9.2 mg/dL (ref 8.6–10.2)
Creatinine, Ser: 0.8 mg/dL (ref 0.76–1.27)
GFR calc non Af Amer: 94 mL/min/{1.73_m2} (ref >59)
GFR, EST AFRICAN AMERICAN: 109 mL/min/{1.73_m2} (ref >59)
GLOBULIN, TOTAL: 2.3 g/dL (ref 1.5–4.5)
GLUCOSE: 134 mg/dL — AB (ref 65–99)
Potassium: 4.5 mmol/L (ref 3.5–5.2)
Sodium: 142 mmol/L (ref 134–144)
TOTAL PROTEIN: 6.8 g/dL (ref 6.0–8.5)

## 2016-01-10 LAB — LIPID PANEL
Chol/HDL Ratio: 2.6 ratio units (ref 0.0–5.0)
Cholesterol, Total: 128 mg/dL (ref 100–199)
HDL: 50 mg/dL (ref >39)
LDL Calculated: 66 mg/dL (ref 0–99)
Triglycerides: 59 mg/dL (ref 0–149)
VLDL CHOLESTEROL CAL: 12 mg/dL (ref 5–40)

## 2016-01-15 ENCOUNTER — Other Ambulatory Visit: Payer: Self-pay | Admitting: Nurse Practitioner

## 2016-01-15 DIAGNOSIS — E119 Type 2 diabetes mellitus without complications: Secondary | ICD-10-CM

## 2016-02-19 ENCOUNTER — Other Ambulatory Visit: Payer: Self-pay | Admitting: Nurse Practitioner

## 2016-02-19 DIAGNOSIS — I10 Essential (primary) hypertension: Secondary | ICD-10-CM

## 2016-03-07 LAB — HM DIABETES EYE EXAM

## 2016-03-15 ENCOUNTER — Other Ambulatory Visit: Payer: Self-pay | Admitting: Nurse Practitioner

## 2016-03-15 DIAGNOSIS — E119 Type 2 diabetes mellitus without complications: Secondary | ICD-10-CM

## 2016-03-18 ENCOUNTER — Telehealth: Payer: Self-pay | Admitting: Nurse Practitioner

## 2016-03-18 DIAGNOSIS — E782 Mixed hyperlipidemia: Secondary | ICD-10-CM

## 2016-03-18 DIAGNOSIS — E119 Type 2 diabetes mellitus without complications: Secondary | ICD-10-CM

## 2016-03-18 MED ORDER — GLIMEPIRIDE 4 MG PO TABS: 4.0000 mg | ORAL_TABLET | Freq: Every day | ORAL | 1 refills | Status: DC

## 2016-03-18 MED ORDER — EZETIMIBE 10 MG PO TABS: 10.0000 mg | ORAL_TABLET | Freq: Every day | ORAL | 1 refills | Status: DC

## 2016-03-18 MED ORDER — ROSUVASTATIN CALCIUM 20 MG PO TABS: 20.0000 mg | ORAL_TABLET | Freq: Every day | ORAL | 1 refills | Status: DC

## 2016-03-18 NOTE — Telephone Encounter (Signed)
done

## 2016-04-11 ENCOUNTER — Other Ambulatory Visit: Payer: Self-pay | Admitting: *Deleted

## 2016-04-11 DIAGNOSIS — E119 Type 2 diabetes mellitus without complications: Secondary | ICD-10-CM

## 2016-04-11 DIAGNOSIS — I10 Essential (primary) hypertension: Secondary | ICD-10-CM

## 2016-04-11 MED ORDER — METFORMIN HCL 1000 MG PO TABS: 1000.0000 mg | ORAL_TABLET | Freq: Two times a day (BID) | ORAL | 1 refills | Status: DC

## 2016-04-11 MED ORDER — AMLODIPINE BESY-BENAZEPRIL HCL 5-20 MG PO CAPS: 1.0000 | ORAL_CAPSULE | Freq: Every day | ORAL | 1 refills | Status: DC

## 2016-05-07 ENCOUNTER — Encounter: Payer: Self-pay | Admitting: Nurse Practitioner

## 2016-05-07 ENCOUNTER — Ambulatory Visit (INDEPENDENT_AMBULATORY_CARE_PROVIDER_SITE_OTHER): Payer: Medicare Other | Admitting: Nurse Practitioner

## 2016-05-07 VITALS — BP 139/77 | HR 60 | Temp 97.2°F | Ht 67.0 in | Wt 168.0 lb

## 2016-05-07 DIAGNOSIS — Z23 Encounter for immunization: Secondary | ICD-10-CM | POA: Diagnosis not present

## 2016-05-07 DIAGNOSIS — I10 Essential (primary) hypertension: Secondary | ICD-10-CM | POA: Diagnosis not present

## 2016-05-07 DIAGNOSIS — E782 Mixed hyperlipidemia: Secondary | ICD-10-CM

## 2016-05-07 DIAGNOSIS — E119 Type 2 diabetes mellitus without complications: Secondary | ICD-10-CM

## 2016-05-07 DIAGNOSIS — Z6826 Body mass index (BMI) 26.0-26.9, adult: Secondary | ICD-10-CM

## 2016-05-07 LAB — CMP14+EGFR
ALT: 30 IU/L (ref 0–44)
AST: 34 IU/L (ref 0–40)
Albumin/Globulin Ratio: 2 (ref 1.2–2.2)
Albumin: 4.5 g/dL (ref 3.6–4.8)
Alkaline Phosphatase: 58 IU/L (ref 39–117)
BUN/Creatinine Ratio: 17 (ref 10–24)
BUN: 14 mg/dL (ref 8–27)
Bilirubin Total: 0.4 mg/dL (ref 0.0–1.2)
CALCIUM: 9.2 mg/dL (ref 8.6–10.2)
CO2: 23 mmol/L (ref 18–29)
Chloride: 102 mmol/L (ref 96–106)
Creatinine, Ser: 0.81 mg/dL (ref 0.76–1.27)
GFR, EST AFRICAN AMERICAN: 108 mL/min/{1.73_m2} (ref >59)
GFR, EST NON AFRICAN AMERICAN: 93 mL/min/{1.73_m2} (ref >59)
GLUCOSE: 138 mg/dL — AB (ref 65–99)
Globulin, Total: 2.3 g/dL (ref 1.5–4.5)
Potassium: 5.3 mmol/L — ABNORMAL HIGH (ref 3.5–5.2)
Sodium: 140 mmol/L (ref 134–144)
TOTAL PROTEIN: 6.8 g/dL (ref 6.0–8.5)

## 2016-05-07 LAB — LIPID PANEL
CHOL/HDL RATIO: 2.6 ratio (ref 0.0–5.0)
Cholesterol, Total: 123 mg/dL (ref 100–199)
HDL: 48 mg/dL (ref >39)
LDL Calculated: 62 mg/dL (ref 0–99)
TRIGLYCERIDES: 66 mg/dL (ref 0–149)
VLDL Cholesterol Cal: 13 mg/dL (ref 5–40)

## 2016-05-07 LAB — BAYER DCA HB A1C WAIVED: HB A1C: 6.8 % (ref <7.0)

## 2016-05-07 MED ORDER — GLUCOSE BLOOD VI STRP: ORAL_STRIP | 11 refills | Status: DC

## 2016-05-07 NOTE — Addendum Note (Signed)
Addended by: Rolena Infante on: 05/07/2016 09:20 AM   Modules accepted: Orders

## 2016-05-07 NOTE — Progress Notes (Signed)
Subjective:    Patient ID: Rodney Glover, male    DOB: [birth year], 66 y.o.   MRN: [id number]  HPI  ARVID MARENGO is here today for follow up of chronic medical problem.  Outpatient Encounter Prescriptions as of 05/07/2016  Medication Sig  . amLODipine-benazepril (LOTREL) 5-20 MG capsule Take 1 capsule by mouth daily.  Marland Kitchen aspirin 81 MG tablet Take 81 mg by mouth daily.    . Cholecalciferol (VITAMIN D-3) 5000 UNITS TABS Take 10,000 each by mouth daily.  . Cinnamon 500 MG TABS Take by mouth 2 (two) times daily.    Marland Kitchen ezetimibe (ZETIA) 10 MG tablet Take 1 tablet (10 mg total) by mouth daily.  . fish oil-omega-3 fatty acids 1000 MG capsule Take 2 g by mouth daily. 4 per day 1062m per day  . FLUARIX QUADRIVALENT 0.5 ML injection TO BE ADMINISTERED BY PHARMACIST FOR IMMUNIZATION  . glimepiride (AMARYL) 4 MG tablet Take 1 tablet (4 mg total) by mouth daily before breakfast.  . glucose blood (ACCU-CHEK AVIVA PLUS) test strip Test 1x a day- dx 250.02  . Melatonin 1 MG TABS Take by mouth. At bedtime   . metFORMIN (GLUCOPHAGE) 1000 MG tablet Take 1 tablet (1,000 mg total) by mouth 2 (two) times daily with a meal.  . Multiple Vitamin (MULTIVITAMIN) capsule Take 1 capsule by mouth daily.    . rosuvastatin (CRESTOR) 20 MG tablet Take 1 tablet (20 mg total) by mouth daily.   No facility-administered encounter medications on file as of 05/07/2016.     1. Essential hypertension  Currently on lotrel without in report of lower ext edema. Denies headaches, chest pain and blurred vision.  2. Type 2 diabetes mellitus without complication, unspecified long term insulin use status ([facility]  Still on amaryl and metformin. CHecks blood sugars on occasion and average around 110-130 fasting  3. BMI 26.0-26.9,adult  No recent weight changes  4. Mixed hyperlipidemia  Tries to eat low fat diet ad exercise- plays golf 2-3 days a week and walks course.     New complaints: No new complaint stoday   Review of  Systems  Constitutional: Negative.   HENT: Negative.   Respiratory: Negative.   Cardiovascular: Negative.   Gastrointestinal: Negative.   Genitourinary: Negative.   Neurological: Negative.   Psychiatric/Behavioral: Negative.   All other systems reviewed and are negative.      Objective:   Physical Exam  Constitutional: He is oriented to person, place, and time. He appears well-developed and well-nourished.  HENT:  Head: Normocephalic.  Right Ear: External ear normal.  Left Ear: External ear normal.  Nose: Nose normal.  Mouth/Throat: Oropharynx is clear and moist.  Eyes: EOM are normal. Pupils are equal, round, and reactive to light.  Neck: Normal range of motion. Neck supple. No JVD present. No thyromegaly present.  Cardiovascular: Normal rate, regular rhythm, normal heart sounds and intact distal pulses.  Exam reveals no gallop and no friction rub.   No murmur heard. Pulmonary/Chest: Effort normal and breath sounds normal. No respiratory distress. He has no wheezes. He has no rales. He exhibits no tenderness.  Abdominal: Soft. Bowel sounds are normal. He exhibits no mass. There is no tenderness.  Musculoskeletal: Normal range of motion. He exhibits no edema.  Lymphadenopathy:    He has no cervical adenopathy.  Neurological: He is alert and oriented to person, place, and time. No cranial nerve deficit.  Skin: Skin is warm and dry.  Psychiatric: He has a normal mood  and affect. His behavior is normal. Judgment and thought content normal.   BP 139/77   Pulse 60   Temp 97.2 F (36.2 C) (Oral)   Ht '5\' 7"'  (1.702 m)   Wt 168 lb (76.2 kg)   BMI 26.31 kg/m   Hgba1c- 6.8% down from 7.7% at last visit     Assessment & Plan:  1. Essential hypertension Low sodium diet - CMP14+EGFR  2. Type 2 diabetes mellitus without complication, unspecified long term insulin use status (HCC) Continue to count carbsin diet - Bayer DCA Hb A1c Waived - Microalbumin / creatinine urine  ratio - glucose blood (ACCU-CHEK AVIVA PLUS) test strip; Test 1x a day- dx 250.02  Dispense: 300 each; Refill: 11  3. BMI 26.0-26.9,adult Discussed diet and exercise for person with BMI >25 Will recheck weight in 3-6 months  4. Mixed hyperlipidemia Low fat diet - Lipid panel   Labs pending Health maintenance reviewed Diet and exercise encouraged Continue all meds Follow up  In 3 months   [place], FNP

## 2016-05-07 NOTE — Patient Instructions (Signed)

## 2016-05-07 NOTE — Addendum Note (Signed)
Addended by: Chevis Pretty on: 05/07/2016 08:38 AM   Modules accepted: Orders

## 2016-07-03 ENCOUNTER — Encounter: Payer: Self-pay | Admitting: *Deleted

## 2016-08-01 ENCOUNTER — Other Ambulatory Visit: Payer: Self-pay | Admitting: Nurse Practitioner

## 2016-08-01 DIAGNOSIS — E782 Mixed hyperlipidemia: Secondary | ICD-10-CM

## 2016-08-01 DIAGNOSIS — E119 Type 2 diabetes mellitus without complications: Secondary | ICD-10-CM

## 2016-08-07 ENCOUNTER — Other Ambulatory Visit: Payer: Self-pay | Admitting: Nurse Practitioner

## 2016-08-07 DIAGNOSIS — E119 Type 2 diabetes mellitus without complications: Secondary | ICD-10-CM

## 2016-09-05 ENCOUNTER — Ambulatory Visit: Payer: Medicare Other | Admitting: Nurse Practitioner

## 2016-09-09 ENCOUNTER — Encounter: Payer: Self-pay | Admitting: Nurse Practitioner

## 2016-09-09 ENCOUNTER — Ambulatory Visit (INDEPENDENT_AMBULATORY_CARE_PROVIDER_SITE_OTHER): Payer: Medicare Other | Admitting: Nurse Practitioner

## 2016-09-09 VITALS — BP 138/77 | HR 55 | Temp 97.9°F | Ht 67.0 in | Wt 170.0 lb

## 2016-09-09 DIAGNOSIS — E119 Type 2 diabetes mellitus without complications: Secondary | ICD-10-CM | POA: Diagnosis not present

## 2016-09-09 DIAGNOSIS — Z86010 Personal history of colonic polyps: Secondary | ICD-10-CM

## 2016-09-09 DIAGNOSIS — Z6828 Body mass index (BMI) 28.0-28.9, adult: Secondary | ICD-10-CM

## 2016-09-09 DIAGNOSIS — E782 Mixed hyperlipidemia: Secondary | ICD-10-CM

## 2016-09-09 DIAGNOSIS — I10 Essential (primary) hypertension: Secondary | ICD-10-CM | POA: Diagnosis not present

## 2016-09-09 LAB — BAYER DCA HB A1C WAIVED: HB A1C: 6.8 % (ref <7.0)

## 2016-09-09 MED ORDER — GLIMEPIRIDE 4 MG PO TABS: ORAL_TABLET | ORAL | 1 refills | Status: DC

## 2016-09-09 MED ORDER — METFORMIN HCL 1000 MG PO TABS: ORAL_TABLET | ORAL | 1 refills | Status: DC

## 2016-09-09 MED ORDER — GLUCOSE BLOOD VI STRP: ORAL_STRIP | 11 refills | Status: DC

## 2016-09-09 MED ORDER — AMLODIPINE BESY-BENAZEPRIL HCL 5-20 MG PO CAPS: 1.0000 | ORAL_CAPSULE | Freq: Every day | ORAL | 1 refills | Status: DC

## 2016-09-09 MED ORDER — ROSUVASTATIN CALCIUM 20 MG PO TABS: 20.0000 mg | ORAL_TABLET | Freq: Every day | ORAL | 1 refills | Status: DC

## 2016-09-09 NOTE — Progress Notes (Signed)
Subjective:    Patient ID: Rodney Glover, male    DOB: Oct 03, 1950, 66 y.o.   MRN: [id number]  HPI  Rodney Glover is here today for follow up of chronic medical problem.  Outpatient Encounter Prescriptions as of 09/09/2016  Medication Sig  . amLODipine-benazepril (LOTREL) 5-20 MG capsule Take 1 capsule by mouth daily.  Marland Kitchen aspirin 81 MG tablet Take 81 mg by mouth daily.    . Cholecalciferol (VITAMIN D-3) 5000 UNITS TABS Take 10,000 each by mouth daily.  . Cinnamon 500 MG TABS Take by mouth 2 (two) times daily.    Marland Kitchen ezetimibe (ZETIA) 10 MG tablet TAKE 1 TABLET BY MOUTH  DAILY  . fish oil-omega-3 fatty acids 1000 MG capsule Take 2 g by mouth daily. 4 per day 1062m per day  . glimepiride (AMARYL) 4 MG tablet TAKE 1 TABLET BY MOUTH  DAILY BEFORE BREAKFAST  . glucose blood (ACCU-CHEK AVIVA PLUS) test strip Test 1x a day- dx 250.02  . Melatonin 1 MG TABS Take by mouth. At bedtime   . metFORMIN (GLUCOPHAGE) 1000 MG tablet TAKE 1 TABLET BY MOUTH TWO  TIMES DAILY WITH MEALS  . Multiple Vitamin (MULTIVITAMIN) capsule Take 1 capsule by mouth daily.    . rosuvastatin (CRESTOR) 20 MG tablet TAKE 1 TABLET BY MOUTH  DAILY   No facility-administered encounter medications on file as of 09/09/2016.     1. Essential hypertension  No c/o chest pain, sob or headache. Does not check blood pressures at home  2. Mixed hyperlipidemia  Watches diet- exercises 3-4 x a week playing golf and walking the course  3. Type 2 diabetes mellitus without complication, without long-term current use of insulin (HCC) last hgab1c 6.8%- watches diet doe snot check blood sugars every day  4. Hx of adenomatous colonic polyps  No blood in stools that he has noticed  5. BMI 28.0-28.9,adult  No change in weight    New complaints: None today  Social history: Plays golf 3 x a week- keeps his granddaughter daily     Review of Systems  Constitutional: Negative for activity change and appetite change.  HENT: Negative.     Eyes: Negative for pain.  Respiratory: Negative for shortness of breath.   Cardiovascular: Negative for chest pain, palpitations and leg swelling.  Gastrointestinal: Negative for abdominal pain.  Endocrine: Negative for polydipsia.  Genitourinary: Negative.   Skin: Negative for rash.  Neurological: Negative for dizziness, weakness and headaches.  Hematological: Does not bruise/bleed easily.  Psychiatric/Behavioral: Negative.   All other systems reviewed and are negative.      Objective:   Physical Exam  Constitutional: He is oriented to person, place, and time. He appears well-developed and well-nourished.  HENT:  Head: Normocephalic.  Right Ear: External ear normal.  Left Ear: External ear normal.  Nose: Nose normal.  Mouth/Throat: Oropharynx is clear and moist.  Eyes: Pupils are equal, round, and reactive to light. EOM are normal.  Neck: Normal range of motion. Neck supple. No JVD present. No thyromegaly present.  Cardiovascular: Normal rate, regular rhythm, normal heart sounds and intact distal pulses.  Exam reveals no gallop and no friction rub.   No murmur heard. Pulmonary/Chest: Effort normal and breath sounds normal. No respiratory distress. He has no wheezes. He has no rales. He exhibits no tenderness.  Abdominal: Soft. Bowel sounds are normal. He exhibits no mass. There is no tenderness.  Genitourinary: Prostate normal and penis normal.  Musculoskeletal: Normal range of motion. He  exhibits no edema.  Lymphadenopathy:    He has no cervical adenopathy.  Neurological: He is alert and oriented to person, place, and time. No cranial nerve deficit.  Skin: Skin is warm and dry.  Psychiatric: He has a normal mood and affect. His behavior is normal. Judgment and thought content normal.    BP 138/77   Pulse (!) 55   Temp 97.9 F (36.6 C) (Oral)   Ht '5\' 7"'  (1.702 m)   Wt 170 lb (77.1 kg)   BMI 26.63 kg/m        Assessment & Plan:  1. Essential hypertension Low  sodium diet - CMP14+EGFR - amLODipine-benazepril (LOTREL) 5-20 MG capsule; Take 1 capsule by mouth daily.  Dispense: 90 capsule; Refill: 1  2. Mixed hyperlipidemia Low fat diet - Lipid panel - rosuvastatin (CRESTOR) 20 MG tablet; Take 1 tablet (20 mg total) by mouth daily.  Dispense: 90 tablet; Refill: 1  3. Type 2 diabetes mellitus without complication, without long-term current use of insulin (HCC) Continue to watch carbs in diet Continue exercise with golf - Bayer DCA Hb A1c Waived - glimepiride (AMARYL) 4 MG tablet; TAKE 1 TABLET BY MOUTH  DAILY BEFORE BREAKFAST  Dispense: 90 tablet; Refill: 1 - metFORMIN (GLUCOPHAGE) 1000 MG tablet; TAKE 1 TABLET BY MOUTH TWO  TIMES DAILY WITH MEALS  Dispense: 180 tablet; Refill: 1  4. Hx of adenomatous colonic polyps Colonoscopy not duet yet  5. BMI 28.0-28.9,adult Discussed diet and exercise for person with BMI >25 Will recheck weight in 3-6 months    Labs pending Health maintenance reviewed Diet and exercise encouraged Continue all meds Follow up  In 3 months   [place], FNP

## 2016-09-09 NOTE — Patient Instructions (Signed)
Exercising to Stay Healthy Exercising regularly is important. It has many health benefits, such as:  Improving your overall fitness, flexibility, and endurance.  Increasing your bone density.  Helping with weight control.  Decreasing your body fat.  Increasing your muscle strength.  Reducing stress and tension.  Improving your overall health.  In order to become healthy and stay healthy, it is recommended that you do moderate-intensity and vigorous-intensity exercise. You can tell that you are exercising at a moderate intensity if you have a higher heart rate and faster breathing, but you are still able to hold a conversation. You can tell that you are exercising at a vigorous intensity if you are breathing much harder and faster and cannot hold a conversation while exercising. How often should I exercise? Choose an activity that you enjoy and set realistic goals. Your health care provider can help you to make an activity plan that works for you. Exercise regularly as directed by your health care provider. This may include:  Doing resistance training twice each week, such as: ? Push-ups. ? Sit-ups. ? Lifting weights. ? Using resistance bands.  Doing a given intensity of exercise for a given amount of time. Choose from these options: ? 150 minutes of moderate-intensity exercise every week. ? 75 minutes of vigorous-intensity exercise every week. ? A mix of moderate-intensity and vigorous-intensity exercise every week.  Children, pregnant women, people who are out of shape, people who are overweight, and older adults may need to consult a health care provider for individual recommendations. If you have any sort of medical condition, be sure to consult your health care provider before starting a new exercise program. What are some exercise ideas? Some moderate-intensity exercise ideas include:  Walking at a rate of 1 mile in 15  minutes.  Biking.  Hiking.  Golfing.  Dancing.  Some vigorous-intensity exercise ideas include:  Walking at a rate of at least 4.5 miles per hour.  Jogging or running at a rate of 5 miles per hour.  Biking at a rate of at least 10 miles per hour.  Lap swimming.  Roller-skating or in-line skating.  Cross-country skiing.  Vigorous competitive sports, such as football, basketball, and soccer.  Jumping rope.  Aerobic dancing.  What are some everyday activities that can help me to get exercise?  Yard work, such as: ? Pushing a lawn mower. ? Raking and bagging leaves.  Washing and waxing your car.  Pushing a stroller.  Shoveling snow.  Gardening.  Washing windows or floors. How can I be more active in my day-to-day activities?  Use the stairs instead of the elevator.  Take a walk during your lunch break.  If you drive, park your car farther away from work or school.  If you take public transportation, get off one stop early and walk the rest of the way.  Make all of your phone calls while standing up and walking around.  Get up, stretch, and walk around every 30 minutes throughout the day. What guidelines should I follow while exercising?  Do not exercise so much that you hurt yourself, feel dizzy, or get very short of breath.  Consult your health care provider before starting a new exercise program.  Wear comfortable clothes and shoes with good support.  Drink plenty of water while you exercise to prevent dehydration or heat stroke. Body water is lost during exercise and must be replaced.  Work out until you breathe faster and your heart beats faster. This information is not   intended to replace advice given to you by your health care provider. Make sure you discuss any questions you have with your health care provider. Document Released: 02/23/2010 Document Revised: 06/29/2015 Document Reviewed: 06/24/2013 Elsevier Interactive Patient Education  2018  [employer]  

## 2016-09-09 NOTE — Addendum Note (Signed)
Addended by: Chevis Pretty on: 09/09/2016 09:50 AM   Modules accepted: Orders

## 2016-09-10 LAB — LIPID PANEL
CHOL/HDL RATIO: 2.3 ratio (ref 0.0–5.0)
Cholesterol, Total: 113 mg/dL (ref 100–199)
HDL: 49 mg/dL (ref >39)
LDL CALC: 54 mg/dL (ref 0–99)
TRIGLYCERIDES: 52 mg/dL (ref 0–149)
VLDL Cholesterol Cal: 10 mg/dL (ref 5–40)

## 2016-09-10 LAB — CMP14+EGFR
A/G RATIO: 1.9 (ref 1.2–2.2)
ALBUMIN: 4.3 g/dL (ref 3.6–4.8)
ALT: 22 IU/L (ref 0–44)
AST: 27 IU/L (ref 0–40)
Alkaline Phosphatase: 61 IU/L (ref 39–117)
BUN / CREAT RATIO: 14 (ref 10–24)
BUN: 11 mg/dL (ref 8–27)
Bilirubin Total: 0.5 mg/dL (ref 0.0–1.2)
CALCIUM: 9.3 mg/dL (ref 8.6–10.2)
CO2: 25 mmol/L (ref 20–29)
CREATININE: 0.77 mg/dL (ref 0.76–1.27)
Chloride: 101 mmol/L (ref 96–106)
GFR, EST AFRICAN AMERICAN: 110 mL/min/{1.73_m2} (ref >59)
GFR, EST NON AFRICAN AMERICAN: 95 mL/min/{1.73_m2} (ref >59)
GLOBULIN, TOTAL: 2.3 g/dL (ref 1.5–4.5)
Glucose: 126 mg/dL — ABNORMAL HIGH (ref 65–99)
Potassium: 5.4 mmol/L — ABNORMAL HIGH (ref 3.5–5.2)
SODIUM: 140 mmol/L (ref 134–144)
Total Protein: 6.6 g/dL (ref 6.0–8.5)

## 2016-09-11 ENCOUNTER — Other Ambulatory Visit: Payer: Self-pay | Admitting: *Deleted

## 2016-09-11 DIAGNOSIS — E875 Hyperkalemia: Secondary | ICD-10-CM

## 2016-09-12 ENCOUNTER — Other Ambulatory Visit: Payer: Medicare Other

## 2016-09-12 DIAGNOSIS — E875 Hyperkalemia: Secondary | ICD-10-CM

## 2016-09-12 LAB — POTASSIUM: Potassium: 4.8 mmol/L (ref 3.5–5.2)

## 2016-10-08 ENCOUNTER — Telehealth: Payer: Self-pay | Admitting: Nurse Practitioner

## 2016-10-08 NOTE — Telephone Encounter (Signed)
Contacted [facility] with Dr. Autumn Patty NPI# [id number] to pt that [facility] has information to process Rx

## 2016-10-08 NOTE — Telephone Encounter (Signed)
[facility] is not filling Ezetimibie 10mg  because mmm wrote it and is not an MD so he needs it called in okayed by an MD today Phone # [phone]. Needs to be done today because they are going out of town this Friday. They said if we called it into day he could get it before he leaves out of town

## 2016-12-17 ENCOUNTER — Ambulatory Visit (INDEPENDENT_AMBULATORY_CARE_PROVIDER_SITE_OTHER): Payer: Medicare Other | Admitting: *Deleted

## 2016-12-17 DIAGNOSIS — Z23 Encounter for immunization: Secondary | ICD-10-CM | POA: Diagnosis not present

## 2016-12-24 ENCOUNTER — Other Ambulatory Visit: Payer: Self-pay | Admitting: Nurse Practitioner

## 2016-12-30 ENCOUNTER — Other Ambulatory Visit: Payer: Self-pay | Admitting: *Deleted

## 2017-01-09 ENCOUNTER — Ambulatory Visit: Payer: Medicare Other | Admitting: Nurse Practitioner

## 2017-01-09 ENCOUNTER — Encounter: Payer: Self-pay | Admitting: Nurse Practitioner

## 2017-01-09 VITALS — BP 131/77 | HR 59 | Temp 97.4°F | Ht 67.0 in | Wt 170.0 lb

## 2017-01-09 DIAGNOSIS — E782 Mixed hyperlipidemia: Secondary | ICD-10-CM | POA: Diagnosis not present

## 2017-01-09 DIAGNOSIS — I10 Essential (primary) hypertension: Secondary | ICD-10-CM

## 2017-01-09 DIAGNOSIS — E119 Type 2 diabetes mellitus without complications: Secondary | ICD-10-CM

## 2017-01-09 DIAGNOSIS — Z6828 Body mass index (BMI) 28.0-28.9, adult: Secondary | ICD-10-CM | POA: Diagnosis not present

## 2017-01-09 LAB — BAYER DCA HB A1C WAIVED: HB A1C: 6.9 % (ref <7.0)

## 2017-01-09 MED ORDER — AMLODIPINE BESY-BENAZEPRIL HCL 5-20 MG PO CAPS: 1.0000 | ORAL_CAPSULE | Freq: Every day | ORAL | 1 refills | Status: DC

## 2017-01-09 MED ORDER — METFORMIN HCL 1000 MG PO TABS: ORAL_TABLET | ORAL | 1 refills | Status: DC

## 2017-01-09 MED ORDER — ROSUVASTATIN CALCIUM 20 MG PO TABS: 20.0000 mg | ORAL_TABLET | Freq: Every day | ORAL | 1 refills | Status: DC

## 2017-01-09 MED ORDER — EZETIMIBE 10 MG PO TABS: 10.0000 mg | ORAL_TABLET | Freq: Every day | ORAL | 1 refills | Status: DC

## 2017-01-09 MED ORDER — GLIMEPIRIDE 4 MG PO TABS: ORAL_TABLET | ORAL | 1 refills | Status: DC

## 2017-01-09 NOTE — Progress Notes (Signed)
Subjective:    Patient ID: Rodney Glover, male    DOB: 06-22-1950, 66 y.o.   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  Outpatient Encounter Medications as of 01/09/2017  Medication Sig  . amLODipine-benazepril (LOTREL) 5-20 MG capsule Take 1 capsule by mouth daily.  Marland Kitchen aspirin 81 MG tablet Take 81 mg by mouth daily.    . Cholecalciferol (VITAMIN D-3) 5000 UNITS TABS Take 10,000 each by mouth daily.  . Cinnamon 500 MG TABS Take by mouth 2 (two) times daily.    Marland Kitchen ezetimibe (ZETIA) 10 MG tablet TAKE 1 TABLET BY MOUTH  DAILY  . fish oil-omega-3 fatty acids 1000 MG capsule Take 2 g by mouth daily. 4 per day 1062m per day  . glimepiride (AMARYL) 4 MG tablet TAKE 1 TABLET BY MOUTH  DAILY BEFORE BREAKFAST  . glucose blood (ACCU-CHEK AVIVA PLUS) test strip Test 1x a day- dx 250.02  . Melatonin 1 MG TABS Take by mouth. At bedtime   . metFORMIN (GLUCOPHAGE) 1000 MG tablet TAKE 1 TABLET BY MOUTH TWO  TIMES DAILY WITH MEALS  . Multiple Vitamin (MULTIVITAMIN) capsule Take 1 capsule by mouth daily.    . rosuvastatin (CRESTOR) 20 MG tablet Take 1 tablet (20 mg total) by mouth daily.    1. Essential hypertension  No c/o chest pain, SOB or headache. Does not check blood pressures at home BP Readings from Last 3 Encounters:  09/09/16 138/77  05/07/16 139/77  01/09/16 (!) 142/84     2. Type 2 diabetes mellitus without complication, without long-term current use of insulin (HCC) last HGBA1C was 6.8%. Blood sugars have been running 110-130 fasting. No hypoglycemic episodes.  3. Mixed hyperlipidemia  Very consciences of his diet- walks a lot playing golf  4. BMI 28.0-28.9,adult  No recent eight chnages    New complaints: Left hip pain- wore with sitting- standing and walking help relieve pain.  Social history: Is retired- lives with wife- helps keep grandchildren- plays golf 2-3 x a week and walks instead of riding a cart    Review of  Systems  Constitutional: Negative for activity change and appetite change.  HENT: Negative.   Eyes: Negative for pain.  Respiratory: Negative for shortness of breath.   Cardiovascular: Negative for chest pain, palpitations and leg swelling.  Gastrointestinal: Negative for abdominal pain.  Endocrine: Negative for polydipsia.  Genitourinary: Negative.   Skin: Negative for rash.  Neurological: Negative for dizziness, weakness and headaches.  Hematological: Does not bruise/bleed easily.  Psychiatric/Behavioral: Negative.   All other systems reviewed and are negative.      Objective:   Physical Exam  Constitutional: He is oriented to person, place, and time. He appears well-developed and well-nourished.  HENT:  Head: Normocephalic.  Right Ear: External ear normal.  Left Ear: External ear normal.  Nose: Nose normal.  Mouth/Throat: Oropharynx is clear and moist.  Eyes: EOM are normal. Pupils are equal, round, and reactive to light.  Neck: Normal range of motion. Neck supple. No JVD present. No thyromegaly present.  Cardiovascular: Normal rate, regular rhythm, normal heart sounds and intact distal pulses. Exam reveals no gallop and no friction rub.  No murmur heard. Pulmonary/Chest: Effort normal and breath sounds normal. No respiratory distress. He has no wheezes. He has no rales. He exhibits no tenderness.  Abdominal: Soft. Bowel sounds are normal. He exhibits no mass. There is no tenderness.  Genitourinary: Prostate normal and  penis normal.  Musculoskeletal: Normal range of motion. He exhibits no edema.  Pain on left buttocks on palpation FROM of left hip without pain  Lymphadenopathy:    He has no cervical adenopathy.  Neurological: He is alert and oriented to person, place, and time. No cranial nerve deficit.  Skin: Skin is warm and dry.  Psychiatric: He has a normal mood and affect. His behavior is normal. Judgment and thought content normal.   BP 131/77   Pulse (!) 59    Temp (!) 97.4 F (36.3 C) (Oral)   Ht '5\' 7"'  (1.702 m)   Wt 170 lb (77.1 kg)   BMI 26.63 kg/m   hgba1c 6.9%     Assessment & Plan:  1. Essential hypertension Low sodium diet - CMP14+EGFR - amLODipine-benazepril (LOTREL) 5-20 MG capsule; Take 1 capsule by mouth daily.  Dispense: 90 capsule; Refill: 1  2. Type 2 diabetes mellitus without complication, without long-term current use of insulin (HCC) Low carb diet - Bayer DCA Hb A1c Waived - Lipid panel - metFORMIN (GLUCOPHAGE) 1000 MG tablet; TAKE 1 TABLET BY MOUTH TWO  TIMES DAILY WITH MEALS  Dispense: 180 tablet; Refill: 1 - glimepiride (AMARYL) 4 MG tablet; TAKE 1 TABLET BY MOUTH  DAILY BEFORE BREAKFAST  Dispense: 90 tablet; Refill: 1  3. Mixed hyperlipidemia Low fat diet - rosuvastatin (CRESTOR) 20 MG tablet; Take 1 tablet (20 mg total) by mouth daily.  Dispense: 90 tablet; Refill: 1 - ezetimibe (ZETIA) 10 MG tablet; Take 1 tablet (10 mg total) by mouth daily.  Dispense: 90 tablet; Refill: 1  4. BMI 28.0-28.9,adult Discussed diet and exercise for person with BMI >25 Will recheck weight in 3-6 months  5. sciatica Motrin OTC every 6 hours for 1 week- take with food   Labs pending Health maintenance reviewed Diet and exercise encouraged Continue all meds Follow up  In 3 months   [place], FNP

## 2017-01-09 NOTE — Patient Instructions (Signed)
Hip Pain The hip is the joint between the upper legs and the lower pelvis. The bones, cartilage, tendons, and muscles of your hip joint support your body and allow you to move around. Hip pain can range from a minor ache to severe pain in one or both of your hips. The pain may be felt on the inside of the hip joint near the groin, or the outside near the buttocks and upper thigh. You may also have swelling or stiffness. Follow these instructions at home: Managing pain, stiffness, and swelling   If directed, apply ice to the injured area.  Put ice in a plastic bag.  Place a towel between your skin and the bag.  Leave the ice on for 20 minutes, 2-3 times a day  Sleep with a pillow between your legs on your most comfortable side.  Avoid any activities that cause pain. General instructions   Take over-the-counter and prescription medicines only as told by your health care provider.  Do any exercises as told by your health care provider.  Record the following:  How often you have hip pain.  The location of your pain.  What the pain feels like.  What makes the pain worse.  Keep all follow-up visits as told by your health care provider. This is important. Contact a health care provider if:  You cannot put weight on your leg.  Your pain or swelling continues or gets worse after one week.  It gets harder to walk.  You have a fever. Get help right away if:  You fall.  You have a sudden increase in pain and swelling in your hip.  Your hip is red or swollen or very tender to touch. Summary  Hip pain can range from a minor ache to severe pain in one or both of your hips.  The pain may be felt on the inside of the hip joint near the groin, or the outside near the buttocks and upper thigh.  Avoid any activities that cause pain.  Record how often you have hip pain, the location of the pain, what makes it worse and what it feels like. This information is not intended to  replace advice given to you by your health care provider. Make sure you discuss any questions you have with your health care provider. Document Released: 07/11/2009 Document Revised: 12/25/2015 Document Reviewed: 12/25/2015 Elsevier Interactive Patient Education  2017 [employer]  

## 2017-01-10 LAB — CMP14+EGFR
ALBUMIN: 4.3 g/dL (ref 3.6–4.8)
ALT: 31 IU/L (ref 0–44)
AST: 38 IU/L (ref 0–40)
Albumin/Globulin Ratio: 2 (ref 1.2–2.2)
Alkaline Phosphatase: 60 IU/L (ref 39–117)
BUN / CREAT RATIO: 11 (ref 10–24)
BUN: 10 mg/dL (ref 8–27)
Bilirubin Total: 0.5 mg/dL (ref 0.0–1.2)
CALCIUM: 9.2 mg/dL (ref 8.6–10.2)
CO2: 25 mmol/L (ref 20–29)
CREATININE: 0.89 mg/dL (ref 0.76–1.27)
Chloride: 102 mmol/L (ref 96–106)
GFR calc non Af Amer: 90 mL/min/{1.73_m2} (ref >59)
GFR, EST AFRICAN AMERICAN: 104 mL/min/{1.73_m2} (ref >59)
GLUCOSE: 118 mg/dL — AB (ref 65–99)
Globulin, Total: 2.2 g/dL (ref 1.5–4.5)
Potassium: 4.2 mmol/L (ref 3.5–5.2)
Sodium: 142 mmol/L (ref 134–144)
TOTAL PROTEIN: 6.5 g/dL (ref 6.0–8.5)

## 2017-01-10 LAB — LIPID PANEL
Chol/HDL Ratio: 2.6 ratio (ref 0.0–5.0)
Cholesterol, Total: 113 mg/dL (ref 100–199)
HDL: 44 mg/dL (ref >39)
LDL CALC: 58 mg/dL (ref 0–99)
Triglycerides: 56 mg/dL (ref 0–149)
VLDL CHOLESTEROL CAL: 11 mg/dL (ref 5–40)

## 2017-04-01 LAB — HM DIABETES EYE EXAM

## 2017-05-06 ENCOUNTER — Encounter: Payer: Self-pay | Admitting: Nurse Practitioner

## 2017-05-06 ENCOUNTER — Ambulatory Visit: Payer: Medicare Other | Admitting: Nurse Practitioner

## 2017-05-06 VITALS — BP 139/79 | HR 59 | Temp 97.5°F | Ht 67.0 in | Wt 168.0 lb

## 2017-05-06 DIAGNOSIS — Z1212 Encounter for screening for malignant neoplasm of rectum: Secondary | ICD-10-CM | POA: Diagnosis not present

## 2017-05-06 DIAGNOSIS — E782 Mixed hyperlipidemia: Secondary | ICD-10-CM | POA: Diagnosis not present

## 2017-05-06 DIAGNOSIS — E119 Type 2 diabetes mellitus without complications: Secondary | ICD-10-CM

## 2017-05-06 DIAGNOSIS — I10 Essential (primary) hypertension: Secondary | ICD-10-CM | POA: Diagnosis not present

## 2017-05-06 DIAGNOSIS — Z6826 Body mass index (BMI) 26.0-26.9, adult: Secondary | ICD-10-CM

## 2017-05-06 DIAGNOSIS — Z1211 Encounter for screening for malignant neoplasm of colon: Secondary | ICD-10-CM | POA: Diagnosis not present

## 2017-05-06 LAB — BAYER DCA HB A1C WAIVED: HB A1C: 7.1 % — AB (ref <7.0)

## 2017-05-06 MED ORDER — METFORMIN HCL 1000 MG PO TABS: ORAL_TABLET | ORAL | 1 refills | Status: DC

## 2017-05-06 MED ORDER — GLIMEPIRIDE 4 MG PO TABS: ORAL_TABLET | ORAL | 1 refills | Status: DC

## 2017-05-06 MED ORDER — EZETIMIBE 10 MG PO TABS: 10.0000 mg | ORAL_TABLET | Freq: Every day | ORAL | 1 refills | Status: DC

## 2017-05-06 MED ORDER — ROSUVASTATIN CALCIUM 20 MG PO TABS: 20.0000 mg | ORAL_TABLET | Freq: Every day | ORAL | 1 refills | Status: DC

## 2017-05-06 NOTE — Patient Instructions (Signed)

## 2017-05-06 NOTE — Addendum Note (Signed)
Addended by: Chevis Pretty on: 05/06/2017 09:46 AM   Modules accepted: Orders

## 2017-05-06 NOTE — Progress Notes (Signed)
Subjective:    Patient ID: Rodney Glover, male    DOB: Sep 14, 1950, 67 y.o.   MRN: [id number]  HPI   Rodney Glover is here today for follow up of chronic medical problem.  Outpatient Encounter Medications as of 05/06/2017  Medication Sig  . amLODipine-benazepril (LOTREL) 5-20 MG capsule Take 1 capsule by mouth daily.  Marland Kitchen aspirin 81 MG tablet Take 81 mg by mouth daily.    . Cholecalciferol (VITAMIN D-3) 5000 UNITS TABS Take 10,000 each by mouth daily.  . Cinnamon 500 MG TABS Take by mouth 2 (two) times daily.    Marland Kitchen ezetimibe (ZETIA) 10 MG tablet Take 1 tablet (10 mg total) by mouth daily.  . fish oil-omega-3 fatty acids 1000 MG capsule Take 2 g by mouth daily. 4 per day 1028m per day  . glimepiride (AMARYL) 4 MG tablet TAKE 1 TABLET BY MOUTH  DAILY BEFORE BREAKFAST  . glucose blood (ACCU-CHEK AVIVA PLUS) test strip Test 1x a day- dx 250.02  . Melatonin 1 MG TABS Take by mouth. At bedtime   . metFORMIN (GLUCOPHAGE) 1000 MG tablet TAKE 1 TABLET BY MOUTH TWO  TIMES DAILY WITH MEALS  . Multiple Vitamin (MULTIVITAMIN) capsule Take 1 capsule by mouth daily.    . rosuvastatin (CRESTOR) 20 MG tablet Take 1 tablet (20 mg total) by mouth daily.    1. Essential hypertension  No c/o chest pain, sob or headache. Does ot check blood pressures at home. BP Readings from Last 3 Encounters:  01/09/17 131/77  09/09/16 138/77  05/07/16 139/77     2. Type 2 diabetes mellitus without complication, unspecified whether long term insulin use (HCC) last HGBA1C was 6.9%. Fasting blood sugars have been running below 150. No hypoglycemia that he is aware of. He did say that blood sugars were a little high  ( above 150 ) for about 2 weeks when it was raining so much and he was not able to get out and walk or play golf.  3. Mixed hyperlipidemia  Watches diet. Walks 18 holes of golf at least 3 times a week.  4. BMI 26.0-26.9,adult  No recent weight changes    New complaints: None today  Social  history: Retired. Lives with wife of may years. Helps keep his granddaughter. Plays golf with group of men at least 3 times a week,  Weather permitting. Son in law owns his own business and he helps him occasionally.   Review of Systems  Constitutional: Negative for activity change and appetite change.  HENT: Negative.   Eyes: Negative for pain.  Respiratory: Negative for shortness of breath.   Cardiovascular: Negative for chest pain, palpitations and leg swelling.  Gastrointestinal: Negative for abdominal pain.  Endocrine: Negative for polydipsia.  Genitourinary: Negative.   Skin: Negative for rash.  Neurological: Negative for dizziness, weakness and headaches.  Hematological: Does not bruise/bleed easily.  Psychiatric/Behavioral: Negative.   All other systems reviewed and are negative.      Objective:   Physical Exam  Constitutional: He is oriented to person, place, and time. He appears well-developed and well-nourished.  HENT:  Head: Normocephalic.  Right Ear: External ear normal.  Left Ear: External ear normal.  Nose: Nose normal.  Mouth/Throat: Oropharynx is clear and moist.  Eyes: Pupils are equal, round, and reactive to light. EOM are normal.  Neck: Normal range of motion. Neck supple. No JVD present. No thyromegaly present.  Cardiovascular: Normal rate, regular rhythm, normal heart sounds and intact distal pulses.  Exam reveals no gallop and no friction rub.  No murmur heard. Pulmonary/Chest: Effort normal and breath sounds normal. No respiratory distress. He has no wheezes. He has no rales. He exhibits no tenderness.  Abdominal: Soft. Bowel sounds are normal. He exhibits no mass. There is no tenderness.  Genitourinary: Prostate normal and penis normal.  Musculoskeletal: Normal range of motion. He exhibits no edema.  Lymphadenopathy:    He has no cervical adenopathy.  Neurological: He is alert and oriented to person, place, and time. No cranial nerve deficit.  Skin:  Skin is warm and dry.  Psychiatric: He has a normal mood and affect. His behavior is normal. Judgment and thought content normal.   BP 139/79   Pulse (!) 59   Temp (!) 97.5 F (36.4 C) (Oral)   Ht _0  (1.702 m)   Wt 168 lb (76.2 kg)   BMI 26.31 kg/m   HGBA1C 7.1%    Assessment & Plan:  1. Essential hypertension Low sodium diet - CMP14+EGFR  2. Type 2 diabetes mellitus without complication, without long-term current use of insulin (HCC) - metFORMIN (GLUCOPHAGE) 1000 MG tablet; TAKE 1 TABLET BY MOUTH TWO  TIMES DAILY WITH MEALS  Dispense: 180 tablet; Refill: 1 - glimepiride (AMARYL) 4 MG tablet; TAKE 1 TABLET BY MOUTH  DAILY BEFORE BREAKFAST  Dispense: 90 tablet; Refill: 1 continue to watch carbs in diet Continue exercise - Bayer DCA Hb A1c Waived  3. Mixed hyperlipidemia Low fat diet - Lipid panel - rosuvastatin (CRESTOR) 20 MG tablet; Take 1 tablet (20 mg total) by mouth daily.  Dispense: 90 tablet; Refill: 1 - ezetimibe (ZETIA) 10 MG tablet; Take 1 tablet (10 mg total) by mouth daily.  Dispense: 90 tablet; Refill: 1  4. BMI 26.0-26.9,adult Discussed diet and exercise for person with BMI >25 Will recheck weight in 3-6 months   Labs pending Health maintenance reviewed Diet and exercise encouraged Continue all meds Follow up  In 3 months   [place], FNP

## 2017-05-07 LAB — CMP14+EGFR
ALBUMIN: 4.4 g/dL (ref 3.6–4.8)
ALT: 29 IU/L (ref 0–44)
AST: 31 IU/L (ref 0–40)
Albumin/Globulin Ratio: 1.8 (ref 1.2–2.2)
Alkaline Phosphatase: 69 IU/L (ref 39–117)
BILIRUBIN TOTAL: 0.2 mg/dL (ref 0.0–1.2)
BUN / CREAT RATIO: 11 (ref 10–24)
BUN: 9 mg/dL (ref 8–27)
CALCIUM: 9.1 mg/dL (ref 8.6–10.2)
CO2: 21 mmol/L (ref 20–29)
Chloride: 99 mmol/L (ref 96–106)
Creatinine, Ser: 0.79 mg/dL (ref 0.76–1.27)
GFR calc non Af Amer: 94 mL/min/{1.73_m2} (ref >59)
GFR, EST AFRICAN AMERICAN: 108 mL/min/{1.73_m2} (ref >59)
GLUCOSE: 142 mg/dL — AB (ref 65–99)
Globulin, Total: 2.5 g/dL (ref 1.5–4.5)
Potassium: 4.6 mmol/L (ref 3.5–5.2)
Sodium: 139 mmol/L (ref 134–144)
TOTAL PROTEIN: 6.9 g/dL (ref 6.0–8.5)

## 2017-05-07 LAB — LIPID PANEL
Chol/HDL Ratio: 3.1 ratio (ref 0.0–5.0)
Cholesterol, Total: 124 mg/dL (ref 100–199)
HDL: 40 mg/dL (ref >39)
LDL CALC: 59 mg/dL (ref 0–99)
Triglycerides: 124 mg/dL (ref 0–149)
VLDL CHOLESTEROL CAL: 25 mg/dL (ref 5–40)

## 2017-05-28 LAB — COLOGUARD
COLOGUARD: NEGATIVE
Cologuard: NEGATIVE

## 2017-06-04 ENCOUNTER — Encounter: Payer: Self-pay | Admitting: Family Medicine

## 2017-06-04 ENCOUNTER — Ambulatory Visit: Payer: Medicare Other | Admitting: Family Medicine

## 2017-06-04 VITALS — BP 129/76 | HR 77 | Temp 98.0°F | Ht 67.0 in | Wt 167.4 lb

## 2017-06-04 DIAGNOSIS — J40 Bronchitis, not specified as acute or chronic: Secondary | ICD-10-CM | POA: Diagnosis not present

## 2017-06-04 DIAGNOSIS — J329 Chronic sinusitis, unspecified: Secondary | ICD-10-CM

## 2017-06-04 MED ORDER — BETAMETHASONE SOD PHOS & ACET 6 (3-3) MG/ML IJ SUSP
6.0000 mg | Freq: Once | INTRAMUSCULAR | Status: AC
  Administered 2017-06-04: 6 mg via INTRAMUSCULAR

## 2017-06-04 MED ORDER — PSEUDOEPHEDRINE-GUAIFENESIN ER 120-1200 MG PO TB12: ORAL_TABLET | ORAL | 0 refills | Status: DC

## 2017-06-04 MED ORDER — LEVOFLOXACIN 500 MG PO TABS: 500.0000 mg | ORAL_TABLET | Freq: Every day | ORAL | 0 refills | Status: DC

## 2017-06-04 NOTE — Progress Notes (Signed)
Chief Complaint  Patient presents with  . Cough    pt here today c/o cough and congestion which he thinks is sinus related    HPI  Patient presents today for Patient presents with upper respiratory congestion. Rhinorrhea that is frequently purulent. There is moderate sore throat. Patient reports coughing frequently as well.  green sputum noted. There is no fever, chills or sweats. The patient denies being short of breath. Onset was 3-5 days ago. Gradually worsening. Tried OTCs without improvement.  PMH: Smoking status noted ROS: Per HPI  Objective: BP 129/76   Pulse 77   Temp 98 F (36.7 C) (Oral)   Ht 5\' 7"  (1.702 m)   Wt 167 lb 6 oz (75.9 kg)   BMI 26.21 kg/m  Gen: NAD, alert, cooperative with exam HEENT: NCAT, Nasal passages swollen, red TMS clear. Max sinuses tender CV: RRR, good S1/S2, no murmur Resp: Bronchitis changes at right base with scattered wheezes, non-labored Ext: No edema, warm Neuro: Alert and oriented, No gross deficits  Assessment and plan:  1. Sinusitis, unspecified chronicity, unspecified location   2. Sinobronchitis     Meds ordered this encounter  Medications  . betamethasone acetate-betamethasone sodium phosphate (CELESTONE) injection 6 mg  . levofloxacin (LEVAQUIN) 500 MG tablet    Sig: Take 1 tablet (500 mg total) by mouth daily.    Dispense:  10 tablet    Refill:  0  . Pseudoephedrine-Guaifenesin (MUCINEX D MAX STRENGTH) [phone] MG TB12    Sig: Take 1 by mouth twice daily as needed for congestion.    Dispense:  12 each    Refill:  0    No orders of the defined types were placed in this encounter.   Follow up as needed.  Claretta Fraise, MD

## 2017-06-04 NOTE — Patient Instructions (Signed)
Allegra 180 OTC for allergy symptoms

## 2017-07-11 ENCOUNTER — Other Ambulatory Visit: Payer: Self-pay | Admitting: Nurse Practitioner

## 2017-07-11 DIAGNOSIS — I10 Essential (primary) hypertension: Secondary | ICD-10-CM

## 2017-09-04 ENCOUNTER — Encounter: Payer: Self-pay | Admitting: Nurse Practitioner

## 2017-09-04 ENCOUNTER — Ambulatory Visit: Payer: Medicare Other | Admitting: Nurse Practitioner

## 2017-09-04 VITALS — BP 140/75 | HR 60 | Temp 97.5°F | Ht 67.0 in | Wt 164.0 lb

## 2017-09-04 DIAGNOSIS — E782 Mixed hyperlipidemia: Secondary | ICD-10-CM | POA: Diagnosis not present

## 2017-09-04 DIAGNOSIS — I10 Essential (primary) hypertension: Secondary | ICD-10-CM

## 2017-09-04 DIAGNOSIS — Z6828 Body mass index (BMI) 28.0-28.9, adult: Secondary | ICD-10-CM | POA: Diagnosis not present

## 2017-09-04 DIAGNOSIS — Z125 Encounter for screening for malignant neoplasm of prostate: Secondary | ICD-10-CM

## 2017-09-04 DIAGNOSIS — E119 Type 2 diabetes mellitus without complications: Secondary | ICD-10-CM

## 2017-09-04 LAB — BAYER DCA HB A1C WAIVED: HB A1C (BAYER DCA - WAIVED): 6.9 % (ref <7.0)

## 2017-09-04 MED ORDER — AMLODIPINE BESY-BENAZEPRIL HCL 5-20 MG PO CAPS: 1.0000 | ORAL_CAPSULE | Freq: Every day | ORAL | 1 refills | Status: DC

## 2017-09-04 MED ORDER — ROSUVASTATIN CALCIUM 20 MG PO TABS: 20.0000 mg | ORAL_TABLET | Freq: Every day | ORAL | 1 refills | Status: DC

## 2017-09-04 MED ORDER — BLOOD GLUCOSE METER KIT: PACK | 0 refills | Status: DC

## 2017-09-04 MED ORDER — METFORMIN HCL 1000 MG PO TABS: ORAL_TABLET | ORAL | 1 refills | Status: DC

## 2017-09-04 MED ORDER — EZETIMIBE 10 MG PO TABS: 10.0000 mg | ORAL_TABLET | Freq: Every day | ORAL | 1 refills | Status: DC

## 2017-09-04 MED ORDER — GLIMEPIRIDE 4 MG PO TABS
ORAL_TABLET | ORAL | 1 refills | Status: DC
Start: 2017-09-04 — End: 2018-01-07

## 2017-09-04 NOTE — Patient Instructions (Signed)

## 2017-09-04 NOTE — Progress Notes (Signed)
Subjective:    Patient ID: Rodney Glover, male    DOB: Mar 31, 1950, 67 y.o.   MRN: [id number]   Chief Complaint: Medical management of chronic issues  HPI:  1. Essential hypertension  No c/o chest pain, sob or headache. Does not check blood pressure at home. BP Readings from Last 3 Encounters:  06/04/17 129/76  05/06/17 139/79  01/09/17 131/77     2. Type 2 diabetes mellitus without complication, unspecified whether long term insulin use ([facility]) last hgba1c ws 7.1%. His blood sugars are always below 120. He plays golf 3x a week and walks golf course rather then riding golf cart. He tries very hard  to watch his diet.  3. Mixed hyperlipidemia  Avoids most fried foods.  4. BMI 28.0-28.9,adult  No recent weight changes  5. Screening for prostate cancer  Last PSA was .4    Outpatient Encounter Medications as of 09/04/2017  Medication Sig  . amLODipine-benazepril (LOTREL) 5-20 MG capsule TAKE 1 CAPSULE BY MOUTH  DAILY  . aspirin 81 MG tablet Take 81 mg by mouth daily.    . Cholecalciferol (VITAMIN D-3) 5000 UNITS TABS Take 10,000 each by mouth daily.  . Cinnamon 500 MG TABS Take by mouth 2 (two) times daily.    Marland Kitchen ezetimibe (ZETIA) 10 MG tablet Take 1 tablet (10 mg total) by mouth daily.  . fish oil-omega-3 fatty acids 1000 MG capsule Take 2 g by mouth daily. 4 per day 1076m per day  . glimepiride (AMARYL) 4 MG tablet TAKE 1 TABLET BY MOUTH  DAILY BEFORE BREAKFAST  . glucose blood (ACCU-CHEK AVIVA PLUS) test strip Test 1x a day- dx 250.02  . levofloxacin (LEVAQUIN) 500 MG tablet Take 1 tablet (500 mg total) by mouth daily.  . Melatonin 1 MG TABS Take by mouth. At bedtime   . metFORMIN (GLUCOPHAGE) 1000 MG tablet TAKE 1 TABLET BY MOUTH TWO  TIMES DAILY WITH MEALS  . Multiple Vitamin (MULTIVITAMIN) capsule Take 1 capsule by mouth daily.    . Pseudoephedrine-Guaifenesin (MUCINEX D MAX STRENGTH) [phone] MG TB12 Take 1 by mouth twice daily as needed for congestion.  . rosuvastatin  (CRESTOR) 20 MG tablet Take 1 tablet (20 mg total) by mouth daily.      New complaints: None today  Social history: Retired - wife and him keep their grandchildren often.    Review of Systems  Constitutional: Negative for activity change and appetite change.  HENT: Negative.   Eyes: Negative for pain.  Respiratory: Negative for shortness of breath.   Cardiovascular: Negative for chest pain, palpitations and leg swelling.  Gastrointestinal: Negative for abdominal pain.  Endocrine: Negative for polydipsia.  Genitourinary: Negative.   Skin: Negative for rash.  Neurological: Negative for dizziness, weakness and headaches.  Hematological: Does not bruise/bleed easily.  Psychiatric/Behavioral: Negative.   All other systems reviewed and are negative.      Objective:   Physical Exam  Constitutional: He is oriented to person, place, and time. He appears well-developed and well-nourished.  HENT:  Head: Normocephalic.  Nose: Nose normal.  Mouth/Throat: Oropharynx is clear and moist.  Eyes: Pupils are equal, round, and reactive to light. EOM are normal.  Neck: Normal range of motion and phonation normal. Neck supple. No JVD present. Carotid bruit is not present. No thyroid mass and no thyromegaly present.  Cardiovascular: Normal rate and regular rhythm.  Pulmonary/Chest: Effort normal and breath sounds normal. No respiratory distress.  Abdominal: Soft. Normal appearance, normal aorta and bowel sounds  are normal. There is no tenderness.  Musculoskeletal: Normal range of motion.  Lymphadenopathy:    He has no cervical adenopathy.  Neurological: He is alert and oriented to person, place, and time.  Skin: Skin is warm and dry.  Psychiatric: He has a normal mood and affect. His behavior is normal. Judgment and thought content normal.    BP 140/75   Pulse 60   Temp (!) 97.5 F (36.4 C) (Oral)   Ht '5\' 7"'  (1.702 m)   Wt 164 lb (74.4 kg)   BMI 25.69 kg/m    hgba1c 6.8%        Assessment & Plan:  MAXAMILIAN AMADON comes in today with chief complaint of Medical Management of Chronic Issues   Diagnosis and orders addressed:  1. Essential hypertension Low sodium diet - CMP14+EGFR - amLODipine-benazepril (LOTREL) 5-20 MG capsule; Take 1 capsule by mouth daily.  Dispense: 90 capsule; Refill: 1  2.Type 2 diabetes mellitus without complication, without long-term current use of insulin (HCC) Continue t owatch carbs in diet - metFORMIN (GLUCOPHAGE) 1000 MG tablet; TAKE 1 TABLET BY MOUTH TWO  TIMES DAILY WITH MEALS  Dispense: 180 tablet; Refill: 1 - glimepiride (AMARYL) 4 MG tablet; TAKE 1 TABLET BY MOUTH  DAILY BEFORE BREAKFAST  Dispense: 90 tablet; Refill: 1  - Bayer DCA Hb A1c Waived - Microalbumin / creatinine urine ratio - blood glucose meter kit and supplies; Dispense based on patient and insurance preference. Use up to four times daily as directed. (FOR ICD-10 E10.9, E11.9).  Dispense: 1 each; Refill: 0  3. Mixed hyperlipidemia Low fat diet - Lipid panel - rosuvastatin (CRESTOR) 20 MG tablet; Take 1 tablet (20 mg total) by mouth daily.  Dispense: 90 tablet; Refill: 1 - ezetimibe (ZETIA) 10 MG tablet; Take 1 tablet (10 mg total) by mouth daily.  Dispense: 90 tablet; Refill: 1  4. BMI 28.0-28.9,adult Discussed diet and exercise for person with BMI >25 Will recheck weight in 3-6 months  5. Screening for prostate cancer - PSA, total and free  Labs pending Health Maintenance reviewed Diet and exercise encouraged  Follow up plan: 3 months   [place], FNP

## 2017-09-05 LAB — CMP14+EGFR
ALK PHOS: 66 IU/L (ref 39–117)
ALT: 39 IU/L (ref 0–44)
AST: 42 IU/L — AB (ref 0–40)
Albumin/Globulin Ratio: 1.9 (ref 1.2–2.2)
Albumin: 4.5 g/dL (ref 3.6–4.8)
BUN/Creatinine Ratio: 8 — ABNORMAL LOW (ref 10–24)
BUN: 8 mg/dL (ref 8–27)
Bilirubin Total: 0.5 mg/dL (ref 0.0–1.2)
CHLORIDE: 101 mmol/L (ref 96–106)
CO2: 24 mmol/L (ref 20–29)
Calcium: 9.3 mg/dL (ref 8.6–10.2)
Creatinine, Ser: 0.98 mg/dL (ref 0.76–1.27)
GFR calc Af Amer: 92 mL/min/{1.73_m2} (ref >59)
GFR calc non Af Amer: 80 mL/min/{1.73_m2} (ref >59)
GLOBULIN, TOTAL: 2.4 g/dL (ref 1.5–4.5)
Glucose: 149 mg/dL — ABNORMAL HIGH (ref 65–99)
Potassium: 5.1 mmol/L (ref 3.5–5.2)
SODIUM: 142 mmol/L (ref 134–144)
Total Protein: 6.9 g/dL (ref 6.0–8.5)

## 2017-09-05 LAB — LIPID PANEL
CHOLESTEROL TOTAL: 119 mg/dL (ref 100–199)
Chol/HDL Ratio: 2.4 ratio (ref 0.0–5.0)
HDL: 49 mg/dL (ref >39)
LDL Calculated: 60 mg/dL (ref 0–99)
TRIGLYCERIDES: 51 mg/dL (ref 0–149)
VLDL Cholesterol Cal: 10 mg/dL (ref 5–40)

## 2017-09-05 LAB — PSA, TOTAL AND FREE
PROSTATE SPECIFIC AG, SERUM: 0.4 ng/mL (ref 0.0–4.0)
PSA FREE: 0.09 ng/mL
PSA, Free Pct: 22.5 %

## 2017-10-21 ENCOUNTER — Other Ambulatory Visit: Payer: Self-pay | Admitting: *Deleted

## 2017-10-21 MED ORDER — ACCU-CHEK AVIVA PLUS W/DEVICE KIT: PACK | 0 refills | Status: AC

## 2017-12-11 ENCOUNTER — Encounter: Payer: Self-pay | Admitting: *Deleted

## 2018-01-07 ENCOUNTER — Ambulatory Visit: Payer: Medicare Other | Admitting: Nurse Practitioner

## 2018-01-07 ENCOUNTER — Encounter: Payer: Self-pay | Admitting: Nurse Practitioner

## 2018-01-07 VITALS — BP 138/70 | HR 58 | Temp 97.2°F | Ht 67.0 in | Wt 168.0 lb

## 2018-01-07 DIAGNOSIS — Z6828 Body mass index (BMI) 28.0-28.9, adult: Secondary | ICD-10-CM | POA: Diagnosis not present

## 2018-01-07 DIAGNOSIS — E119 Type 2 diabetes mellitus without complications: Secondary | ICD-10-CM | POA: Diagnosis not present

## 2018-01-07 DIAGNOSIS — I10 Essential (primary) hypertension: Secondary | ICD-10-CM | POA: Diagnosis not present

## 2018-01-07 DIAGNOSIS — E782 Mixed hyperlipidemia: Secondary | ICD-10-CM

## 2018-01-07 LAB — CMP14+EGFR
ALBUMIN: 4.5 g/dL (ref 3.6–4.8)
ALK PHOS: 64 IU/L (ref 39–117)
ALT: 26 IU/L (ref 0–44)
AST: 31 IU/L (ref 0–40)
Albumin/Globulin Ratio: 2.3 — ABNORMAL HIGH (ref 1.2–2.2)
BILIRUBIN TOTAL: 0.5 mg/dL (ref 0.0–1.2)
BUN / CREAT RATIO: 15 (ref 10–24)
BUN: 13 mg/dL (ref 8–27)
CHLORIDE: 98 mmol/L (ref 96–106)
CO2: 23 mmol/L (ref 20–29)
Calcium: 9 mg/dL (ref 8.6–10.2)
Creatinine, Ser: 0.85 mg/dL (ref 0.76–1.27)
GFR calc Af Amer: 105 mL/min/{1.73_m2} (ref >59)
GFR calc non Af Amer: 91 mL/min/{1.73_m2} (ref >59)
GLOBULIN, TOTAL: 2 g/dL (ref 1.5–4.5)
GLUCOSE: 153 mg/dL — AB (ref 65–99)
Potassium: 4.5 mmol/L (ref 3.5–5.2)
SODIUM: 137 mmol/L (ref 134–144)
TOTAL PROTEIN: 6.5 g/dL (ref 6.0–8.5)

## 2018-01-07 LAB — LIPID PANEL
CHOLESTEROL TOTAL: 117 mg/dL (ref 100–199)
Chol/HDL Ratio: 2.5 ratio (ref 0.0–5.0)
HDL: 46 mg/dL (ref >39)
LDL CALC: 61 mg/dL (ref 0–99)
Triglycerides: 52 mg/dL (ref 0–149)
VLDL Cholesterol Cal: 10 mg/dL (ref 5–40)

## 2018-01-07 LAB — BAYER DCA HB A1C WAIVED: HB A1C (BAYER DCA - WAIVED): 7.4 % — ABNORMAL HIGH (ref <7.0)

## 2018-01-07 MED ORDER — ROSUVASTATIN CALCIUM 20 MG PO TABS: 20.0000 mg | ORAL_TABLET | Freq: Every day | ORAL | 1 refills | Status: DC

## 2018-01-07 MED ORDER — EZETIMIBE 10 MG PO TABS: 10.0000 mg | ORAL_TABLET | Freq: Every day | ORAL | 1 refills | Status: DC

## 2018-01-07 MED ORDER — GLIMEPIRIDE 4 MG PO TABS: ORAL_TABLET | ORAL | 1 refills | Status: DC

## 2018-01-07 MED ORDER — AMLODIPINE BESY-BENAZEPRIL HCL 5-20 MG PO CAPS: 1.0000 | ORAL_CAPSULE | Freq: Every day | ORAL | 1 refills | Status: DC

## 2018-01-07 MED ORDER — METFORMIN HCL 1000 MG PO TABS: ORAL_TABLET | ORAL | 1 refills | Status: DC

## 2018-01-07 MED ORDER — GLUCOSE BLOOD VI STRP: ORAL_STRIP | 11 refills | Status: DC

## 2018-01-07 NOTE — Patient Instructions (Signed)
Diabetes Mellitus and Nutrition When you have diabetes (diabetes mellitus), it is very important to have healthy eating habits because your blood sugar (glucose) levels are greatly affected by what you eat and drink. Eating healthy foods in the appropriate amounts, at about the same times every day, can help you:  Control your blood glucose.  Lower your risk of heart disease.  Improve your blood pressure.  Reach or maintain a healthy weight.  Every person with diabetes is different, and each person has different needs for a meal plan. Your health care provider may recommend that you work with a diet and nutrition specialist (dietitian) to make a meal plan that is best for you. Your meal plan may vary depending on factors such as:  The calories you need.  The medicines you take.  Your weight.  Your blood glucose, blood pressure, and cholesterol levels.  Your activity level.  Other health conditions you have, such as heart or kidney disease.  How do carbohydrates affect me? Carbohydrates affect your blood glucose level more than any other type of food. Eating carbohydrates naturally increases the amount of glucose in your blood. Carbohydrate counting is a method for keeping track of how many carbohydrates you eat. Counting carbohydrates is important to keep your blood glucose at a healthy level, especially if you use insulin or take certain oral diabetes medicines. It is important to know how many carbohydrates you can safely have in each meal. This is different for every person. Your dietitian can help you calculate how many carbohydrates you should have at each meal and for snack. Foods that contain carbohydrates include:  Bread, cereal, rice, pasta, and crackers.  Potatoes and corn.  Peas, beans, and lentils.  Milk and yogurt.  Fruit and juice.  Desserts, such as cakes, cookies, ice cream, and candy.  How does alcohol affect me? Alcohol can cause a sudden decrease in blood  glucose (hypoglycemia), especially if you use insulin or take certain oral diabetes medicines. Hypoglycemia can be a life-threatening condition. Symptoms of hypoglycemia (sleepiness, dizziness, and confusion) are similar to symptoms of having too much alcohol. If your health care provider says that alcohol is safe for you, follow these guidelines:  Limit alcohol intake to no more than 1 drink per day for nonpregnant women and 2 drinks per day for men. One drink equals 12 oz of beer, 5 oz of wine, or 1 oz of hard liquor.  Do not drink on an empty stomach.  Keep yourself hydrated with water, diet soda, or unsweetened iced tea.  Keep in mind that regular soda, juice, and other mixers may contain a lot of sugar and must be counted as carbohydrates.  What are tips for following this plan? Reading food labels  Start by checking the serving size on the label. The amount of calories, carbohydrates, fats, and other nutrients listed on the label are based on one serving of the food. Many foods contain more than one serving per package.  Check the total grams (g) of carbohydrates in one serving. You can calculate the number of servings of carbohydrates in one serving by dividing the total carbohydrates by 15. For example, if a food has 30 g of total carbohydrates, it would be equal to 2 servings of carbohydrates.  Check the number of grams (g) of saturated and trans fats in one serving. Choose foods that have low or no amount of these fats.  Check the number of milligrams (mg) of sodium in one serving. Most people   should limit total sodium intake to less than 2,300 mg per day.  Always check the nutrition information of foods labeled as "low-fat" or "nonfat". These foods may be higher in added sugar or refined carbohydrates and should be avoided.  Talk to your dietitian to identify your daily goals for nutrients listed on the label. Shopping  Avoid buying canned, premade, or processed foods. These  foods tend to be high in fat, sodium, and added sugar.  Shop around the outside edge of the grocery store. This includes fresh fruits and vegetables, bulk grains, fresh meats, and fresh dairy. Cooking  Use low-heat cooking methods, such as baking, instead of high-heat cooking methods like deep frying.  Cook using healthy oils, such as olive, canola, or sunflower oil.  Avoid cooking with butter, cream, or high-fat meats. Meal planning  Eat meals and snacks regularly, preferably at the same times every day. Avoid going long periods of time without eating.  Eat foods high in fiber, such as fresh fruits, vegetables, beans, and whole grains. Talk to your dietitian about how many servings of carbohydrates you can eat at each meal.  Eat 4-6 ounces of lean protein each day, such as lean meat, chicken, fish, eggs, or tofu. 1 ounce is equal to 1 ounce of meat, chicken, or fish, 1 egg, or 1/4 cup of tofu.  Eat some foods each day that contain healthy fats, such as avocado, nuts, seeds, and fish. Lifestyle   Check your blood glucose regularly.  Exercise at least 30 minutes 5 or more days each week, or as told by your health care provider.  Take medicines as told by your health care provider.  Do not use any products that contain nicotine or tobacco, such as cigarettes and e-cigarettes. If you need help quitting, ask your health care provider.  Work with a counselor or diabetes educator to identify strategies to manage stress and any emotional and social challenges. What are some questions to ask my health care provider?  Do I need to meet with a diabetes educator?  Do I need to meet with a dietitian?  What number can I call if I have questions?  When are the best times to check my blood glucose? Where to find more information:  American Diabetes Association: diabetes.org/food-and-fitness/food  Academy of Nutrition and Dietetics:  www.eatright.org/resources/health/diseases-and-conditions/diabetes  National Institute of Diabetes and Digestive and Kidney Diseases (NIH): www.niddk.nih.gov/health-information/diabetes/overview/diet-eating-physical-activity Summary  A healthy meal plan will help you control your blood glucose and maintain a healthy lifestyle.  Working with a diet and nutrition specialist (dietitian) can help you make a meal plan that is best for you.  Keep in mind that carbohydrates and alcohol have immediate effects on your blood glucose levels. It is important to count carbohydrates and to use alcohol carefully. This information is not intended to replace advice given to you by your health care provider. Make sure you discuss any questions you have with your health care provider. Document Released: 10/18/2004 Document Revised: 02/26/2016 Document Reviewed: 02/26/2016 Elsevier Interactive Patient Education  2018 Elsevier Inc.  

## 2018-01-07 NOTE — Progress Notes (Signed)
Subjective:    Patient ID: Rodney Glover, male    DOB: [birth year], 67 y.o.   MRN: [id number]   Chief Complaint: Medical Management of Chronic Issues   HPI:  1. Type 2 diabetes mellitus without complication, without long-term current use of insulin ([place]) last hgba1c was 6.9%. His blood sugars fasting stays below 140. No c/o of hypogycemia  2. Essential hypertension  No c/o chest pain, sob or headache. Does not check blood pressure at home.  3. Mixed hyperlipidemia  Tries to watch diet. Plays cough 2 x a week and walks course.  4. BMI 28.0-28.9,adult  No recent weight changes    Outpatient Encounter Medications as of 01/07/2018  Medication Sig  . amLODipine-benazepril (LOTREL) 5-20 MG capsule Take 1 capsule by mouth daily.  Marland Kitchen aspirin 81 MG tablet Take 81 mg by mouth daily.    . blood glucose meter kit and supplies Dispense based on patient and insurance preference. Use up to four times daily as directed. (FOR ICD-10 E10.9, E11.9).  Marland Kitchen Blood Glucose Monitoring Suppl (ACCU-CHEK AVIVA PLUS) w/Device KIT Use to check blood sugar daily  . Cholecalciferol (VITAMIN D-3) 5000 UNITS TABS Take 10,000 each by mouth daily.  . Cinnamon 500 MG TABS Take by mouth 2 (two) times daily.    Marland Kitchen ezetimibe (ZETIA) 10 MG tablet Take 1 tablet (10 mg total) by mouth daily.  . fish oil-omega-3 fatty acids 1000 MG capsule Take 2 g by mouth daily. 4 per day 1066m per day  . glimepiride (AMARYL) 4 MG tablet TAKE 1 TABLET BY MOUTH  DAILY BEFORE BREAKFAST  . glucose blood (ACCU-CHEK AVIVA PLUS) test strip Test 1x a day- dx 250.02  . Melatonin 1 MG TABS Take by mouth. At bedtime   . metFORMIN (GLUCOPHAGE) 1000 MG tablet TAKE 1 TABLET BY MOUTH TWO  TIMES DAILY WITH MEALS  . Multiple Vitamin (MULTIVITAMIN) capsule Take 1 capsule by mouth daily.    . rosuvastatin (CRESTOR) 20 MG tablet Take 1 tablet (20 mg total) by mouth daily.       New complaints: None today  Social history: Retired - lives with wife-  keeps grand daughter alot   Review of Systems  Constitutional: Negative for activity change and appetite change.  HENT: Negative.   Eyes: Negative for pain.  Respiratory: Negative for shortness of breath.   Cardiovascular: Negative for chest pain, palpitations and leg swelling.  Gastrointestinal: Negative for abdominal pain.  Endocrine: Negative for polydipsia.  Genitourinary: Negative.   Skin: Negative for rash.  Neurological: Negative for dizziness, weakness and headaches.  Hematological: Does not bruise/bleed easily.  Psychiatric/Behavioral: Negative.   All other systems reviewed and are negative.      Objective:   Physical Exam  Constitutional: He is oriented to person, place, and time. He appears well-developed and well-nourished.  HENT:  Head: Normocephalic.  Nose: Nose normal.  Mouth/Throat: Oropharynx is clear and moist.  Eyes: Pupils are equal, round, and reactive to light. EOM are normal.  Neck: Normal range of motion and phonation normal. Neck supple. No JVD present. Carotid bruit is not present. No thyroid mass and no thyromegaly present.  Cardiovascular: Normal rate and regular rhythm.  Pulmonary/Chest: Effort normal and breath sounds normal. No respiratory distress.  Abdominal: Soft. Normal appearance, normal aorta and bowel sounds are normal. There is no tenderness.  Musculoskeletal: Normal range of motion.  Lymphadenopathy:    He has no cervical adenopathy.  Neurological: He is alert and oriented to person, place,  and time.  Skin: Skin is warm and dry.  Psychiatric: He has a normal mood and affect. His behavior is normal. Judgment and thought content normal.  Nursing note and vitals reviewed.   BP 138/70   Pulse (!) 58   Temp (!) 97.2 F (36.2 C) (Oral)   Ht 5' 7" (1.702 m)   Wt 168 lb (76.2 kg)   BMI 26.31 kg/m   HGBA1c 7.4% today      Assessment & Plan:  Rodney Glover comes in today with chief complaint of Medical Management of Chronic  Issues   Diagnosis and orders addressed:  1. Type 2 diabetes mellitus without complication, without long-term current use of insulin (HCC) Stricter carb caounting - Lipid panel - glucose blood (ACCU-CHEK AVIVA PLUS) test strip; Test 1x a day- dx 250.02  Dispense: 300 each; Refill: 11 - metFORMIN (GLUCOPHAGE) 1000 MG tablet; TAKE 1 TABLET BY MOUTH TWO  TIMES DAILY WITH MEALS  Dispense: 180 tablet; Refill: 1 - glimepiride (AMARYL) 4 MG tablet; TAKE 1 TABLET BY MOUTH  DAILY BEFORE BREAKFAST  Dispense: 90 tablet; Refill: 1  2. Essential hypertension Low sodium diet - CMP14+EGFR - amLODipine-benazepril (LOTREL) 5-20 MG capsule; Take 1 capsule by mouth daily.  Dispense: 90 capsule; Refill: 1  3. Mixed hyperlipidemia Low fat diet - Bayer DCA Hb A1c Waived - rosuvastatin (CRESTOR) 20 MG tablet; Take 1 tablet (20 mg total) by mouth daily.  Dispense: 90 tablet; Refill: 1 - ezetimibe (ZETIA) 10 MG tablet; Take 1 tablet (10 mg total) by mouth daily.  Dispense: 90 tablet; Refill: 1  4. BMI 28.0-28.9,adult Discussed diet and exercise for person with BMI >25 Will recheck weight in 3-6 months   Labs pending Health Maintenance reviewed Diet and exercise encouraged  Follow up plan: 3 months   Mary-Margaret Hassell Done, FNP

## 2018-01-08 ENCOUNTER — Ambulatory Visit: Payer: Medicare Other | Admitting: Nurse Practitioner

## 2018-02-14 ENCOUNTER — Ambulatory Visit: Payer: Medicare Other

## 2018-03-31 LAB — HM DIABETES EYE EXAM

## 2018-05-07 ENCOUNTER — Encounter: Payer: Self-pay | Admitting: Nurse Practitioner

## 2018-05-07 ENCOUNTER — Ambulatory Visit (INDEPENDENT_AMBULATORY_CARE_PROVIDER_SITE_OTHER): Payer: Medicare Other | Admitting: Nurse Practitioner

## 2018-05-07 ENCOUNTER — Other Ambulatory Visit: Payer: Self-pay

## 2018-05-07 DIAGNOSIS — Z6828 Body mass index (BMI) 28.0-28.9, adult: Secondary | ICD-10-CM

## 2018-05-07 DIAGNOSIS — I10 Essential (primary) hypertension: Secondary | ICD-10-CM | POA: Diagnosis not present

## 2018-05-07 DIAGNOSIS — E782 Mixed hyperlipidemia: Secondary | ICD-10-CM

## 2018-05-07 DIAGNOSIS — E119 Type 2 diabetes mellitus without complications: Secondary | ICD-10-CM | POA: Diagnosis not present

## 2018-05-07 NOTE — Progress Notes (Addendum)
Patient ID: Rodney Glover, male   DOB: May 25, 1950, 68 y.o.   MRN: [id number]    Virtual Visit via telephone Note  I connected with Rodney Glover on 05/07/18 at 8:05AM by telephone and verified that I am speaking with the correct person using two identifiers. Rodney Glover is currently located at home and wife is currently with her during visit. The provider, Mary-Margaret Hassell Done, FNP is located in their office at time of visit.  I discussed the limitations, risks, security and privacy concerns of performing an evaluation and management service by telephone and the availability of in person appointments. I also discussed with the patient that there may be a patient responsible charge related to this service. The patient expressed understanding and agreed to proceed.   History and Present Illness:   Chief Complaint: medical management of chronic issues  HPI:  1. Essential hypertension  No c/o chest pain   2. Type 2 diabetes mellitus without complication, unspecified whether long term insulin use ([place]) last hgba1c was 7.4%  He has been doing well, his blood sugars have been running around 120-135. He denies any low blood sugars.  3. Mixed hyperlipidemia  Watches diet but has not been as active as he was before this covid 19 hit.  4. BMI 28.0-28.9,adult  No recent weight changes    Outpatient Encounter Medications as of 05/07/2018  Medication Sig  . amLODipine-benazepril (LOTREL) 5-20 MG capsule Take 1 capsule by mouth daily.  Marland Kitchen aspirin 81 MG tablet Take 81 mg by mouth daily.    . blood glucose meter kit and supplies Dispense based on patient and insurance preference. Use up to four times daily as directed. (FOR ICD-10 E10.9, E11.9).  Marland Kitchen Blood Glucose Monitoring Suppl (ACCU-CHEK AVIVA PLUS) w/Device KIT Use to check blood sugar daily  . Cholecalciferol (VITAMIN D-3) 5000 UNITS TABS Take 10,000 each by mouth daily.  . Cinnamon 500 MG TABS Take by mouth 2 (two) times daily.    Marland Kitchen  ezetimibe (ZETIA) 10 MG tablet Take 1 tablet (10 mg total) by mouth daily.  . fish oil-omega-3 fatty acids 1000 MG capsule Take 2 g by mouth daily. 4 per day 1087m per day  . glimepiride (AMARYL) 4 MG tablet TAKE 1 TABLET BY MOUTH  DAILY BEFORE BREAKFAST  . glucose blood (ACCU-CHEK AVIVA PLUS) test strip Test 1x a day- dx 250.02  . Melatonin 1 MG TABS Take by mouth. At bedtime   . metFORMIN (GLUCOPHAGE) 1000 MG tablet TAKE 1 TABLET BY MOUTH TWO  TIMES DAILY WITH MEALS  . Multiple Vitamin (MULTIVITAMIN) capsule Take 1 capsule by mouth daily.    . rosuvastatin (CRESTOR) 20 MG tablet Take 1 tablet (20 mg total) by mouth daily.       New complaints: None today  Social history: Lives with his wife and is helping his son raise his grand daughter.   Review of Systems  Constitutional: Negative for diaphoresis and weight loss.  Eyes: Negative for blurred vision, double vision and pain.  Respiratory: Negative for shortness of breath.   Cardiovascular: Negative for chest pain, palpitations, orthopnea and leg swelling.  Gastrointestinal: Negative for abdominal pain.  Skin: Negative for rash.  Neurological: Negative for dizziness, sensory change, loss of consciousness, weakness and headaches.  Endo/Heme/Allergies: Negative for polydipsia. Does not bruise/bleed easily.  Psychiatric/Behavioral: Negative for depression and memory loss. The patient does not have insomnia.   All other systems reviewed and are negative.      Observations/Objective: Alert  and oriented- answers all questions appropriately BS 132  Assessment and Plan: Rodney Glover calls in today with chief complaint of medical management of chronic issues  Diagnosis and orders addressed:  1. Essential hypertension Low sodium diet  2. Type 2 diabetes mellitus without complication, unspecified whether long term insulin use (HCC) Continue to watch carbsin diet Get some exercise during this covid 19 quarantine time  3.  Mixed hyperlipidemia Low fat diet  4. BMI 28.0-28.9,adult Discussed diet and exercise for person with BMI >25 Will recheck weight in 3-6 months    Previous lab results reviewed Health Maintenance reviewed Diet and exercise encouraged  Follow up plan: 3 months    I discussed the assessment and treatment plan with the patient. The patient was provided an opportunity to ask questions and all were answered. The patient agreed with the plan and demonstrated an understanding of the instructions.   The patient was advised to call back or seek an in-person evaluation if the symptoms worsen or if the condition fails to improve as anticipated.  The above assessment and management plan was discussed with the patient. The patient verbalized understanding of and has agreed to the management plan. Patient is aware to call the clinic if symptoms persist or worsen. Patient is aware when to return to the clinic for a follow-up visit. Patient educated on when it is appropriate to go to the emergency department.    I provided 12 minutes of non-face-to-face time during this encounter.    Mary-Margaret Hassell Done, FNP

## 2018-08-25 ENCOUNTER — Other Ambulatory Visit: Payer: Self-pay | Admitting: *Deleted

## 2018-08-25 DIAGNOSIS — I10 Essential (primary) hypertension: Secondary | ICD-10-CM

## 2018-08-25 MED ORDER — AMLODIPINE BESY-BENAZEPRIL HCL 5-20 MG PO CAPS: 1.0000 | ORAL_CAPSULE | Freq: Every day | ORAL | 0 refills | Status: DC

## 2018-08-25 NOTE — Telephone Encounter (Signed)
Request not received from [facility] Pt has appt in Aug

## 2018-09-07 ENCOUNTER — Other Ambulatory Visit: Payer: Self-pay

## 2018-09-08 ENCOUNTER — Encounter: Payer: Self-pay | Admitting: Nurse Practitioner

## 2018-09-08 ENCOUNTER — Ambulatory Visit: Payer: Medicare Other | Admitting: Nurse Practitioner

## 2018-09-08 VITALS — BP 143/82 | HR 69 | Temp 96.8°F | Ht 67.0 in | Wt 167.0 lb

## 2018-09-08 DIAGNOSIS — E782 Mixed hyperlipidemia: Secondary | ICD-10-CM | POA: Diagnosis not present

## 2018-09-08 DIAGNOSIS — E119 Type 2 diabetes mellitus without complications: Secondary | ICD-10-CM

## 2018-09-08 DIAGNOSIS — Z6828 Body mass index (BMI) 28.0-28.9, adult: Secondary | ICD-10-CM

## 2018-09-08 DIAGNOSIS — I10 Essential (primary) hypertension: Secondary | ICD-10-CM | POA: Diagnosis not present

## 2018-09-08 LAB — BAYER DCA HB A1C WAIVED: HB A1C (BAYER DCA - WAIVED): 7.7 % — ABNORMAL HIGH (ref <7.0)

## 2018-09-08 MED ORDER — METFORMIN HCL 1000 MG PO TABS: ORAL_TABLET | ORAL | 1 refills | Status: DC

## 2018-09-08 MED ORDER — GLIMEPIRIDE 4 MG PO TABS: ORAL_TABLET | ORAL | 1 refills | Status: DC

## 2018-09-08 MED ORDER — AMLODIPINE BESY-BENAZEPRIL HCL 5-20 MG PO CAPS: 1.0000 | ORAL_CAPSULE | Freq: Every day | ORAL | 0 refills | Status: DC

## 2018-09-08 MED ORDER — SITAGLIPTIN PHOSPHATE 100 MG PO TABS: 100.0000 mg | ORAL_TABLET | Freq: Every day | ORAL | 1 refills | Status: DC

## 2018-09-08 MED ORDER — ROSUVASTATIN CALCIUM 20 MG PO TABS: 20.0000 mg | ORAL_TABLET | Freq: Every day | ORAL | 1 refills | Status: DC

## 2018-09-08 MED ORDER — EZETIMIBE 10 MG PO TABS: 10.0000 mg | ORAL_TABLET | Freq: Every day | ORAL | 1 refills | Status: DC

## 2018-09-08 NOTE — Progress Notes (Signed)
Subjective:    Patient ID: Rodney Glover, male    DOB: [birth year], 68 y.o.   MRN: [id number]   Chief Complaint: Medical Management of Chronic Issues    HPI:  1. Type 2 diabetes mellitus without complication, unspecified whether long term insulin use (HCC) Last hgba1c was 7.4%. his fasting blood sugars have been running around 140-170. He denies nay low blood sugars.    2. Essential hypertension No c/o chest pain, sob or headache. Does ot check blood pressure at home. BP Readings from Last 3 Encounters:  01/07/18 138/70  09/04/17 140/75  06/04/17 129/76     3. Mixed hyperlipidemia tries to watch diet and stays very active. He plays golf 2 x a week and he walks the course rather than riding a golf cart.  4. BMI 28.0-28.9,adult No recent weight changes    Outpatient Encounter Medications as of 09/08/2018  Medication Sig  . amLODipine-benazepril (LOTREL) 5-20 MG capsule Take 1 capsule by mouth daily.  Marland Kitchen aspirin 81 MG tablet Take 81 mg by mouth daily.    . blood glucose meter kit and supplies Dispense based on patient and insurance preference. Use up to four times daily as directed. (FOR ICD-10 E10.9, E11.9).  Marland Kitchen Blood Glucose Monitoring Suppl (ACCU-CHEK AVIVA PLUS) w/Device KIT Use to check blood sugar daily  . Cholecalciferol (VITAMIN D-3) 5000 UNITS TABS Take 10,000 each by mouth daily.  . Cinnamon 500 MG TABS Take by mouth 2 (two) times daily.    Marland Kitchen ezetimibe (ZETIA) 10 MG tablet Take 1 tablet (10 mg total) by mouth daily.  . fish oil-omega-3 fatty acids 1000 MG capsule Take 2 g by mouth daily. 4 per day 1039m per day  . glimepiride (AMARYL) 4 MG tablet TAKE 1 TABLET BY MOUTH  DAILY BEFORE BREAKFAST  . glucose blood (ACCU-CHEK AVIVA PLUS) test strip Test 1x a day- dx 250.02  . Melatonin 1 MG TABS Take by mouth. At bedtime   . metFORMIN (GLUCOPHAGE) 1000 MG tablet TAKE 1 TABLET BY MOUTH TWO  TIMES DAILY WITH MEALS  . Multiple Vitamin (MULTIVITAMIN) capsule Take 1 capsule  by mouth daily.    . rosuvastatin (CRESTOR) 20 MG tablet Take 1 tablet (20 mg total) by mouth daily.     Past Surgical History:  Procedure Laterality Date  . APPENDECTOMY    . COLONOSCOPY    . HERNIA REPAIR    . KNEE SURGERY Bilateral 11 03 2014   Dr. BTonita Cong   Family History  Problem Relation Age of Onset  . Hyperlipidemia Mother   . Heart disease Father   . Cancer Father        bladder, and prostate  . Stroke Father   . Coronary artery disease Father   . Prostate cancer Father   . Colon cancer Father   . Hyperlipidemia Sister   . Hypertension Sister   . Prostate cancer Unknown   . Diabetes Unknown   . Heart disease Unknown   . Esophageal cancer Neg Hx   . Rectal cancer Neg Hx   . Stomach cancer Neg Hx     New complaints: None today  Social history: Helps his wife keep his granddaughter everyday  Controlled substance contract: N/A     Review of Systems  Constitutional: Negative for activity change and appetite change.  HENT: Negative.   Eyes: Negative for pain.  Respiratory: Negative for shortness of breath.   Cardiovascular: Negative for chest pain, palpitations and leg swelling.  Gastrointestinal:  Negative for abdominal pain.  Endocrine: Negative for polydipsia.  Genitourinary: Negative.   Skin: Negative for rash.  Neurological: Negative for dizziness, weakness and headaches.  Hematological: Does not bruise/bleed easily.  Psychiatric/Behavioral: Negative.   All other systems reviewed and are negative.      Objective:   Physical Exam Vitals signs and nursing note reviewed.  Constitutional:      Appearance: Normal appearance. He is well-developed.  HENT:     Head: Normocephalic.     Nose: Nose normal.  Eyes:     Pupils: Pupils are equal, round, and reactive to light.  Neck:     Musculoskeletal: Normal range of motion and neck supple.     Thyroid: No thyroid mass or thyromegaly.     Vascular: No carotid bruit or JVD.     Trachea: Phonation  normal.  Cardiovascular:     Rate and Rhythm: Normal rate and regular rhythm.  Pulmonary:     Effort: Pulmonary effort is normal. No respiratory distress.     Breath sounds: Normal breath sounds.  Abdominal:     General: Bowel sounds are normal.     Palpations: Abdomen is soft.     Tenderness: There is no abdominal tenderness.  Musculoskeletal: Normal range of motion.  Lymphadenopathy:     Cervical: No cervical adenopathy.  Skin:    General: Skin is warm and dry.  Neurological:     Mental Status: He is alert and oriented to person, place, and time.  Psychiatric:        Behavior: Behavior normal.        Thought Content: Thought content normal.        Judgment: Judgment normal.    BP (!) 143/82   Pulse 69   Temp (!) 96.8 F (36 C) (Oral)   Ht '5\' 7"'  (1.702 m)   Wt 167 lb (75.8 kg)   BMI 26.16 kg/m    hgba1c 7.7%      Assessment & Plan:  Rodney Glover comes in today with chief complaint of Medical Management of Chronic Issues   Diagnosis and orders addressed:  1. Type 2 diabetes mellitus without complication, without long-term current use of insulin (HCC) Stricter carb counting Added januvia - metFORMIN (GLUCOPHAGE) 1000 MG tablet; TAKE 1 TABLET BY MOUTH TWO  TIMES DAILY WITH MEALS  Dispense: 180 tablet; Refill: 1 - sitaGLIPtin (JANUVIA) 100 MG tablet; Take 1 tablet (100 mg total) by mouth daily.  Dispense: 90 tablet; Refill: - Bayer DCA Hb A1c Waived  2. Essential hypertension Low sodium diet - CMP14+EGFR - amLODipine-benazepril (LOTREL) 5-20 MG capsule; Take 1 capsule by mouth daily.  Dispense: 90 capsule; Refill: 0  3. Mixed hyperlipidemia Low fat diet - Lipid panel - rosuvastatin (CRESTOR) 20 MG tablet; Take 1 tablet (20 mg total) by mouth daily.  Dispense: 90 tablet; Refill: 1 - ezetimibe (ZETIA) 10 MG tablet; Take 1 tablet (10 mg total) by mouth daily.  Dispense: 90 tablet; Refill: 1  4. BMI 28.0-28.9,adult Discussed diet and exercise for person with  BMI >25 Will recheck weight in 3-6 months    Labs pending Health Maintenance reviewed Diet and exercise encouraged  Follow up plan: 3 months   Mary-Margaret Hassell Done, FNP

## 2018-09-08 NOTE — Patient Instructions (Signed)

## 2018-09-09 LAB — CMP14+EGFR
ALT: 39 IU/L (ref 0–44)
AST: 36 IU/L (ref 0–40)
Albumin/Globulin Ratio: 1.9 (ref 1.2–2.2)
Albumin: 4.5 g/dL (ref 3.8–4.8)
Alkaline Phosphatase: 71 IU/L (ref 39–117)
BUN/Creatinine Ratio: 16 (ref 10–24)
BUN: 13 mg/dL (ref 8–27)
Bilirubin Total: 0.5 mg/dL (ref 0.0–1.2)
CO2: 22 mmol/L (ref 20–29)
Calcium: 9.4 mg/dL (ref 8.6–10.2)
Chloride: 100 mmol/L (ref 96–106)
Creatinine, Ser: 0.83 mg/dL (ref 0.76–1.27)
GFR calc Af Amer: 105 mL/min/{1.73_m2} (ref >59)
GFR calc non Af Amer: 91 mL/min/{1.73_m2} (ref >59)
Globulin, Total: 2.4 g/dL (ref 1.5–4.5)
Glucose: 127 mg/dL — ABNORMAL HIGH (ref 65–99)
Potassium: 4.8 mmol/L (ref 3.5–5.2)
Sodium: 139 mmol/L (ref 134–144)
Total Protein: 6.9 g/dL (ref 6.0–8.5)

## 2018-09-09 LAB — LIPID PANEL
Chol/HDL Ratio: 3 ratio (ref 0.0–5.0)
Cholesterol, Total: 137 mg/dL (ref 100–199)
HDL: 46 mg/dL (ref >39)
LDL Calculated: 74 mg/dL (ref 0–99)
Triglycerides: 86 mg/dL (ref 0–149)
VLDL Cholesterol Cal: 17 mg/dL (ref 5–40)

## 2018-10-19 ENCOUNTER — Other Ambulatory Visit: Payer: Self-pay | Admitting: Nurse Practitioner

## 2018-10-19 DIAGNOSIS — I10 Essential (primary) hypertension: Secondary | ICD-10-CM

## 2018-12-08 ENCOUNTER — Ambulatory Visit: Payer: Self-pay | Admitting: Nurse Practitioner

## 2018-12-09 ENCOUNTER — Other Ambulatory Visit: Payer: Self-pay

## 2018-12-10 ENCOUNTER — Other Ambulatory Visit: Payer: Self-pay

## 2018-12-10 ENCOUNTER — Encounter: Payer: Self-pay | Admitting: Nurse Practitioner

## 2018-12-10 ENCOUNTER — Ambulatory Visit: Payer: Medicare Other | Admitting: Nurse Practitioner

## 2018-12-10 VITALS — BP 138/74 | HR 64 | Temp 99.3°F | Resp 20 | Ht 67.0 in | Wt 163.0 lb

## 2018-12-10 DIAGNOSIS — E782 Mixed hyperlipidemia: Secondary | ICD-10-CM | POA: Diagnosis not present

## 2018-12-10 DIAGNOSIS — Z6828 Body mass index (BMI) 28.0-28.9, adult: Secondary | ICD-10-CM

## 2018-12-10 DIAGNOSIS — I10 Essential (primary) hypertension: Secondary | ICD-10-CM | POA: Diagnosis not present

## 2018-12-10 DIAGNOSIS — E119 Type 2 diabetes mellitus without complications: Secondary | ICD-10-CM

## 2018-12-10 LAB — BAYER DCA HB A1C WAIVED: HB A1C (BAYER DCA - WAIVED): 6.6 % (ref <7.0)

## 2018-12-10 NOTE — Progress Notes (Signed)
Subjective:    Patient ID: Rodney Glover, male    DOB: Nov 27, 1950, 68 y.o.   MRN: [id number]   Chief Complaint: Medical Management of Chronic Issues    HPI:  1. Essential hypertension No c/o chest pain, sob or headache. Does not check blood pressure at home. BP Readings from Last 3 Encounters:  09/08/18 (!) 143/82  01/07/18 138/70  09/04/17 140/75     2. Mixed hyperlipidemia Doe watch diet and does a lot of exercise. He walks 18 holes of golf- 2-3 x a week. Lab Results  Component Value Date   CHOL 137 09/08/2018   HDL 46 09/08/2018   LDLCALC 74 09/08/2018   TRIG 86 09/08/2018   CHOLHDL 3.0 09/08/2018     3. Type 2 diabetes mellitus without complication, without long-term current use of insulin (HCC) Fasting blood sugars are running  Below 140 consistently. We added januvia at last visit. He genies any low blood sugars. Lab Results  Component Value Date   HGBA1C 7.7 (H) 09/08/2018     4. BMI 28.0-28.9,adult No recent weight changes Wt Readings from Last 3 Encounters:  09/08/18 167 lb (75.8 kg)  01/07/18 168 lb (76.2 kg)  09/04/17 164 lb (74.4 kg)   BMI Readings from Last 3 Encounters:  09/08/18 26.16 kg/m  01/07/18 26.31 kg/m  09/04/17 25.69 kg/m       Outpatient Encounter Medications as of 12/10/2018  Medication Sig  . amLODipine-benazepril (LOTREL) 5-20 MG capsule TAKE 1 CAPSULE BY MOUTH  DAILY  . aspirin 81 MG tablet Take 81 mg by mouth daily.    . blood glucose meter kit and supplies Dispense based on patient and insurance preference. Use up to four times daily as directed. (FOR ICD-10 E10.9, E11.9).  Marland Kitchen Blood Glucose Monitoring Suppl (ACCU-CHEK AVIVA PLUS) w/Device KIT Use to check blood sugar daily  . Cholecalciferol (VITAMIN D-3) 5000 UNITS TABS Take 10,000 each by mouth daily.  . Cinnamon 500 MG TABS Take by mouth 2 (two) times daily.    Marland Kitchen ezetimibe (ZETIA) 10 MG tablet Take 1 tablet (10 mg total) by mouth daily.  . fish oil-omega-3 fatty  acids 1000 MG capsule Take 2 g by mouth daily. 4 per day 1019m per day  . glimepiride (AMARYL) 4 MG tablet TAKE 1 TABLET BY MOUTH  DAILY BEFORE BREAKFAST  . glucose blood (ACCU-CHEK AVIVA PLUS) test strip Test 1x a day- dx 250.02  . Melatonin 1 MG TABS Take by mouth. At bedtime   . metFORMIN (GLUCOPHAGE) 1000 MG tablet TAKE 1 TABLET BY MOUTH TWO  TIMES DAILY WITH MEALS  . Multiple Vitamin (MULTIVITAMIN) capsule Take 1 capsule by mouth daily.    . rosuvastatin (CRESTOR) 20 MG tablet Take 1 tablet (20 mg total) by mouth daily.  . sitaGLIPtin (JANUVIA) 100 MG tablet Take 1 tablet (100 mg total) by mouth daily.     Past Surgical History:  Procedure Laterality Date  . APPENDECTOMY    . COLONOSCOPY    . HERNIA REPAIR    . KNEE SURGERY Bilateral 11 03 2014   Dr. BTonita Cong   Family History  Problem Relation Age of Onset  . Hyperlipidemia Mother   . Heart disease Father   . Cancer Father        bladder, and prostate  . Stroke Father   . Coronary artery disease Father   . Prostate cancer Father   . Colon cancer Father   . Hyperlipidemia Sister   . Hypertension  Sister   . Prostate cancer Unknown   . Diabetes Unknown   . Heart disease Unknown   . Esophageal cancer Neg Hx   . Rectal cancer Neg Hx   . Stomach cancer Neg Hx     New complaints: None today  Social history: Lives with wife. Both are retired.  Controlled substance contract: n/a    Review of Systems  Constitutional: Negative for activity change and appetite change.  HENT: Negative.   Eyes: Negative for pain.  Respiratory: Negative for shortness of breath.   Cardiovascular: Negative for chest pain, palpitations and leg swelling.  Gastrointestinal: Negative for abdominal pain.  Endocrine: Negative for polydipsia.  Genitourinary: Negative.   Skin: Negative for rash.  Neurological: Negative for dizziness, weakness and headaches.  Hematological: Does not bruise/bleed easily.  Psychiatric/Behavioral: Negative.    All other systems reviewed and are negative.      Objective:   Physical Exam Vitals signs and nursing note reviewed.  Constitutional:      Appearance: Normal appearance. He is well-developed.  HENT:     Head: Normocephalic.     Nose: Nose normal.  Eyes:     Pupils: Pupils are equal, round, and reactive to light.  Neck:     Musculoskeletal: Normal range of motion and neck supple.     Thyroid: No thyroid mass or thyromegaly.     Vascular: No carotid bruit or JVD.     Trachea: Phonation normal.  Cardiovascular:     Rate and Rhythm: Normal rate and regular rhythm.  Pulmonary:     Effort: Pulmonary effort is normal. No respiratory distress.     Breath sounds: Normal breath sounds.  Abdominal:     General: Bowel sounds are normal.     Palpations: Abdomen is soft.     Tenderness: There is no abdominal tenderness.  Musculoskeletal: Normal range of motion.  Lymphadenopathy:     Cervical: No cervical adenopathy.  Skin:    General: Skin is warm and dry.  Neurological:     Mental Status: He is alert and oriented to person, place, and time.  Psychiatric:        Behavior: Behavior normal.        Thought Content: Thought content normal.        Judgment: Judgment normal.     BP 138/74 (BP Location: Left Arm)   Pulse 64   Temp 99.3 F (37.4 C) (Temporal)   Resp 20   Ht '5\' 7"'  (1.702 m)   Wt 163 lb (73.9 kg)   SpO2 97%   BMI 25.53 kg/m   hgba1c 6.6%      Assessment & Plan:  Rodney Glover comes in today with chief complaint of Medical Management of Chronic Issues   Diagnosis and orders addressed:  1. Essential hypertension Low sodium diet - CMP14+EGFR  2. Mixed hyperlipidemia Ow fat diet - Lipid panel  3. Type 2 diabetes mellitus without complication, without long-term current use of insulin (HCC) continnue to watch carbs in diet - hgba1c - Microalbumin / creatinine urine ratio  4. BMI 28.0-28.9,adult Discussed diet and exercise for person with BMI >25  Will recheck weight in 3-6 months    Labs pending Health Maintenance reviewed Diet and exercise encouraged  Follow up plan: 3 months   Mary-Margaret Hassell Done, FNP

## 2018-12-10 NOTE — Patient Instructions (Signed)
Diabetes Mellitus and Foot Care Foot care is an important part of your health, especially when you have diabetes. Diabetes may cause you to have problems because of poor blood flow (circulation) to your feet and legs, which can cause your skin to:  Become thinner and drier.  Break more easily.  Heal more slowly.  Peel and crack. You may also have nerve damage (neuropathy) in your legs and feet, causing decreased feeling in them. This means that you may not notice minor injuries to your feet that could lead to more serious problems. Noticing and addressing any potential problems early is the best way to prevent future foot problems. How to care for your feet Foot hygiene  Wash your feet daily with warm water and mild soap. Do not use hot water. Then, pat your feet and the areas between your toes until they are completely dry. Do not soak your feet as this can dry your skin.  Trim your toenails straight across. Do not dig under them or around the cuticle. File the edges of your nails with an emery board or nail file.  Apply a moisturizing lotion or petroleum jelly to the skin on your feet and to dry, brittle toenails. Use lotion that does not contain alcohol and is unscented. Do not apply lotion between your toes. Shoes and socks  Wear clean socks or stockings every day. Make sure they are not too tight. Do not wear knee-high stockings since they may decrease blood flow to your legs.  Wear shoes that fit properly and have enough cushioning. Always look in your shoes before you put them on to be sure there are no objects inside.  To break in new shoes, wear them for just a few hours a day. This prevents injuries on your feet. Wounds, scrapes, corns, and calluses  Check your feet daily for blisters, cuts, bruises, sores, and redness. If you cannot see the bottom of your feet, use a mirror or ask someone for help.  Do not cut corns or calluses or try to remove them with medicine.  If you  find a minor scrape, cut, or break in the skin on your feet, keep it and the skin around it clean and dry. You may clean these areas with mild soap and water. Do not clean the area with peroxide, alcohol, or iodine.  If you have a wound, scrape, corn, or callus on your foot, look at it several times a day to make sure it is healing and not infected. Check for: ? Redness, swelling, or pain. ? Fluid or blood. ? Warmth. ? Pus or a bad smell. General instructions  Do not cross your legs. This may decrease blood flow to your feet.  Do not use heating pads or hot water bottles on your feet. They may burn your skin. If you have lost feeling in your feet or legs, you may not know this is happening until it is too late.  Protect your feet from hot and cold by wearing shoes, such as at the beach or on hot pavement.  Schedule a complete foot exam at least once a year (annually) or more often if you have foot problems. If you have foot problems, report any cuts, sores, or bruises to your health care provider immediately. Contact a health care provider if:  You have a medical condition that increases your risk of infection and you have any cuts, sores, or bruises on your feet.  You have an injury that is not   healing.  You have redness on your legs or feet.  You feel burning or tingling in your legs or feet.  You have pain or cramps in your legs and feet.  Your legs or feet are numb.  Your feet always feel cold.  You have pain around a toenail. Get help right away if:  You have a wound, scrape, corn, or callus on your foot and: ? You have pain, swelling, or redness that gets worse. ? You have fluid or blood coming from the wound, scrape, corn, or callus. ? Your wound, scrape, corn, or callus feels warm to the touch. ? You have pus or a bad smell coming from the wound, scrape, corn, or callus. ? You have a fever. ? You have a red line going up your leg. Summary  Check your feet every day  for cuts, sores, red spots, swelling, and blisters.  Moisturize feet and legs daily.  Wear shoes that fit properly and have enough cushioning.  If you have foot problems, report any cuts, sores, or bruises to your health care provider immediately.  Schedule a complete foot exam at least once a year (annually) or more often if you have foot problems. This information is not intended to replace advice given to you by your health care provider. Make sure you discuss any questions you have with your health care provider. Document Released: 01/19/2000 Document Revised: 03/05/2017 Document Reviewed: 02/23/2016 Elsevier Patient Education  2020 [employer].  

## 2018-12-11 LAB — CMP14+EGFR
ALT: 27 IU/L (ref 0–44)
AST: 29 IU/L (ref 0–40)
Albumin/Globulin Ratio: 2.1 (ref 1.2–2.2)
Albumin: 4.5 g/dL (ref 3.8–4.8)
Alkaline Phosphatase: 64 IU/L (ref 39–117)
BUN/Creatinine Ratio: 9 — ABNORMAL LOW (ref 10–24)
BUN: 7 mg/dL — ABNORMAL LOW (ref 8–27)
Bilirubin Total: 0.3 mg/dL (ref 0.0–1.2)
CO2: 24 mmol/L (ref 20–29)
Calcium: 9.2 mg/dL (ref 8.6–10.2)
Chloride: 99 mmol/L (ref 96–106)
Creatinine, Ser: 0.75 mg/dL — ABNORMAL LOW (ref 0.76–1.27)
GFR calc Af Amer: 110 mL/min/{1.73_m2} (ref >59)
GFR calc non Af Amer: 95 mL/min/{1.73_m2} (ref >59)
Globulin, Total: 2.1 g/dL (ref 1.5–4.5)
Glucose: 98 mg/dL (ref 65–99)
Potassium: 4.5 mmol/L (ref 3.5–5.2)
Sodium: 141 mmol/L (ref 134–144)
Total Protein: 6.6 g/dL (ref 6.0–8.5)

## 2018-12-11 LAB — LIPID PANEL
Chol/HDL Ratio: 2.5 ratio (ref 0.0–5.0)
Cholesterol, Total: 116 mg/dL (ref 100–199)
HDL: 46 mg/dL (ref >39)
LDL Chol Calc (NIH): 57 mg/dL (ref 0–99)
Triglycerides: 61 mg/dL (ref 0–149)
VLDL Cholesterol Cal: 13 mg/dL (ref 5–40)

## 2019-01-18 ENCOUNTER — Other Ambulatory Visit: Payer: Self-pay | Admitting: Nurse Practitioner

## 2019-01-18 DIAGNOSIS — E119 Type 2 diabetes mellitus without complications: Secondary | ICD-10-CM

## 2019-01-22 ENCOUNTER — Other Ambulatory Visit: Payer: Self-pay | Admitting: Nurse Practitioner

## 2019-01-22 DIAGNOSIS — E119 Type 2 diabetes mellitus without complications: Secondary | ICD-10-CM

## 2019-01-22 DIAGNOSIS — E782 Mixed hyperlipidemia: Secondary | ICD-10-CM

## 2019-02-10 ENCOUNTER — Telehealth: Payer: Self-pay | Admitting: Nurse Practitioner

## 2019-02-10 DIAGNOSIS — E119 Type 2 diabetes mellitus without complications: Secondary | ICD-10-CM

## 2019-02-10 DIAGNOSIS — E782 Mixed hyperlipidemia: Secondary | ICD-10-CM

## 2019-02-10 NOTE — Telephone Encounter (Signed)
What is the name of the medication? ezetimibe (Rodney Glover) 10 MG tablet, metFORMIN (Rodney Glover) 1000 MG tablet, rosuvastatin (Rodney Glover) 20 MG tablet, glimepiride (Rodney Glover) 4 MG tabletV, Rodney Glover 100 MG tablet  Have you contacted your pharmacy to request a refill? NO BECAUSE HE HAS SWITCHED INSURANCES. HE HAS SWITCHED TO [facility] FAX NUMBER [phone]  Which pharmacy would you like this sent to? [facility]   Patient notified that their request is being sent to the clinical staff for review and that they should receive a call once it is complete. If they do not receive a call within 24 hours they can check with their pharmacy or our office.

## 2019-02-23 ENCOUNTER — Telehealth: Payer: Self-pay | Admitting: Nurse Practitioner

## 2019-02-23 MED ORDER — ACCU-CHEK AVIVA PLUS VI STRP: ORAL_STRIP | 11 refills | Status: DC

## 2019-02-23 MED ORDER — GLIMEPIRIDE 4 MG PO TABS: 4.0000 mg | ORAL_TABLET | Freq: Every day | ORAL | 1 refills | Status: DC

## 2019-02-23 MED ORDER — ROSUVASTATIN CALCIUM 20 MG PO TABS: 20.0000 mg | ORAL_TABLET | Freq: Every day | ORAL | 1 refills | Status: DC

## 2019-02-23 MED ORDER — EZETIMIBE 10 MG PO TABS: 10.0000 mg | ORAL_TABLET | Freq: Every day | ORAL | 1 refills | Status: DC

## 2019-02-23 MED ORDER — METFORMIN HCL 1000 MG PO TABS: ORAL_TABLET | ORAL | 1 refills | Status: DC

## 2019-02-23 MED ORDER — SITAGLIPTIN PHOSPHATE 100 MG PO TABS: 100.0000 mg | ORAL_TABLET | Freq: Every day | ORAL | 1 refills | Status: DC

## 2019-02-23 NOTE — Telephone Encounter (Signed)
Pt called and aware meds and strips sent in.

## 2019-03-19 ENCOUNTER — Other Ambulatory Visit: Payer: Self-pay

## 2019-03-22 ENCOUNTER — Other Ambulatory Visit: Payer: Self-pay

## 2019-03-22 ENCOUNTER — Encounter: Payer: Self-pay | Admitting: Nurse Practitioner

## 2019-03-22 ENCOUNTER — Ambulatory Visit: Payer: Medicare PPO | Admitting: Nurse Practitioner

## 2019-03-22 VITALS — BP 143/77 | HR 64 | Temp 98.9°F | Resp 20 | Ht 67.0 in | Wt 164.0 lb

## 2019-03-22 DIAGNOSIS — I10 Essential (primary) hypertension: Secondary | ICD-10-CM | POA: Diagnosis not present

## 2019-03-22 DIAGNOSIS — F5101 Primary insomnia: Secondary | ICD-10-CM

## 2019-03-22 DIAGNOSIS — E782 Mixed hyperlipidemia: Secondary | ICD-10-CM

## 2019-03-22 DIAGNOSIS — Z6828 Body mass index (BMI) 28.0-28.9, adult: Secondary | ICD-10-CM

## 2019-03-22 DIAGNOSIS — E119 Type 2 diabetes mellitus without complications: Secondary | ICD-10-CM | POA: Diagnosis not present

## 2019-03-22 LAB — BAYER DCA HB A1C WAIVED: HB A1C (BAYER DCA - WAIVED): 6.6 % (ref <7.0)

## 2019-03-22 MED ORDER — AMLODIPINE BESY-BENAZEPRIL HCL 5-20 MG PO CAPS: 1.0000 | ORAL_CAPSULE | Freq: Every day | ORAL | 1 refills | Status: DC

## 2019-03-22 MED ORDER — SITAGLIPTIN PHOSPHATE 100 MG PO TABS: 100.0000 mg | ORAL_TABLET | Freq: Every day | ORAL | 1 refills | Status: DC

## 2019-03-22 MED ORDER — GLIMEPIRIDE 4 MG PO TABS: 4.0000 mg | ORAL_TABLET | Freq: Every day | ORAL | 1 refills | Status: DC

## 2019-03-22 MED ORDER — METFORMIN HCL 1000 MG PO TABS: ORAL_TABLET | ORAL | 1 refills | Status: DC

## 2019-03-22 MED ORDER — ROSUVASTATIN CALCIUM 20 MG PO TABS: 20.0000 mg | ORAL_TABLET | Freq: Every day | ORAL | 1 refills | Status: DC

## 2019-03-22 MED ORDER — EZETIMIBE 10 MG PO TABS: 10.0000 mg | ORAL_TABLET | Freq: Every day | ORAL | 1 refills | Status: DC

## 2019-03-22 NOTE — Patient Instructions (Signed)
Diabetes Mellitus and Foot Care Foot care is an important part of your health, especially when you have diabetes. Diabetes may cause you to have problems because of poor blood flow (circulation) to your feet and legs, which can cause your skin to:  Become thinner and drier.  Break more easily.  Heal more slowly.  Peel and crack. You may also have nerve damage (neuropathy) in your legs and feet, causing decreased feeling in them. This means that you may not notice minor injuries to your feet that could lead to more serious problems. Noticing and addressing any potential problems early is the best way to prevent future foot problems. How to care for your feet Foot hygiene  Wash your feet daily with warm water and mild soap. Do not use hot water. Then, pat your feet and the areas between your toes until they are completely dry. Do not soak your feet as this can dry your skin.  Trim your toenails straight across. Do not dig under them or around the cuticle. File the edges of your nails with an emery board or nail file.  Apply a moisturizing lotion or petroleum jelly to the skin on your feet and to dry, brittle toenails. Use lotion that does not contain alcohol and is unscented. Do not apply lotion between your toes. Shoes and socks  Wear clean socks or stockings every day. Make sure they are not too tight. Do not wear knee-high stockings since they may decrease blood flow to your legs.  Wear shoes that fit properly and have enough cushioning. Always look in your shoes before you put them on to be sure there are no objects inside.  To break in new shoes, wear them for just a few hours a day. This prevents injuries on your feet. Wounds, scrapes, corns, and calluses  Check your feet daily for blisters, cuts, bruises, sores, and redness. If you cannot see the bottom of your feet, use a mirror or ask someone for help.  Do not cut corns or calluses or try to remove them with medicine.  If you  find a minor scrape, cut, or break in the skin on your feet, keep it and the skin around it clean and dry. You may clean these areas with mild soap and water. Do not clean the area with peroxide, alcohol, or iodine.  If you have a wound, scrape, corn, or callus on your foot, look at it several times a day to make sure it is healing and not infected. Check for: ? Redness, swelling, or pain. ? Fluid or blood. ? Warmth. ? Pus or a bad smell. General instructions  Do not cross your legs. This may decrease blood flow to your feet.  Do not use heating pads or hot water bottles on your feet. They may burn your skin. If you have lost feeling in your feet or legs, you may not know this is happening until it is too late.  Protect your feet from hot and cold by wearing shoes, such as at the beach or on hot pavement.  Schedule a complete foot exam at least once a year (annually) or more often if you have foot problems. If you have foot problems, report any cuts, sores, or bruises to your health care provider immediately. Contact a health care provider if:  You have a medical condition that increases your risk of infection and you have any cuts, sores, or bruises on your feet.  You have an injury that is not   healing.  You have redness on your legs or feet.  You feel burning or tingling in your legs or feet.  You have pain or cramps in your legs and feet.  Your legs or feet are numb.  Your feet always feel cold.  You have pain around a toenail. Get help right away if:  You have a wound, scrape, corn, or callus on your foot and: ? You have pain, swelling, or redness that gets worse. ? You have fluid or blood coming from the wound, scrape, corn, or callus. ? Your wound, scrape, corn, or callus feels warm to the touch. ? You have pus or a bad smell coming from the wound, scrape, corn, or callus. ? You have a fever. ? You have a red line going up your leg. Summary  Check your feet every day  for cuts, sores, red spots, swelling, and blisters.  Moisturize feet and legs daily.  Wear shoes that fit properly and have enough cushioning.  If you have foot problems, report any cuts, sores, or bruises to your health care provider immediately.  Schedule a complete foot exam at least once a year (annually) or more often if you have foot problems. This information is not intended to replace advice given to you by your health care provider. Make sure you discuss any questions you have with your health care provider. Document Revised: 10/14/2018 Document Reviewed: 02/23/2016 Elsevier Patient Education  2020 Elsevier Inc.  

## 2019-03-22 NOTE — Progress Notes (Signed)
Subjective:    Patient ID: Rodney Glover, male    DOB: 05-07-1950, 69 y.o.   MRN: [id number]   Chief Complaint: Medical Management of Chronic Issues    HPI:  1. Type 2 diabetes mellitus without complication, without long-term current use of insulin (HCC) Fasting blood sugars running below 120. He has had no low blood sugars.  Lab Results  Component Value Date   HGBA1C 6.6 12/10/2018     2. Mixed hyperlipidemia Watches fat sin diet and stays very active. Walks 3x a week playing golf. Lab Results  Component Value Date   CHOL 116 12/10/2018   HDL 46 12/10/2018   LDLCALC 57 12/10/2018   TRIG 61 12/10/2018   CHOLHDL 2.5 12/10/2018     3. Essential hypertension No c/o chest pain, sob or headache. Does not check blood pressure at home. BP Readings from Last 3 Encounters:  03/22/19 (!) 143/77  12/10/18 138/74  09/08/18 (!) 143/82     4. BMI 28.0-28.9 Weight is down several lbs Wt Readings from Last 3 Encounters:  03/22/19 164 lb (74.4 kg)  12/10/18 163 lb (73.9 kg)  09/08/18 167 lb (75.8 kg)   BMI Readings from Last 3 Encounters:  03/22/19 25.69 kg/m  12/10/18 25.53 kg/m  09/08/18 26.16 kg/m     Outpatient Encounter Medications as of 03/22/2019  Medication Sig  . amLODipine-benazepril (LOTREL) 5-20 MG capsule TAKE 1 CAPSULE BY MOUTH  DAILY  . aspirin 81 MG tablet Take 81 mg by mouth daily.    . Blood Glucose Monitoring Suppl (ACCU-CHEK AVIVA PLUS) w/Device KIT Use to check blood sugar daily  . Cholecalciferol (VITAMIN D-3) 5000 UNITS TABS Take 10,000 each by mouth daily.  . Cinnamon 500 MG TABS Take by mouth 2 (two) times daily.    Marland Kitchen ezetimibe (ZETIA) 10 MG tablet Take 1 tablet (10 mg total) by mouth daily.  . fish oil-omega-3 fatty acids 1000 MG capsule Take 2 g by mouth daily. 4 per day 1044m per day  . glimepiride (AMARYL) 4 MG tablet Take 1 tablet (4 mg total) by mouth daily with breakfast.  . glucose blood (ACCU-CHEK AVIVA PLUS) test strip Test 1x a  day- dx 250.02  . Melatonin 1 MG TABS Take by mouth. At bedtime   . metFORMIN (GLUCOPHAGE) 1000 MG tablet TAKE 1 TABLET BY MOUTH  TWICE DAILY WITH MEALS  . Multiple Vitamin (MULTIVITAMIN) capsule Take 1 capsule by mouth daily.    . rosuvastatin (CRESTOR) 20 MG tablet Take 1 tablet (20 mg total) by mouth daily.  . sitaGLIPtin (JANUVIA) 100 MG tablet Take 1 tablet (100 mg total) by mouth daily.    Past Surgical History:  Procedure Laterality Date  . APPENDECTOMY    . COLONOSCOPY    . HERNIA REPAIR    . KNEE SURGERY Bilateral 11 03 2014   Dr. BTonita Cong   Family History  Problem Relation Age of Onset  . Hyperlipidemia Mother   . Heart disease Father   . Cancer Father        bladder, and prostate  . Stroke Father   . Coronary artery disease Father   . Prostate cancer Father   . Colon cancer Father   . Hyperlipidemia Sister   . Hypertension Sister   . Prostate cancer Other   . Diabetes Other   . Heart disease Other   . Esophageal cancer Neg Hx   . Rectal cancer Neg Hx   . Stomach cancer Neg Hx  New complaints: Takes melatonin at night to sleep. Says he wakes up around 4:30 every morning. He really doe snot want another prescription med.  Social history: Lives with wife and is still taking care of granddaughter.  Controlled substance contract: n/a    Review of Systems  Constitutional: Negative for diaphoresis.  Eyes: Negative for pain.  Respiratory: Negative for shortness of breath.   Cardiovascular: Negative for chest pain, palpitations and leg swelling.  Gastrointestinal: Negative for abdominal pain.  Endocrine: Negative for polydipsia.  Skin: Negative for rash.  Neurological: Negative for dizziness, weakness and headaches.  Hematological: Does not bruise/bleed easily.  All other systems reviewed and are negative.      Objective:   Physical Exam Vitals and nursing note reviewed.  Constitutional:      Appearance: Normal appearance. He is well-developed.   HENT:     Head: Normocephalic.     Nose: Nose normal.  Eyes:     Pupils: Pupils are equal, round, and reactive to light.  Neck:     Thyroid: No thyroid mass or thyromegaly.     Vascular: No carotid bruit or JVD.     Trachea: Phonation normal.  Cardiovascular:     Rate and Rhythm: Normal rate and regular rhythm.  Pulmonary:     Effort: Pulmonary effort is normal. No respiratory distress.     Breath sounds: Normal breath sounds.  Abdominal:     General: Bowel sounds are normal.     Palpations: Abdomen is soft.     Tenderness: There is no abdominal tenderness.  Musculoskeletal:        General: Normal range of motion.     Cervical back: Normal range of motion and neck supple.  Lymphadenopathy:     Cervical: No cervical adenopathy.  Skin:    General: Skin is warm and dry.  Neurological:     Mental Status: He is alert and oriented to person, place, and time.  Psychiatric:        Behavior: Behavior normal.        Thought Content: Thought content normal.        Judgment: Judgment normal.    BP (!) 143/77 (BP Location: Left Arm, Cuff Size: Normal)   Pulse 64   Temp 98.9 F (37.2 C)   Resp 20   Ht 5' 7"  (1.702 m)   Wt 164 lb (74.4 kg)   SpO2 98%   BMI 25.69 kg/m   HGBA1c 6.6     Assessment & Plan:  CLINTON DRAGONE comes in today with chief complaint of Medical Management of Chronic Issues   Diagnosis and orders addressed:  1. Type 2 diabetes mellitus without complication, without long-term current use of insulin (HCC) Continue to wtahc carbsin diet - CBC with Differential/Platelet - Bayer DCA Hb A1c Waived - glimepiride (AMARYL) 4 MG tablet; Take 1 tablet (4 mg total) by mouth daily with breakfast.  Dispense: 90 tablet; Refill: 1 - sitaGLIPtin (JANUVIA) 100 MG tablet; Take 1 tablet (100 mg total) by mouth daily.  Dispense: 90 tablet; Refill: 1 - metFORMIN (GLUCOPHAGE) 1000 MG tablet; TAKE 1 TABLET BY MOUTH  TWICE DAILY WITH MEALS  Dispense: 180 tablet; Refill:  1  2. Mixed hyperlipidemia Low fat diet - CBC with Differential/Platelet - Lipid panel - rosuvastatin (CRESTOR) 20 MG tablet; Take 1 tablet (20 mg total) by mouth daily.  Dispense: 90 tablet; Refill: 1 - ezetimibe (ZETIA) 10 MG tablet; Take 1 tablet (10 mg total) by mouth daily.  Dispense: 90 tablet; Refill: 1  3. Essential hypertension Low sodium diet - CBC with Differential/Platelet - CMP14+EGFR - amLODipine-benazepril (LOTREL) 5-20 MG capsule; Take 1 capsule by mouth daily.  Dispense: 90 capsule; Refill: 1  4. BMI 28.0-28.9,adult Discussed diet and exercise for person with BMI >25 Will recheck weight in 3-6 months  5. Primary insomnia Bedtime routine   Labs pending Health Maintenance reviewed Diet and exercise encouraged  Follow up plan: 3 months   Mary-Margaret Hassell Done, FNP

## 2019-03-23 LAB — CMP14+EGFR
ALT: 31 IU/L (ref 0–44)
AST: 33 IU/L (ref 0–40)
Albumin/Globulin Ratio: 1.8 (ref 1.2–2.2)
Albumin: 4.5 g/dL (ref 3.8–4.8)
Alkaline Phosphatase: 66 IU/L (ref 39–117)
BUN/Creatinine Ratio: 10 (ref 10–24)
BUN: 7 mg/dL — ABNORMAL LOW (ref 8–27)
Bilirubin Total: 0.4 mg/dL (ref 0.0–1.2)
CO2: 24 mmol/L (ref 20–29)
Calcium: 9.4 mg/dL (ref 8.6–10.2)
Chloride: 100 mmol/L (ref 96–106)
Creatinine, Ser: 0.71 mg/dL — ABNORMAL LOW (ref 0.76–1.27)
GFR calc Af Amer: 112 mL/min/{1.73_m2} (ref >59)
GFR calc non Af Amer: 97 mL/min/{1.73_m2} (ref >59)
Globulin, Total: 2.5 g/dL (ref 1.5–4.5)
Glucose: 135 mg/dL — ABNORMAL HIGH (ref 65–99)
Potassium: 4.4 mmol/L (ref 3.5–5.2)
Sodium: 141 mmol/L (ref 134–144)
Total Protein: 7 g/dL (ref 6.0–8.5)

## 2019-03-23 LAB — CBC WITH DIFFERENTIAL/PLATELET
Basophils Absolute: 0.1 10*3/uL (ref 0.0–0.2)
Basos: 1 %
EOS (ABSOLUTE): 0.4 10*3/uL (ref 0.0–0.4)
Eos: 6 %
Hematocrit: 45.4 % (ref 37.5–51.0)
Hemoglobin: 15.3 g/dL (ref 13.0–17.7)
Immature Grans (Abs): 0 10*3/uL (ref 0.0–0.1)
Immature Granulocytes: 0 %
Lymphocytes Absolute: 2.2 10*3/uL (ref 0.7–3.1)
Lymphs: 35 %
MCH: 31 pg (ref 26.6–33.0)
MCHC: 33.7 g/dL (ref 31.5–35.7)
MCV: 92 fL (ref 79–97)
Monocytes Absolute: 0.5 10*3/uL (ref 0.1–0.9)
Monocytes: 8 %
Neutrophils Absolute: 3.1 10*3/uL (ref 1.4–7.0)
Neutrophils: 50 %
Platelets: 213 10*3/uL (ref 150–450)
RBC: 4.93 x10E6/uL (ref 4.14–5.80)
RDW: 12.8 % (ref 11.6–15.4)
WBC: 6.2 10*3/uL (ref 3.4–10.8)

## 2019-03-23 LAB — LIPID PANEL
Chol/HDL Ratio: 2.5 ratio (ref 0.0–5.0)
Cholesterol, Total: 122 mg/dL (ref 100–199)
HDL: 49 mg/dL (ref >39)
LDL Chol Calc (NIH): 59 mg/dL (ref 0–99)
Triglycerides: 67 mg/dL (ref 0–149)
VLDL Cholesterol Cal: 14 mg/dL (ref 5–40)

## 2019-05-11 LAB — HM DIABETES EYE EXAM

## 2019-06-08 DIAGNOSIS — L570 Actinic keratosis: Secondary | ICD-10-CM | POA: Diagnosis not present

## 2019-07-01 ENCOUNTER — Other Ambulatory Visit: Payer: Self-pay

## 2019-07-01 ENCOUNTER — Encounter: Payer: Self-pay | Admitting: Nurse Practitioner

## 2019-07-01 ENCOUNTER — Ambulatory Visit: Payer: Medicare PPO | Admitting: Nurse Practitioner

## 2019-07-01 ENCOUNTER — Ambulatory Visit (INDEPENDENT_AMBULATORY_CARE_PROVIDER_SITE_OTHER): Payer: Medicare PPO

## 2019-07-01 VITALS — BP 122/74 | HR 71 | Temp 98.4°F | Resp 20 | Ht 67.0 in | Wt 160.0 lb

## 2019-07-01 DIAGNOSIS — M25552 Pain in left hip: Secondary | ICD-10-CM

## 2019-07-01 DIAGNOSIS — Z6828 Body mass index (BMI) 28.0-28.9, adult: Secondary | ICD-10-CM | POA: Diagnosis not present

## 2019-07-01 DIAGNOSIS — I10 Essential (primary) hypertension: Secondary | ICD-10-CM | POA: Diagnosis not present

## 2019-07-01 DIAGNOSIS — E782 Mixed hyperlipidemia: Secondary | ICD-10-CM | POA: Diagnosis not present

## 2019-07-01 DIAGNOSIS — M5432 Sciatica, left side: Secondary | ICD-10-CM | POA: Diagnosis not present

## 2019-07-01 DIAGNOSIS — E119 Type 2 diabetes mellitus without complications: Secondary | ICD-10-CM

## 2019-07-01 LAB — BAYER DCA HB A1C WAIVED: HB A1C (BAYER DCA - WAIVED): 6.6 % (ref <7.0)

## 2019-07-01 MED ORDER — SITAGLIPTIN PHOSPHATE 100 MG PO TABS: 100.0000 mg | ORAL_TABLET | Freq: Every day | ORAL | 1 refills | Status: DC

## 2019-07-01 MED ORDER — PREDNISONE 20 MG PO TABS: ORAL_TABLET | ORAL | 0 refills | Status: DC

## 2019-07-01 MED ORDER — EZETIMIBE 10 MG PO TABS: 10.0000 mg | ORAL_TABLET | Freq: Every day | ORAL | 1 refills | Status: DC

## 2019-07-01 MED ORDER — ROSUVASTATIN CALCIUM 20 MG PO TABS: 20.0000 mg | ORAL_TABLET | Freq: Every day | ORAL | 1 refills | Status: DC

## 2019-07-01 MED ORDER — AMLODIPINE BESY-BENAZEPRIL HCL 5-20 MG PO CAPS: 1.0000 | ORAL_CAPSULE | Freq: Every day | ORAL | 1 refills | Status: DC

## 2019-07-01 MED ORDER — GLIMEPIRIDE 4 MG PO TABS: 4.0000 mg | ORAL_TABLET | Freq: Every day | ORAL | 1 refills | Status: DC

## 2019-07-01 MED ORDER — METFORMIN HCL 1000 MG PO TABS: ORAL_TABLET | ORAL | 1 refills | Status: DC

## 2019-07-01 NOTE — Progress Notes (Signed)
Subjective:    Patient ID: Rodney Glover, male    DOB: [birth year], 69 y.o.   MRN: [id number]   Chief Complaint: medical management of chronic issue   HPI:  1. Essential hypertension No c/o chest pain, sob or headache. Does not check blood pressure at home. BP Readings from Last 3 Encounters:  03/22/19 (!) 143/77  12/10/18 138/74  09/08/18 (!) 143/82     2. Mixed hyperlipidemia Does try to watch diet. Play golf and walks golf course several times a week. Lab Results  Component Value Date   CHOL 122 03/22/2019   HDL 49 03/22/2019   LDLCALC 59 03/22/2019   TRIG 67 03/22/2019   CHOLHDL 2.5 03/22/2019     3. Type 2 diabetes mellitus without complication, unspecified whether long term insulin use (HCC) Fasting blood sugars are running below 130's. He denies any low blood sugars. Lab Results  Component Value Date   HGBA1C 6.6 03/22/2019     4. BMI 28.0-28.9,adult No recent weight changes Wt Readings from Last 3 Encounters:  03/22/19 164 lb (74.4 kg)  12/10/18 163 lb (73.9 kg)  09/08/18 167 lb (75.8 kg)   BMI Readings from Last 3 Encounters:  03/22/19 25.69 kg/m  12/10/18 25.53 kg/m  09/08/18 26.16 kg/m       Outpatient Encounter Medications as of 07/01/2019  Medication Sig  . amLODipine-benazepril (LOTREL) 5-20 MG capsule Take 1 capsule by mouth daily.  Marland Kitchen aspirin 81 MG tablet Take 81 mg by mouth daily.    . Blood Glucose Monitoring Suppl (ACCU-CHEK AVIVA PLUS) w/Device KIT Use to check blood sugar daily  . Cholecalciferol (VITAMIN D-3) 5000 UNITS TABS Take 10,000 each by mouth daily.  . Cinnamon 500 MG TABS Take by mouth 2 (two) times daily.    Marland Kitchen ezetimibe (ZETIA) 10 MG tablet Take 1 tablet (10 mg total) by mouth daily.  . fish oil-omega-3 fatty acids 1000 MG capsule Take 2 g by mouth daily. 4 per day '1000mg'$  per day  . glimepiride (AMARYL) 4 MG tablet Take 1 tablet (4 mg total) by mouth daily with breakfast.  . glucose blood (ACCU-CHEK AVIVA PLUS) test  strip Test 1x a day- dx 250.02  . Melatonin 1 MG TABS Take by mouth. At bedtime   . metFORMIN (GLUCOPHAGE) 1000 MG tablet TAKE 1 TABLET BY MOUTH  TWICE DAILY WITH MEALS  . Multiple Vitamin (MULTIVITAMIN) capsule Take 1 capsule by mouth daily.    . rosuvastatin (CRESTOR) 20 MG tablet Take 1 tablet (20 mg total) by mouth daily.  . sitaGLIPtin (JANUVIA) 100 MG tablet Take 1 tablet (100 mg total) by mouth daily.     Past Surgical History:  Procedure Laterality Date  . APPENDECTOMY    . COLONOSCOPY    . HERNIA REPAIR    . KNEE SURGERY Bilateral 11 03 2014   Dr. Tonita Cong    Family History  Problem Relation Age of Onset  . Hyperlipidemia Mother   . Heart disease Father   . Cancer Father        bladder, and prostate  . Stroke Father   . Coronary artery disease Father   . Prostate cancer Father   . Colon cancer Father   . Hyperlipidemia Sister   . Hypertension Sister   . Prostate cancer Other   . Diabetes Other   . Heart disease Other   . Esophageal cancer Neg Hx   . Rectal cancer Neg Hx   . Stomach cancer Neg Hx  New complaints: Left hip pain- started several month sago but has gotten worse. Riding in car makes it worse. Walking and standing help pain. Rate pain 6/10 when sitting.  Social history: Live with wife. Is retired and helps keep his granddaughter daily.  Controlled substance contract: n/a    Review of Systems  Constitutional: Negative for diaphoresis.  Eyes: Negative for pain.  Respiratory: Negative for shortness of breath.   Cardiovascular: Negative for chest pain, palpitations and leg swelling.  Gastrointestinal: Negative for abdominal pain.  Endocrine: Negative for polydipsia.  Skin: Negative for rash.  Neurological: Negative for dizziness, weakness and headaches.  Hematological: Does not bruise/bleed easily.  All other systems reviewed and are negative.      Objective:   Physical Exam Vitals and nursing note reviewed.  Constitutional:       Appearance: Normal appearance. He is well-developed.  HENT:     Head: Normocephalic.     Nose: Nose normal.  Eyes:     Pupils: Pupils are equal, round, and reactive to light.  Neck:     Thyroid: No thyroid mass or thyromegaly.     Vascular: No carotid bruit or JVD.     Trachea: Phonation normal.  Cardiovascular:     Rate and Rhythm: Normal rate and regular rhythm.  Pulmonary:     Effort: Pulmonary effort is normal. No respiratory distress.     Breath sounds: Normal breath sounds.  Abdominal:     General: Bowel sounds are normal.     Palpations: Abdomen is soft.     Tenderness: There is no abdominal tenderness.  Musculoskeletal:        General: Normal range of motion.     Cervical back: Normal range of motion and neck supple.  Lymphadenopathy:     Cervical: No cervical adenopathy.  Skin:    General: Skin is warm and dry.  Neurological:     Mental Status: He is alert and oriented to person, place, and time.  Psychiatric:        Behavior: Behavior normal.        Thought Content: Thought content normal.        Judgment: Judgment normal.    BP 122/74   Pulse 71   Temp 98.4 F (36.9 C) (Temporal)   Resp 20   Ht '5\' 7"'$  (1.702 m)   Wt 160 lb (72.6 kg)   SpO2 99%   BMI 25.06 kg/m   Left hip xray normal-Preliminary reading by Ronnald Collum, FNP  [facility]  HGBA1c 6.6     Assessment & Plan:  Rodney Glover comes in today with chief complaint of Medical Management of Chronic Issues   Diagnosis and orders addressed:  1. Essential hypertension Low sodium diet - CBC with Differential/Platelet - CMP14+EGFR  2. Mixed hyperlipidemia Low fat diet - Lipid panel  3. Type 2 diabetes mellitus without complication, unspecified whether long term insulin use (HCC) Continue to watch carbs in diet Continue weekly exercise with golf - Bayer DCA Hb A1c Waived - Microalbumin / creatinine urine ratio  4. BMI 28.0-28.9,adult Discussed diet and exercise for person with BMI >25 Will  recheck weight in 3-6 months   5. Left hip pain - DG HIP UNILAT W OR W/O PELVIS 2-3 VIEWS LEFT  6. Sciatica of left side Stretching exercises - predniSONE (DELTASONE) 20 MG tablet; 2 po at sametime daily for 5 days  Dispense: 10 tablet; Refill: 0   Labs pending Health Maintenance reviewed Diet and exercise encouraged  Follow up plan: 3 months   Mary-Margaret Hassell Done, FNP

## 2019-07-01 NOTE — Addendum Note (Signed)
Addended by: Chevis Pretty on: 07/01/2019 09:05 AM   Modules accepted: Orders

## 2019-07-01 NOTE — Patient Instructions (Signed)
Sciatica  Sciatica is pain, weakness, tingling, or loss of feeling (numbness) along the sciatic nerve. The sciatic nerve starts in the lower back and goes down the back of each leg. Sciatica usually goes away on its own or with treatment. Sometimes, sciatica may come back (recur). What are the causes? This condition happens when the sciatic nerve is pinched or has pressure put on it. This may be the result of:  A disk in between the bones of the spine bulging out too far (herniated disk).  Changes in the spinal disks that occur with aging.  A condition that affects a muscle in the butt.  Extra bone growth near the sciatic nerve.  A break (fracture) of the area between your hip bones (pelvis).  Pregnancy.  Tumor. This is rare. What increases the risk? You are more likely to develop this condition if you:  Play sports that put pressure or stress on the spine.  Have poor strength and ease of movement (flexibility).  Have had a back injury in the past.  Have had back surgery.  Sit for long periods of time.  Do activities that involve bending or lifting over and over again.  Are very overweight (obese). What are the signs or symptoms? Symptoms can vary from mild to very bad. They may include:  Any of these problems in the lower back, leg, hip, or butt: ? Mild tingling, loss of feeling, or dull aches. ? Burning sensations. ? Sharp pains.  Loss of feeling in the back of the calf or the sole of the foot.  Leg weakness.  Very bad back pain that makes it hard to move. These symptoms may get worse when you cough, sneeze, or laugh. They may also get worse when you sit or stand for long periods of time. How is this treated? This condition often gets better without any treatment. However, treatment may include:  Changing or cutting back on physical activity when you have pain.  Doing exercises and stretching.  Putting ice or heat on the affected area.  Medicines that  help: ? To relieve pain and swelling. ? To relax your muscles.  Shots (injections) of medicines that help to relieve pain, irritation, and swelling.  Surgery. Follow these instructions at home: Medicines  Take over-the-counter and prescription medicines only as told by your doctor.  Ask your doctor if the medicine prescribed to you: ? Requires you to avoid driving or using heavy machinery. ? Can cause trouble pooping (constipation). You may need to take these steps to prevent or treat trouble pooping:  Drink enough fluids to keep your pee (urine) pale yellow.  Take over-the-counter or prescription medicines.  Eat foods that are high in fiber. These include beans, whole grains, and fresh fruits and vegetables.  Limit foods that are high in fat and sugar. These include fried or sweet foods. Managing pain      If told, put ice on the affected area. ? Put ice in a plastic bag. ? Place a towel between your skin and the bag. ? Leave the ice on for 20 minutes, 2-3 times a day.  If told, put heat on the affected area. Use the heat source that your doctor tells you to use, such as a moist heat pack or a heating pad. ? Place a towel between your skin and the heat source. ? Leave the heat on for 20-30 minutes. ? Remove the heat if your skin turns bright red. This is very important if you are   unable to feel pain, heat, or cold. You may have a greater risk of getting burned. Activity   Return to your normal activities as told by your doctor. Ask your doctor what activities are safe for you.  Avoid activities that make your symptoms worse.  Take short rests during the day. ? When you rest for a long time, do some physical activity or stretching between periods of rest. ? Avoid sitting for a long time without moving. Get up and move around at least one time each hour.  Exercise and stretch regularly, as told by your doctor.  Do not lift anything that is heavier than 10 lb (4.5 kg)  while you have symptoms of sciatica. ? Avoid lifting heavy things even when you do not have symptoms. ? Avoid lifting heavy things over and over.  When you lift objects, always lift in a way that is safe for your body. To do this, you should: ? Bend your knees. ? Keep the object close to your body. ? Avoid twisting. General instructions  Stay at a healthy weight.  Wear comfortable shoes that support your feet. Avoid wearing high heels.  Avoid sleeping on a mattress that is too soft or too hard. You might have less pain if you sleep on a mattress that is firm enough to support your back.  Keep all follow-up visits as told by your doctor. This is important. Contact a doctor if:  You have pain that: ? Wakes you up when you are sleeping. ? Gets worse when you lie down. ? Is worse than the pain you have had in the past. ? Lasts longer than 4 weeks.  You lose weight without trying. Get help right away if:  You cannot control when you pee (urinate) or poop (have a bowel movement).  You have weakness in any of these areas and it gets worse: ? Lower back. ? The area between your hip bones. ? Butt. ? Legs.  You have redness or swelling of your back.  You have a burning feeling when you pee. Summary  Sciatica is pain, weakness, tingling, or loss of feeling (numbness) along the sciatic nerve.  This condition happens when the sciatic nerve is pinched or has pressure put on it.  Sciatica can cause pain, tingling, or loss of feeling (numbness) in the lower back, legs, hips, and butt.  Treatment often includes rest, exercise, medicines, and putting ice or heat on the affected area. This information is not intended to replace advice given to you by your health care provider. Make sure you discuss any questions you have with your health care provider. Document Revised: 02/09/2018 Document Reviewed: 02/09/2018 Elsevier Patient Education  2020 Elsevier Inc.  

## 2019-07-02 LAB — CMP14+EGFR
ALT: 22 IU/L (ref 0–44)
AST: 27 IU/L (ref 0–40)
Albumin/Globulin Ratio: 2.1 (ref 1.2–2.2)
Albumin: 4.6 g/dL (ref 3.8–4.8)
Alkaline Phosphatase: 64 IU/L (ref 48–121)
BUN/Creatinine Ratio: 14 (ref 10–24)
BUN: 11 mg/dL (ref 8–27)
Bilirubin Total: 0.4 mg/dL (ref 0.0–1.2)
CO2: 23 mmol/L (ref 20–29)
Calcium: 9 mg/dL (ref 8.6–10.2)
Chloride: 100 mmol/L (ref 96–106)
Creatinine, Ser: 0.81 mg/dL (ref 0.76–1.27)
GFR calc Af Amer: 106 mL/min/{1.73_m2} (ref >59)
GFR calc non Af Amer: 91 mL/min/{1.73_m2} (ref >59)
Globulin, Total: 2.2 g/dL (ref 1.5–4.5)
Glucose: 115 mg/dL — ABNORMAL HIGH (ref 65–99)
Potassium: 4.6 mmol/L (ref 3.5–5.2)
Sodium: 141 mmol/L (ref 134–144)
Total Protein: 6.8 g/dL (ref 6.0–8.5)

## 2019-07-02 LAB — CBC WITH DIFFERENTIAL/PLATELET
Basophils Absolute: 0.1 10*3/uL (ref 0.0–0.2)
Basos: 1 %
EOS (ABSOLUTE): 0.3 10*3/uL (ref 0.0–0.4)
Eos: 5 %
Hematocrit: 43.7 % (ref 37.5–51.0)
Hemoglobin: 14.7 g/dL (ref 13.0–17.7)
Immature Grans (Abs): 0 10*3/uL (ref 0.0–0.1)
Immature Granulocytes: 0 %
Lymphocytes Absolute: 1.9 10*3/uL (ref 0.7–3.1)
Lymphs: 33 %
MCH: 31.3 pg (ref 26.6–33.0)
MCHC: 33.6 g/dL (ref 31.5–35.7)
MCV: 93 fL (ref 79–97)
Monocytes Absolute: 0.5 10*3/uL (ref 0.1–0.9)
Monocytes: 8 %
Neutrophils Absolute: 3.1 10*3/uL (ref 1.4–7.0)
Neutrophils: 53 %
Platelets: 213 10*3/uL (ref 150–450)
RBC: 4.7 x10E6/uL (ref 4.14–5.80)
RDW: 12.6 % (ref 11.6–15.4)
WBC: 5.8 10*3/uL (ref 3.4–10.8)

## 2019-07-02 LAB — MICROALBUMIN / CREATININE URINE RATIO
Creatinine, Urine: 140.7 mg/dL
Microalb/Creat Ratio: 6 mg/g creat (ref 0–29)
Microalbumin, Urine: 8.9 ug/mL

## 2019-07-02 LAB — LIPID PANEL
Chol/HDL Ratio: 2.7 ratio (ref 0.0–5.0)
Cholesterol, Total: 128 mg/dL (ref 100–199)
HDL: 48 mg/dL (ref >39)
LDL Chol Calc (NIH): 67 mg/dL (ref 0–99)
Triglycerides: 60 mg/dL (ref 0–149)
VLDL Cholesterol Cal: 13 mg/dL (ref 5–40)

## 2019-08-07 ENCOUNTER — Encounter: Payer: Self-pay | Admitting: Nurse Practitioner

## 2019-10-13 ENCOUNTER — Other Ambulatory Visit: Payer: Self-pay

## 2019-10-13 ENCOUNTER — Ambulatory Visit: Payer: Medicare PPO | Admitting: Nurse Practitioner

## 2019-10-13 ENCOUNTER — Encounter: Payer: Self-pay | Admitting: Nurse Practitioner

## 2019-10-13 VITALS — BP 120/70 | HR 89 | Temp 97.8°F | Resp 20 | Ht 67.0 in | Wt 159.0 lb

## 2019-10-13 DIAGNOSIS — Z6828 Body mass index (BMI) 28.0-28.9, adult: Secondary | ICD-10-CM | POA: Diagnosis not present

## 2019-10-13 DIAGNOSIS — I10 Essential (primary) hypertension: Secondary | ICD-10-CM | POA: Diagnosis not present

## 2019-10-13 DIAGNOSIS — E782 Mixed hyperlipidemia: Secondary | ICD-10-CM | POA: Diagnosis not present

## 2019-10-13 DIAGNOSIS — E119 Type 2 diabetes mellitus without complications: Secondary | ICD-10-CM | POA: Diagnosis not present

## 2019-10-13 LAB — BAYER DCA HB A1C WAIVED: HB A1C (BAYER DCA - WAIVED): 6.4 % (ref <7.0)

## 2019-10-13 MED ORDER — AMLODIPINE BESY-BENAZEPRIL HCL 5-20 MG PO CAPS: 1.0000 | ORAL_CAPSULE | Freq: Every day | ORAL | 1 refills | Status: DC

## 2019-10-13 MED ORDER — GLIMEPIRIDE 4 MG PO TABS: 4.0000 mg | ORAL_TABLET | Freq: Every day | ORAL | 1 refills | Status: DC

## 2019-10-13 MED ORDER — METFORMIN HCL 1000 MG PO TABS: ORAL_TABLET | ORAL | 1 refills | Status: DC

## 2019-10-13 MED ORDER — EZETIMIBE 10 MG PO TABS: 10.0000 mg | ORAL_TABLET | Freq: Every day | ORAL | 1 refills | Status: DC

## 2019-10-13 MED ORDER — ROSUVASTATIN CALCIUM 20 MG PO TABS: 20.0000 mg | ORAL_TABLET | Freq: Every day | ORAL | 1 refills | Status: DC

## 2019-10-13 MED ORDER — SITAGLIPTIN PHOSPHATE 100 MG PO TABS: 100.0000 mg | ORAL_TABLET | Freq: Every day | ORAL | 1 refills | Status: DC

## 2019-10-13 NOTE — Progress Notes (Signed)
Subjective:    Patient ID: Rodney Glover, male    DOB: [birth year], 69 y.o.   MRN: [id number]   Chief Complaint: medical management of chronic issues     HPI:  1. Essential hypertension Does not check BP at home. Denies chest pain, sob, headaches, dizziness. BP Readings from Last 3 Encounters:  07/01/19 122/74  03/22/19 (!) 143/77  12/10/18 138/74    2. Mixed hyperlipidemia Takes zetia and crestor and doing well. Does try to watch fat and cholesterol intake. Stays active playing golf. Lab Results  Component Value Date   CHOL 128 07/01/2019   HDL 48 07/01/2019   LDLCALC 67 07/01/2019   TRIG 60 07/01/2019   CHOLHDL 2.7 07/01/2019    3. Type 2 diabetes mellitus without complication, unspecified whether long term insulin use ([place]) Does check blood sugars at home. Ranges from 110s-120s. Watches carbohydrate intake in diet. Denies blurry vision, numbness/tingling in feet, symptoms of hypoglycemia. Lab Results  Component Value Date   HGBA1C 6.6 07/01/2019    4. BMI 28.0-28.9,adult No significant changes in weight. Has lost 1 pound since last visit. Wt Readings from Last 3 Encounters:  10/13/19 159 lb (72.1 kg)  07/01/19 160 lb (72.6 kg)  03/22/19 164 lb (74.4 kg)   BMI Readings from Last 3 Encounters:  10/13/19 24.90 kg/m  07/01/19 25.06 kg/m  03/22/19 25.69 kg/m     Outpatient Encounter Medications as of 10/13/2019  Medication Sig  . amLODipine-benazepril (LOTREL) 5-20 MG capsule Take 1 capsule by mouth daily.  Marland Kitchen aspirin 81 MG tablet Take 81 mg by mouth daily.    . Blood Glucose Monitoring Suppl (ACCU-CHEK AVIVA PLUS) w/Device KIT Use to check blood sugar daily  . Cholecalciferol (VITAMIN D-3) 5000 UNITS TABS Take 10,000 each by mouth daily.  . Cinnamon 500 MG TABS Take by mouth 2 (two) times daily.    Marland Kitchen ezetimibe (ZETIA) 10 MG tablet Take 1 tablet (10 mg total) by mouth daily.  . fish oil-omega-3 fatty acids 1000 MG capsule Take 2 g by mouth daily. 4 per day  1028m per day  . glimepiride (AMARYL) 4 MG tablet Take 1 tablet (4 mg total) by mouth daily with breakfast.  . glucose blood (ACCU-CHEK AVIVA PLUS) test strip Test 1x a day- dx 250.02  . Melatonin 1 MG TABS Take by mouth. At bedtime   . metFORMIN (GLUCOPHAGE) 1000 MG tablet TAKE 1 TABLET BY MOUTH  TWICE DAILY WITH MEALS  . Multiple Vitamin (MULTIVITAMIN) capsule Take 1 capsule by mouth daily.    . predniSONE (DELTASONE) 20 MG tablet 2 po at sametime daily for 5 days  . rosuvastatin (CRESTOR) 20 MG tablet Take 1 tablet (20 mg total) by mouth daily.  . sitaGLIPtin (JANUVIA) 100 MG tablet Take 1 tablet (100 mg total) by mouth daily.   No facility-administered encounter medications on file as of 10/13/2019.    Past Surgical History:  Procedure Laterality Date  . APPENDECTOMY    . COLONOSCOPY    . HERNIA REPAIR    . KNEE SURGERY Bilateral 11 03 2014   Dr. BTonita Cong   Family History  Problem Relation Age of Onset  . Hyperlipidemia Mother   . Heart disease Father   . Cancer Father        bladder, and prostate  . Stroke Father   . Coronary artery disease Father   . Prostate cancer Father   . Colon cancer Father   . Hyperlipidemia Sister   .  Hypertension Sister   . Prostate cancer Other   . Diabetes Other   . Heart disease Other   . Esophageal cancer Neg Hx   . Rectal cancer Neg Hx   . Stomach cancer Neg Hx     New complaints: None  Social history: Is retired. Lives with his wife.  Controlled substance contract: N/A    Review of Systems  Constitutional: Negative.   HENT: Negative.   Eyes: Negative.   Respiratory: Negative.   Cardiovascular: Negative.   Gastrointestinal: Negative.   Genitourinary: Negative.   Musculoskeletal: Negative.   Skin: Negative.   Neurological: Negative.   Psychiatric/Behavioral: Negative.        Objective:   Physical Exam Vitals and nursing note reviewed.  Constitutional:      Appearance: He is normal weight.  HENT:     Head:  Normocephalic.     Right Ear: Tympanic membrane normal.     Left Ear: Tympanic membrane normal.     Nose: Nose normal.     Mouth/Throat:     Mouth: Mucous membranes are moist.     Pharynx: Oropharynx is clear.  Eyes:     Conjunctiva/sclera: Conjunctivae normal.  Cardiovascular:     Rate and Rhythm: Normal rate and regular rhythm.     Pulses: Normal pulses.     Heart sounds: Normal heart sounds.  Pulmonary:     Effort: Pulmonary effort is normal.     Breath sounds: Normal breath sounds.  Abdominal:     General: Bowel sounds are normal.     Palpations: Abdomen is soft.  Musculoskeletal:        General: Normal range of motion.     Cervical back: Normal range of motion.  Skin:    General: Skin is warm and dry.  Neurological:     General: No focal deficit present.     Mental Status: He is alert and oriented to person, place, and time.  Psychiatric:        Mood and Affect: Mood normal.        Behavior: Behavior normal.     BP 120/70   Pulse 89   Temp 97.8 F (36.6 C) (Temporal)   Resp 20   Ht '5\' 7"'  (1.702 m)   Wt 159 lb (72.1 kg)   SpO2 96%   BMI 24.90 kg/m      Assessment & Plan:  Rodney Glover comes in today with chief complaint of Medical Management of Chronic Issues   Diagnosis and orders addressed:  1. Essential hypertension Low sodium diet - CBC with Differential/Platelet - CMP14+EGFR  2. Mixed hyperlipidemia Low fat/low cholesterol diet Exercise daily as tolerated - Lipid panel  3. Type 2 diabetes mellitus without complication, unspecified whether long term insulin use (HCC) Watch carbohydrate intake Check blood sugars daily - Bayer DCA Hb A1c Waived  4. BMI 28.0-28.9,adult Discussed diet and exercise for person with BMI >25 Will recheck weight in 3-6 months   Labs pending Health Maintenance reviewed Diet and exercise encouraged  Follow up plan: 3 months   Mary-Margaret Hassell Done, FNP

## 2019-10-13 NOTE — Patient Instructions (Signed)
Diabetes Mellitus and Foot Care Foot care is an important part of your health, especially when you have diabetes. Diabetes may cause you to have problems because of poor blood flow (circulation) to your feet and legs, which can cause your skin to:  Become thinner and drier.  Break more easily.  Heal more slowly.  Peel and crack. You may also have nerve damage (neuropathy) in your legs and feet, causing decreased feeling in them. This means that you may not notice minor injuries to your feet that could lead to more serious problems. Noticing and addressing any potential problems early is the best way to prevent future foot problems. How to care for your feet Foot hygiene  Wash your feet daily with warm water and mild soap. Do not use hot water. Then, pat your feet and the areas between your toes until they are completely dry. Do not soak your feet as this can dry your skin.  Trim your toenails straight across. Do not dig under them or around the cuticle. File the edges of your nails with an emery board or nail file.  Apply a moisturizing lotion or petroleum jelly to the skin on your feet and to dry, brittle toenails. Use lotion that does not contain alcohol and is unscented. Do not apply lotion between your toes. Shoes and socks  Wear clean socks or stockings every day. Make sure they are not too tight. Do not wear knee-high stockings since they may decrease blood flow to your legs.  Wear shoes that fit properly and have enough cushioning. Always look in your shoes before you put them on to be sure there are no objects inside.  To break in new shoes, wear them for just a few hours a day. This prevents injuries on your feet. Wounds, scrapes, corns, and calluses  Check your feet daily for blisters, cuts, bruises, sores, and redness. If you cannot see the bottom of your feet, use a mirror or ask someone for help.  Do not cut corns or calluses or try to remove them with medicine.  If you  find a minor scrape, cut, or break in the skin on your feet, keep it and the skin around it clean and dry. You may clean these areas with mild soap and water. Do not clean the area with peroxide, alcohol, or iodine.  If you have a wound, scrape, corn, or callus on your foot, look at it several times a day to make sure it is healing and not infected. Check for: ? Redness, swelling, or pain. ? Fluid or blood. ? Warmth. ? Pus or a bad smell. General instructions  Do not cross your legs. This may decrease blood flow to your feet.  Do not use heating pads or hot water bottles on your feet. They may burn your skin. If you have lost feeling in your feet or legs, you may not know this is happening until it is too late.  Protect your feet from hot and cold by wearing shoes, such as at the beach or on hot pavement.  Schedule a complete foot exam at least once a year (annually) or more often if you have foot problems. If you have foot problems, report any cuts, sores, or bruises to your health care provider immediately. Contact a health care provider if:  You have a medical condition that increases your risk of infection and you have any cuts, sores, or bruises on your feet.  You have an injury that is not   healing.  You have redness on your legs or feet.  You feel burning or tingling in your legs or feet.  You have pain or cramps in your legs and feet.  Your legs or feet are numb.  Your feet always feel cold.  You have pain around a toenail. Get help right away if:  You have a wound, scrape, corn, or callus on your foot and: ? You have pain, swelling, or redness that gets worse. ? You have fluid or blood coming from the wound, scrape, corn, or callus. ? Your wound, scrape, corn, or callus feels warm to the touch. ? You have pus or a bad smell coming from the wound, scrape, corn, or callus. ? You have a fever. ? You have a red line going up your leg. Summary  Check your feet every day  for cuts, sores, red spots, swelling, and blisters.  Moisturize feet and legs daily.  Wear shoes that fit properly and have enough cushioning.  If you have foot problems, report any cuts, sores, or bruises to your health care provider immediately.  Schedule a complete foot exam at least once a year (annually) or more often if you have foot problems. This information is not intended to replace advice given to you by your health care provider. Make sure you discuss any questions you have with your health care provider. Document Revised: 10/14/2018 Document Reviewed: 02/23/2016 Elsevier Patient Education  2020 Elsevier Inc.  

## 2019-10-14 ENCOUNTER — Ambulatory Visit: Payer: Self-pay | Admitting: Nurse Practitioner

## 2019-10-14 LAB — CMP14+EGFR
ALT: 27 IU/L (ref 0–44)
AST: 31 IU/L (ref 0–40)
Albumin/Globulin Ratio: 1.9 (ref 1.2–2.2)
Albumin: 4.5 g/dL (ref 3.8–4.8)
Alkaline Phosphatase: 65 IU/L (ref 48–121)
BUN/Creatinine Ratio: 11 (ref 10–24)
BUN: 8 mg/dL (ref 8–27)
Bilirubin Total: 0.4 mg/dL (ref 0.0–1.2)
CO2: 25 mmol/L (ref 20–29)
Calcium: 9 mg/dL (ref 8.6–10.2)
Chloride: 102 mmol/L (ref 96–106)
Creatinine, Ser: 0.72 mg/dL — ABNORMAL LOW (ref 0.76–1.27)
GFR calc Af Amer: 111 mL/min/{1.73_m2} (ref >59)
GFR calc non Af Amer: 96 mL/min/{1.73_m2} (ref >59)
Globulin, Total: 2.4 g/dL (ref 1.5–4.5)
Glucose: 126 mg/dL — ABNORMAL HIGH (ref 65–99)
Potassium: 4.6 mmol/L (ref 3.5–5.2)
Sodium: 141 mmol/L (ref 134–144)
Total Protein: 6.9 g/dL (ref 6.0–8.5)

## 2019-10-14 LAB — CBC WITH DIFFERENTIAL/PLATELET
Basophils Absolute: 0.1 10*3/uL (ref 0.0–0.2)
Basos: 1 %
EOS (ABSOLUTE): 0.8 10*3/uL — ABNORMAL HIGH (ref 0.0–0.4)
Eos: 11 %
Hematocrit: 45.2 % (ref 37.5–51.0)
Hemoglobin: 15.4 g/dL (ref 13.0–17.7)
Immature Grans (Abs): 0 10*3/uL (ref 0.0–0.1)
Immature Granulocytes: 0 %
Lymphocytes Absolute: 2 10*3/uL (ref 0.7–3.1)
Lymphs: 28 %
MCH: 32 pg (ref 26.6–33.0)
MCHC: 34.1 g/dL (ref 31.5–35.7)
MCV: 94 fL (ref 79–97)
Monocytes Absolute: 0.5 10*3/uL (ref 0.1–0.9)
Monocytes: 7 %
Neutrophils Absolute: 3.7 10*3/uL (ref 1.4–7.0)
Neutrophils: 53 %
Platelets: 212 10*3/uL (ref 150–450)
RBC: 4.82 x10E6/uL (ref 4.14–5.80)
RDW: 12.2 % (ref 11.6–15.4)
WBC: 7 10*3/uL (ref 3.4–10.8)

## 2019-10-14 LAB — LIPID PANEL
Chol/HDL Ratio: 2.6 ratio (ref 0.0–5.0)
Cholesterol, Total: 131 mg/dL (ref 100–199)
HDL: 50 mg/dL (ref >39)
LDL Chol Calc (NIH): 64 mg/dL (ref 0–99)
Triglycerides: 89 mg/dL (ref 0–149)
VLDL Cholesterol Cal: 17 mg/dL (ref 5–40)

## 2019-11-24 ENCOUNTER — Other Ambulatory Visit: Payer: Self-pay | Admitting: Nurse Practitioner

## 2019-11-24 DIAGNOSIS — I10 Essential (primary) hypertension: Secondary | ICD-10-CM

## 2019-12-01 ENCOUNTER — Other Ambulatory Visit: Payer: Self-pay | Admitting: Nurse Practitioner

## 2019-12-01 DIAGNOSIS — E782 Mixed hyperlipidemia: Secondary | ICD-10-CM

## 2019-12-05 ENCOUNTER — Other Ambulatory Visit: Payer: Self-pay | Admitting: Nurse Practitioner

## 2019-12-05 DIAGNOSIS — E119 Type 2 diabetes mellitus without complications: Secondary | ICD-10-CM

## 2019-12-14 DIAGNOSIS — L570 Actinic keratosis: Secondary | ICD-10-CM | POA: Diagnosis not present

## 2020-01-25 ENCOUNTER — Other Ambulatory Visit: Payer: Self-pay

## 2020-01-25 ENCOUNTER — Ambulatory Visit: Payer: Medicare PPO | Admitting: Nurse Practitioner

## 2020-01-25 ENCOUNTER — Encounter: Payer: Self-pay | Admitting: Nurse Practitioner

## 2020-01-25 VITALS — BP 132/84 | HR 67 | Temp 98.4°F | Resp 20 | Ht 67.0 in | Wt 159.0 lb

## 2020-01-25 DIAGNOSIS — E782 Mixed hyperlipidemia: Secondary | ICD-10-CM

## 2020-01-25 DIAGNOSIS — Z6828 Body mass index (BMI) 28.0-28.9, adult: Secondary | ICD-10-CM | POA: Diagnosis not present

## 2020-01-25 DIAGNOSIS — I10 Essential (primary) hypertension: Secondary | ICD-10-CM

## 2020-01-25 DIAGNOSIS — Z125 Encounter for screening for malignant neoplasm of prostate: Secondary | ICD-10-CM

## 2020-01-25 DIAGNOSIS — E119 Type 2 diabetes mellitus without complications: Secondary | ICD-10-CM | POA: Diagnosis not present

## 2020-01-25 LAB — BAYER DCA HB A1C WAIVED: HB A1C (BAYER DCA - WAIVED): 6.9 % (ref <7.0)

## 2020-01-25 NOTE — Patient Instructions (Signed)
Exercising to Stay Healthy To become healthy and stay healthy, it is recommended that you do moderate-intensity and vigorous-intensity exercise. You can tell that you are exercising at a moderate intensity if your heart starts beating faster and you start breathing faster but can still hold a conversation. You can tell that you are exercising at a vigorous intensity if you are breathing much harder and faster and cannot hold a conversation while exercising. Exercising regularly is important. It has many health benefits, such as:  Improving overall fitness, flexibility, and endurance.  Increasing bone density.  Helping with weight control.  Decreasing body fat.  Increasing muscle strength.  Reducing stress and tension.  Improving overall health. How often should I exercise? Choose an activity that you enjoy, and set realistic goals. Your health care provider can help you make an activity plan that works for you. Exercise regularly as told by your health care provider. This may include:  Doing strength training two times a week, such as: ? Lifting weights. ? Using resistance bands. ? Push-ups. ? Sit-ups. ? Yoga.  Doing a certain intensity of exercise for a given amount of time. Choose from these options: ? A total of 150 minutes of moderate-intensity exercise every week. ? A total of 75 minutes of vigorous-intensity exercise every week. ? A mix of moderate-intensity and vigorous-intensity exercise every week. Children, pregnant women, people who have not exercised regularly, people who are overweight, and older adults may need to talk with a health care provider about what activities are safe to do. If you have a medical condition, be sure to talk with your health care provider before you start a new exercise program. What are some exercise ideas? Moderate-intensity exercise ideas include:  Walking 1 mile (1.6 km) in about 15  minutes.  Biking.  Hiking.  Golfing.  Dancing.  Water aerobics. Vigorous-intensity exercise ideas include:  Walking 4.5 miles (7.2 km) or more in about 1 hour.  Jogging or running 5 miles (8 km) in about 1 hour.  Biking 10 miles (16.1 km) or more in about 1 hour.  Lap swimming.  Roller-skating or in-line skating.  Cross-country skiing.  Vigorous competitive sports, such as football, basketball, and soccer.  Jumping rope.  Aerobic dancing. What are some everyday activities that can help me to get exercise?  Yard work, such as: ? Pushing a lawn mower. ? Raking and bagging leaves.  Washing your car.  Pushing a stroller.  Shoveling snow.  Gardening.  Washing windows or floors. How can I be more active in my day-to-day activities?  Use stairs instead of an elevator.  Take a walk during your lunch break.  If you drive, park your car farther away from your work or school.  If you take public transportation, get off one stop early and walk the rest of the way.  Stand up or walk around during all of your indoor phone calls.  Get up, stretch, and walk around every 30 minutes throughout the day.  Enjoy exercise with a friend. Support to continue exercising will help you keep a regular routine of activity. What guidelines can I follow while exercising?  Before you start a new exercise program, talk with your health care provider.  Do not exercise so much that you hurt yourself, feel dizzy, or get very short of breath.  Wear comfortable clothes and wear shoes with good support.  Drink plenty of water while you exercise to prevent dehydration or heat stroke.  Work out until your breathing   and your heartbeat get faster. Where to find more information  [place] Department of Health and Human Services: www.hhs.gov  Centers for Disease Control and Prevention (CDC): www.cdc.gov Summary  Exercising regularly is important. It will improve your overall fitness,  flexibility, and endurance.  Regular exercise also will improve your overall health. It can help you control your weight, reduce stress, and improve your bone density.  Do not exercise so much that you hurt yourself, feel dizzy, or get very short of breath.  Before you start a new exercise program, talk with your health care provider. This information is not intended to replace advice given to you by your health care provider. Make sure you discuss any questions you have with your health care provider. Document Revised: 01/03/2017 Document Reviewed: 12/12/2016 Elsevier Patient Education  2020 [employer] Inc.  

## 2020-01-25 NOTE — Progress Notes (Signed)
Subjective:    Patient ID: Rodney Glover, male    DOB: [birth year], 69 y.o.   MRN: [id number]   Chief Complaint: Medical Management of Chronic Issues    HPI:  1. Primary hypertension No c/o chest pain, sob or headache. Does not check blood pressure at home. BP Readings from Last 3 Encounters:  10/13/19 120/70  07/01/19 122/74  03/22/19 (!) 143/77     2. Mixed hyperlipidemia Does watch diet and stays very active. Lab Results  Component Value Date   CHOL 131 10/13/2019   HDL 50 10/13/2019   LDLCALC 64 10/13/2019   TRIG 89 10/13/2019   CHOLHDL 2.6 10/13/2019     3. Type 2 diabetes mellitus without complication, unspecified whether long term insulin use (HCC) Fasting blood sugars are running around 110-140. He denies any low blood sugars. He does watch diet. Lab Results  Component Value Date   HGBA1C 6.4 10/13/2019     4. Screening for prostate cancer Lab Results  Component Value Date   PSA1 0.4 09/04/2017   PSA 0.4 05/20/2013      5. BMI 28.0-28.9,adult No recent weight changes Wt Readings from Last 3 Encounters:  01/25/20 159 lb (72.1 kg)  10/13/19 159 lb (72.1 kg)  07/01/19 160 lb (72.6 kg)   BMI Readings from Last 3 Encounters:  01/25/20 24.90 kg/m  10/13/19 24.90 kg/m  07/01/19 25.06 kg/m       Outpatient Encounter Medications as of 01/25/2020  Medication Sig  . amLODipine-benazepril (LOTREL) 5-20 MG capsule Take 1 capsule by mouth daily.  Marland Kitchen aspirin 81 MG tablet Take 81 mg by mouth daily.    . Blood Glucose Monitoring Suppl (ACCU-CHEK AVIVA PLUS) w/Device KIT Use to check blood sugar daily  . Cholecalciferol (VITAMIN D-3) 5000 UNITS TABS Take 10,000 each by mouth daily.  . Cinnamon 500 MG TABS Take by mouth 2 (two) times daily.    Marland Kitchen ezetimibe (ZETIA) 10 MG tablet TAKE 1 TABLET (10 MG TOTAL) BY MOUTH DAILY.  . fish oil-omega-3 fatty acids 1000 MG capsule Take 2 g by mouth daily. 4 per day 1070m per day  . glimepiride (AMARYL) 4 MG  tablet Take 1 tablet (4 mg total) by mouth daily with breakfast.  . glucose blood (ACCU-CHEK AVIVA PLUS) test strip Test 1x a day- dx 250.02  . metFORMIN (GLUCOPHAGE) 1000 MG tablet TAKE 1 TABLET TWICE DAILY WITH MEALS  . Multiple Vitamin (MULTIVITAMIN) capsule Take 1 capsule by mouth daily.    . rosuvastatin (CRESTOR) 20 MG tablet Take 1 tablet (20 mg total) by mouth daily.  . sitaGLIPtin (JANUVIA) 100 MG tablet Take 1 tablet (100 mg total) by mouth daily.   No facility-administered encounter medications on file as of 01/25/2020.    Past Surgical History:  Procedure Laterality Date  . APPENDECTOMY    . COLONOSCOPY    . HERNIA REPAIR    . KNEE SURGERY Bilateral 11 03 2014   Dr. BTonita Cong   Family History  Problem Relation Age of Onset  . Hyperlipidemia Mother   . Heart disease Father   . Cancer Father        bladder, and prostate  . Stroke Father   . Coronary artery disease Father   . Prostate cancer Father   . Colon cancer Father   . Hyperlipidemia Sister   . Hypertension Sister   . Prostate cancer Other   . Diabetes Other   . Heart disease Other   . Esophageal cancer  Neg Hx   . Rectal cancer Neg Hx   . Stomach cancer Neg Hx     New complaints: None today  Social history: Lives with wife. Help take care of granddaughter daily  Controlled substance contract: n/a     Review of Systems  Constitutional: Negative for diaphoresis.  Eyes: Negative for pain.  Respiratory: Negative for shortness of breath.   Cardiovascular: Negative for chest pain, palpitations and leg swelling.  Gastrointestinal: Negative for abdominal pain.  Endocrine: Negative for polydipsia.  Skin: Negative for rash.  Neurological: Negative for dizziness, weakness and headaches.  Hematological: Does not bruise/bleed easily.  All other systems reviewed and are negative.      Objective:   Physical Exam Vitals and nursing note reviewed.  Constitutional:      Appearance: Normal appearance. He  is well-developed and well-nourished.  HENT:     Head: Normocephalic.     Nose: Nose normal.     Mouth/Throat:     Mouth: Oropharynx is clear and moist.  Eyes:     Extraocular Movements: EOM normal.     Pupils: Pupils are equal, round, and reactive to light.  Neck:     Thyroid: No thyroid mass or thyromegaly.     Vascular: No carotid bruit or JVD.     Trachea: Phonation normal.  Cardiovascular:     Rate and Rhythm: Normal rate and regular rhythm.  Pulmonary:     Effort: Pulmonary effort is normal. No respiratory distress.     Breath sounds: Normal breath sounds.  Abdominal:     General: Bowel sounds are normal. Aorta is normal.     Palpations: Abdomen is soft.     Tenderness: There is no abdominal tenderness.  Musculoskeletal:        General: Normal range of motion.     Cervical back: Normal range of motion and neck supple.  Lymphadenopathy:     Cervical: No cervical adenopathy.  Skin:    General: Skin is warm and dry.  Neurological:     Mental Status: He is alert and oriented to person, place, and time.  Psychiatric:        Mood and Affect: Mood and affect normal.        Behavior: Behavior normal.        Thought Content: Thought content normal.        Judgment: Judgment normal.     BP 132/84   Pulse 67   Temp 98.4 F (36.9 C) (Temporal)   Resp 20   Ht '5\' 7"'  (1.702 m)   Wt 159 lb (72.1 kg)   SpO2 98%   BMI 24.90 kg/m   HGBA1c 6.9%    Assessment & Plan:  Rodney Glover comes in today with chief complaint of Medical Management of Chronic Issues   Diagnosis and orders addressed:  1. Primary hypertension Low sodium diet - CBC with Differential/Platelet - CMP14+EGFR  2. Mixed hyperlipidemia Low fat diet - Lipid panel  3. Type 2 diabetes mellitus without complication, unspecified whether long term insulin use ([place]) continue  To watch carbs in diet - Bayer DCA Hb A1c Waived  4. Screening for prostate cancer Labs pending - PSA, total and free  5.  BMI 28.0-28.9,adult Discussed diet and exercise for person with BMI >25 Will recheck weight in 3-6 months    Labs pending Health Maintenance reviewed Diet and exercise encouraged  Follow up plan: 3 months   Mary-Margaret Hassell Done, FNP

## 2020-01-26 LAB — CMP14+EGFR
ALT: 30 IU/L (ref 0–44)
AST: 28 IU/L (ref 0–40)
Albumin/Globulin Ratio: 1.8 (ref 1.2–2.2)
Albumin: 4.2 g/dL (ref 3.8–4.8)
Alkaline Phosphatase: 63 IU/L (ref 44–121)
BUN/Creatinine Ratio: 10 (ref 10–24)
BUN: 8 mg/dL (ref 8–27)
Bilirubin Total: 0.3 mg/dL (ref 0.0–1.2)
CO2: 23 mmol/L (ref 20–29)
Calcium: 9.1 mg/dL (ref 8.6–10.2)
Chloride: 100 mmol/L (ref 96–106)
Creatinine, Ser: 0.84 mg/dL (ref 0.76–1.27)
GFR calc Af Amer: 104 mL/min/{1.73_m2} (ref >59)
GFR calc non Af Amer: 90 mL/min/{1.73_m2} (ref >59)
Globulin, Total: 2.4 g/dL (ref 1.5–4.5)
Glucose: 112 mg/dL — ABNORMAL HIGH (ref 65–99)
Potassium: 4.5 mmol/L (ref 3.5–5.2)
Sodium: 138 mmol/L (ref 134–144)
Total Protein: 6.6 g/dL (ref 6.0–8.5)

## 2020-01-26 LAB — CBC WITH DIFFERENTIAL/PLATELET
Basophils Absolute: 0.1 10*3/uL (ref 0.0–0.2)
Basos: 1 %
EOS (ABSOLUTE): 0.3 10*3/uL (ref 0.0–0.4)
Eos: 6 %
Hematocrit: 42.9 % (ref 37.5–51.0)
Hemoglobin: 14.6 g/dL (ref 13.0–17.7)
Immature Grans (Abs): 0 10*3/uL (ref 0.0–0.1)
Immature Granulocytes: 0 %
Lymphocytes Absolute: 1.9 10*3/uL (ref 0.7–3.1)
Lymphs: 35 %
MCH: 30.9 pg (ref 26.6–33.0)
MCHC: 34 g/dL (ref 31.5–35.7)
MCV: 91 fL (ref 79–97)
Monocytes Absolute: 0.4 10*3/uL (ref 0.1–0.9)
Monocytes: 8 %
Neutrophils Absolute: 2.7 10*3/uL (ref 1.4–7.0)
Neutrophils: 50 %
Platelets: 219 10*3/uL (ref 150–450)
RBC: 4.72 x10E6/uL (ref 4.14–5.80)
RDW: 12.6 % (ref 11.6–15.4)
WBC: 5.5 10*3/uL (ref 3.4–10.8)

## 2020-01-26 LAB — LIPID PANEL
Chol/HDL Ratio: 2.7 ratio (ref 0.0–5.0)
Cholesterol, Total: 126 mg/dL (ref 100–199)
HDL: 47 mg/dL (ref >39)
LDL Chol Calc (NIH): 63 mg/dL (ref 0–99)
Triglycerides: 79 mg/dL (ref 0–149)
VLDL Cholesterol Cal: 16 mg/dL (ref 5–40)

## 2020-01-26 LAB — PSA, TOTAL AND FREE
PSA, Free Pct: 20 %
PSA, Free: 0.1 ng/mL
Prostate Specific Ag, Serum: 0.5 ng/mL (ref 0.0–4.0)

## 2020-02-09 ENCOUNTER — Other Ambulatory Visit: Payer: Self-pay | Admitting: Nurse Practitioner

## 2020-02-09 DIAGNOSIS — E782 Mixed hyperlipidemia: Secondary | ICD-10-CM

## 2020-02-09 DIAGNOSIS — E119 Type 2 diabetes mellitus without complications: Secondary | ICD-10-CM

## 2020-02-17 DIAGNOSIS — Z20822 Contact with and (suspected) exposure to covid-19: Secondary | ICD-10-CM | POA: Diagnosis not present

## 2020-04-27 ENCOUNTER — Ambulatory Visit: Payer: Medicare PPO | Admitting: Nurse Practitioner

## 2020-04-27 ENCOUNTER — Other Ambulatory Visit: Payer: Self-pay

## 2020-04-27 ENCOUNTER — Encounter: Payer: Self-pay | Admitting: Nurse Practitioner

## 2020-04-27 VITALS — BP 144/75 | HR 67 | Temp 97.8°F | Resp 20 | Ht 67.0 in | Wt 156.0 lb

## 2020-04-27 DIAGNOSIS — Z6828 Body mass index (BMI) 28.0-28.9, adult: Secondary | ICD-10-CM | POA: Diagnosis not present

## 2020-04-27 DIAGNOSIS — E782 Mixed hyperlipidemia: Secondary | ICD-10-CM | POA: Diagnosis not present

## 2020-04-27 DIAGNOSIS — E119 Type 2 diabetes mellitus without complications: Secondary | ICD-10-CM | POA: Diagnosis not present

## 2020-04-27 DIAGNOSIS — I10 Essential (primary) hypertension: Secondary | ICD-10-CM

## 2020-04-27 LAB — BAYER DCA HB A1C WAIVED: HB A1C (BAYER DCA - WAIVED): 6.3 % (ref <7.0)

## 2020-04-27 NOTE — Patient Instructions (Signed)
Exercising to Stay Healthy To become healthy and stay healthy, it is recommended that you do moderate-intensity and vigorous-intensity exercise. You can tell that you are exercising at a moderate intensity if your heart starts beating faster and you start breathing faster but can still hold a conversation. You can tell that you are exercising at a vigorous intensity if you are breathing much harder and faster and cannot hold a conversation while exercising. Exercising regularly is important. It has many health benefits, such as:  Improving overall fitness, flexibility, and endurance.  Increasing bone density.  Helping with weight control.  Decreasing body fat.  Increasing muscle strength.  Reducing stress and tension.  Improving overall health. How often should I exercise? Choose an activity that you enjoy, and set realistic goals. Your health care provider can help you make an activity plan that works for you. Exercise regularly as told by your health care provider. This may include:  Doing strength training two times a week, such as: ? Lifting weights. ? Using resistance bands. ? Push-ups. ? Sit-ups. ? Yoga.  Doing a certain intensity of exercise for a given amount of time. Choose from these options: ? A total of 150 minutes of moderate-intensity exercise every week. ? A total of 75 minutes of vigorous-intensity exercise every week. ? A mix of moderate-intensity and vigorous-intensity exercise every week. Children, pregnant women, people who have not exercised regularly, people who are overweight, and older adults may need to talk with a health care provider about what activities are safe to do. If you have a medical condition, be sure to talk with your health care provider before you start a new exercise program. What are some exercise ideas? Moderate-intensity exercise ideas include:  Walking 1 mile (1.6 km) in about 15  minutes.  Biking.  Hiking.  Golfing.  Dancing.  Water aerobics. Vigorous-intensity exercise ideas include:  Walking 4.5 miles (7.2 km) or more in about 1 hour.  Jogging or running 5 miles (8 km) in about 1 hour.  Biking 10 miles (16.1 km) or more in about 1 hour.  Lap swimming.  Roller-skating or in-line skating.  Cross-country skiing.  Vigorous competitive sports, such as football, basketball, and soccer.  Jumping rope.  Aerobic dancing.   What are some everyday activities that can help me to get exercise?  Yard work, such as: ? Pushing a lawn mower. ? Raking and bagging leaves.  Washing your car.  Pushing a stroller.  Shoveling snow.  Gardening.  Washing windows or floors. How can I be more active in my day-to-day activities?  Use stairs instead of an elevator.  Take a walk during your lunch break.  If you drive, park your car farther away from your work or school.  If you take public transportation, get off one stop early and walk the rest of the way.  Stand up or walk around during all of your indoor phone calls.  Get up, stretch, and walk around every 30 minutes throughout the day.  Enjoy exercise with a friend. Support to continue exercising will help you keep a regular routine of activity. What guidelines can I follow while exercising?  Before you start a new exercise program, talk with your health care provider.  Do not exercise so much that you hurt yourself, feel dizzy, or get very short of breath.  Wear comfortable clothes and wear shoes with good support.  Drink plenty of water while you exercise to prevent dehydration or heat stroke.  Work out until   your breathing and your heartbeat get faster. Where to find more information  [place] Department of Health and Human Services: www.hhs.gov  Centers for Disease Control and Prevention (CDC): www.cdc.gov Summary  Exercising regularly is important. It will improve your overall fitness,  flexibility, and endurance.  Regular exercise also will improve your overall health. It can help you control your weight, reduce stress, and improve your bone density.  Do not exercise so much that you hurt yourself, feel dizzy, or get very short of breath.  Before you start a new exercise program, talk with your health care provider. This information is not intended to replace advice given to you by your health care provider. Make sure you discuss any questions you have with your health care provider. Document Revised: 01/03/2017 Document Reviewed: 12/12/2016 Elsevier Patient Education  2021 Elsevier Inc.  

## 2020-04-27 NOTE — Progress Notes (Signed)
Subjective:    Patient ID: Rodney Glover, male    DOB: September 09, 1950, 70 y.o.   MRN: [id number]   Chief Complaint: Follow up for chronic disease management   HPI:  1. Primary hypertension On Amlodipine-benazepril. Tolerating well. Does not check BP at home. No HA, SOB, Dizziness. Exercises daily.   BP Readings from Last 3 Encounters:  04/27/20 (!) 144/75  01/25/20 132/84  10/13/19 120/70    2. Type 2 diabetes mellitus without complication, unspecified whether long term insulin use (HCC) On glimepiride, Januvia, metformin. No issues with hypoglycemia. FBG running in 80- low 100's.   Lab Results  Component Value Date   HGBA1C 6.9 01/25/2020    3. Mixed hyperlipidemia On rosuvastatin, ezetimibe. Tolerating well. Tries to follow low fat diet. Weighs every morning.    Lab Results  Component Value Date   CHOL 126 01/25/2020   HDL 47 01/25/2020   LDLCALC 63 01/25/2020   TRIG 79 01/25/2020   CHOLHDL 2.7 01/25/2020     4. BMI 28.0-28.9,adult Exercises daily. Eats healthy and golfs regularly.   Wt Readings from Last 3 Encounters:  04/27/20 156 lb (70.8 kg)  01/25/20 159 lb (72.1 kg)  10/13/19 159 lb (72.1 kg)     Outpatient Encounter Medications as of 04/27/2020  Medication Sig  . amLODipine-benazepril (LOTREL) 5-20 MG capsule Take 1 capsule by mouth daily.  Marland Kitchen aspirin 81 MG tablet Take 81 mg by mouth daily.  . Blood Glucose Monitoring Suppl (ACCU-CHEK AVIVA PLUS) w/Device KIT Use to check blood sugar daily  . Cholecalciferol (VITAMIN D-3) 5000 UNITS TABS Take 10,000 each by mouth daily.  . Cinnamon 500 MG TABS Take by mouth 2 (two) times daily.  Marland Kitchen ezetimibe (ZETIA) 10 MG tablet TAKE 1 TABLET (10 MG TOTAL) BY MOUTH DAILY.  . fish oil-omega-3 fatty acids 1000 MG capsule Take 2 g by mouth daily. 4 per day 108m per day  . glimepiride (AMARYL) 4 MG tablet TAKE 1 TABLET EVERY DAY WITH BREAKFAST  . glucose blood (ACCU-CHEK AVIVA PLUS) test strip Test 1x a day- dx 250.02   . JANUVIA 100 MG tablet TAKE 1 TABLET EVERY DAY  . metFORMIN (GLUCOPHAGE) 1000 MG tablet TAKE 1 TABLET TWICE DAILY WITH MEALS  . Multiple Vitamin (MULTIVITAMIN) capsule Take 1 capsule by mouth daily.  . rosuvastatin (CRESTOR) 20 MG tablet TAKE 1 TABLET EVERY DAY   No facility-administered encounter medications on file as of 04/27/2020.    Past Surgical History:  Procedure Laterality Date  . APPENDECTOMY    . COLONOSCOPY    . HERNIA REPAIR    . KNEE SURGERY Bilateral 11 03 2014   Dr. BTonita Cong   Family History  Problem Relation Age of Onset  . Hyperlipidemia Mother   . Heart disease Father   . Cancer Father        bladder, and prostate  . Stroke Father   . Coronary artery disease Father   . Prostate cancer Father   . Colon cancer Father   . Hyperlipidemia Sister   . Hypertension Sister   . Prostate cancer Other   . Diabetes Other   . Heart disease Other   . Esophageal cancer Neg Hx   . Rectal cancer Neg Hx   . Stomach cancer Neg Hx     New complaints: None  Social history: Lives with wife. Sold their home and building a new one.   Controlled substance contract: N/A     Review of Systems  Constitutional: Negative for chills, fatigue and fever.  Respiratory: Negative for cough, shortness of breath and wheezing.   Cardiovascular: Negative for chest pain and palpitations.  Gastrointestinal: Negative for abdominal distention, abdominal pain, constipation and diarrhea.  Genitourinary: Negative for difficulty urinating, dysuria and frequency.  Neurological: Negative for dizziness, numbness and headaches.  Psychiatric/Behavioral: Negative for confusion. The patient is not nervous/anxious.        Objective:   Physical Exam Constitutional:      Appearance: Normal appearance. He is normal weight.  Neck:     Vascular: No carotid bruit.  Cardiovascular:     Rate and Rhythm: Normal rate and regular rhythm.     Pulses: Normal pulses.     Heart sounds: Normal heart  sounds.  Pulmonary:     Effort: Pulmonary effort is normal.     Breath sounds: Normal breath sounds.  Abdominal:     General: Abdomen is flat.     Palpations: Abdomen is soft.  Musculoskeletal:        General: Normal range of motion.     Cervical back: Normal range of motion and neck supple.  Skin:    General: Skin is warm.  Neurological:     Mental Status: He is alert and oriented to person, place, and time.  Psychiatric:        Mood and Affect: Mood normal.        Behavior: Behavior normal.        Thought Content: Thought content normal.        Judgment: Judgment normal.    Vitals:   04/27/20 0819  BP: (!) 144/75  Pulse: 67  Resp: 20  Temp: 97.8 F (36.6 C)  SpO2: 98%    Today's A1c 6.3%  Assessment & Plan:   Rodney Glover comes in today with chief complaint of Medical Management of Chronic Issues   Diagnosis and orders addressed:  1. Primary hypertension Continue diet and exercise. Monitor BP periodically.Continue medications as prescribed.   - CBC with Differential/Platelet - CMP14+EGFR  2. Type 2 diabetes mellitus without complication, unspecified whether long term insulin use (HCC) Continue low carb diet and regular exercise. Continue medications as prescribed.   - Bayer DCA Hb A1c Waived  3. Mixed hyperlipidemia Continue medications as prescribed. Continue healthy diet and regular exercise.   - Lipid panel  4. BMI 28.0-28.9,adult Weight stable.    Labs pending Health Maintenance reviewed Diet and exercise encouraged  Follow up plan: 3 months   Dollene Primrose, RN, BSN, FNP-Student  Mary-Margaret Hassell Done, FNP

## 2020-04-28 LAB — CMP14+EGFR
ALT: 26 IU/L (ref 0–44)
AST: 28 IU/L (ref 0–40)
Albumin/Globulin Ratio: 2 (ref 1.2–2.2)
Albumin: 4.5 g/dL (ref 3.8–4.8)
Alkaline Phosphatase: 64 IU/L (ref 44–121)
BUN/Creatinine Ratio: 11 (ref 10–24)
BUN: 8 mg/dL (ref 8–27)
Bilirubin Total: 0.4 mg/dL (ref 0.0–1.2)
CO2: 22 mmol/L (ref 20–29)
Calcium: 9.2 mg/dL (ref 8.6–10.2)
Chloride: 102 mmol/L (ref 96–106)
Creatinine, Ser: 0.76 mg/dL (ref 0.76–1.27)
Globulin, Total: 2.3 g/dL (ref 1.5–4.5)
Glucose: 110 mg/dL — ABNORMAL HIGH (ref 65–99)
Potassium: 4.8 mmol/L (ref 3.5–5.2)
Sodium: 141 mmol/L (ref 134–144)
Total Protein: 6.8 g/dL (ref 6.0–8.5)
eGFR: 97 mL/min/{1.73_m2} (ref >59)

## 2020-04-28 LAB — CBC WITH DIFFERENTIAL/PLATELET
Basophils Absolute: 0.1 10*3/uL (ref 0.0–0.2)
Basos: 1 %
EOS (ABSOLUTE): 0.3 10*3/uL (ref 0.0–0.4)
Eos: 5 %
Hematocrit: 42.2 % (ref 37.5–51.0)
Hemoglobin: 14.2 g/dL (ref 13.0–17.7)
Immature Grans (Abs): 0 10*3/uL (ref 0.0–0.1)
Immature Granulocytes: 0 %
Lymphocytes Absolute: 2 10*3/uL (ref 0.7–3.1)
Lymphs: 35 %
MCH: 31.4 pg (ref 26.6–33.0)
MCHC: 33.6 g/dL (ref 31.5–35.7)
MCV: 93 fL (ref 79–97)
Monocytes Absolute: 0.5 10*3/uL (ref 0.1–0.9)
Monocytes: 8 %
Neutrophils Absolute: 2.8 10*3/uL (ref 1.4–7.0)
Neutrophils: 51 %
Platelets: 235 10*3/uL (ref 150–450)
RBC: 4.52 x10E6/uL (ref 4.14–5.80)
RDW: 13.1 % (ref 11.6–15.4)
WBC: 5.6 10*3/uL (ref 3.4–10.8)

## 2020-04-28 LAB — LIPID PANEL
Chol/HDL Ratio: 2.6 ratio (ref 0.0–5.0)
Cholesterol, Total: 121 mg/dL (ref 100–199)
HDL: 47 mg/dL (ref >39)
LDL Chol Calc (NIH): 62 mg/dL (ref 0–99)
Triglycerides: 53 mg/dL (ref 0–149)
VLDL Cholesterol Cal: 12 mg/dL (ref 5–40)

## 2020-05-09 ENCOUNTER — Other Ambulatory Visit: Payer: Self-pay | Admitting: Nurse Practitioner

## 2020-05-09 DIAGNOSIS — E119 Type 2 diabetes mellitus without complications: Secondary | ICD-10-CM

## 2020-05-09 DIAGNOSIS — I10 Essential (primary) hypertension: Secondary | ICD-10-CM

## 2020-05-20 ENCOUNTER — Other Ambulatory Visit: Payer: Self-pay | Admitting: Nurse Practitioner

## 2020-05-20 DIAGNOSIS — E782 Mixed hyperlipidemia: Secondary | ICD-10-CM

## 2020-05-20 DIAGNOSIS — E119 Type 2 diabetes mellitus without complications: Secondary | ICD-10-CM

## 2020-06-15 DIAGNOSIS — L570 Actinic keratosis: Secondary | ICD-10-CM | POA: Diagnosis not present

## 2020-06-20 LAB — HM DIABETES EYE EXAM

## 2020-07-31 ENCOUNTER — Ambulatory Visit: Payer: Self-pay | Admitting: Nurse Practitioner

## 2020-08-02 ENCOUNTER — Encounter: Payer: Self-pay | Admitting: Internal Medicine

## 2020-08-12 ENCOUNTER — Other Ambulatory Visit: Payer: Self-pay | Admitting: Nurse Practitioner

## 2020-08-12 DIAGNOSIS — E782 Mixed hyperlipidemia: Secondary | ICD-10-CM

## 2020-08-12 DIAGNOSIS — E119 Type 2 diabetes mellitus without complications: Secondary | ICD-10-CM

## 2020-08-15 ENCOUNTER — Ambulatory Visit: Payer: Self-pay | Admitting: Nurse Practitioner

## 2020-08-22 ENCOUNTER — Ambulatory Visit: Payer: Medicare PPO | Admitting: Nurse Practitioner

## 2020-08-22 ENCOUNTER — Other Ambulatory Visit: Payer: Self-pay

## 2020-08-22 ENCOUNTER — Encounter: Payer: Self-pay | Admitting: Nurse Practitioner

## 2020-08-22 VITALS — BP 116/60 | HR 64 | Temp 98.0°F | Resp 20 | Ht 67.0 in | Wt 159.0 lb

## 2020-08-22 DIAGNOSIS — E782 Mixed hyperlipidemia: Secondary | ICD-10-CM

## 2020-08-22 DIAGNOSIS — Z6824 Body mass index (BMI) 24.0-24.9, adult: Secondary | ICD-10-CM | POA: Diagnosis not present

## 2020-08-22 DIAGNOSIS — I10 Essential (primary) hypertension: Secondary | ICD-10-CM | POA: Diagnosis not present

## 2020-08-22 DIAGNOSIS — E119 Type 2 diabetes mellitus without complications: Secondary | ICD-10-CM

## 2020-08-22 LAB — BAYER DCA HB A1C WAIVED: HB A1C (BAYER DCA - WAIVED): 6.9 % (ref <7.0)

## 2020-08-22 MED ORDER — METFORMIN HCL 1000 MG PO TABS: ORAL_TABLET | ORAL | 1 refills | Status: DC

## 2020-08-22 MED ORDER — SITAGLIPTIN PHOSPHATE 100 MG PO TABS: 100.0000 mg | ORAL_TABLET | Freq: Every day | ORAL | 1 refills | Status: DC

## 2020-08-22 MED ORDER — ROSUVASTATIN CALCIUM 20 MG PO TABS: 20.0000 mg | ORAL_TABLET | Freq: Every day | ORAL | 1 refills | Status: DC

## 2020-08-22 MED ORDER — GLIMEPIRIDE 4 MG PO TABS: 4.0000 mg | ORAL_TABLET | Freq: Every day | ORAL | 1 refills | Status: DC

## 2020-08-22 NOTE — Patient Instructions (Signed)
https://www.diabeteseducator.org/docs/default-source/living-with-diabetes/conquering-the-grocery-store-v1.pdf?sfvrsn=4">  Carbohydrate Counting for Diabetes Mellitus, Adult Carbohydrate counting is a method of keeping track of how many carbohydrates you eat. Eating carbohydrates naturally increases the amount of sugar (glucose) in the blood. Counting how many carbohydrates you eat improves your bloodglucose control, which helps you manage your diabetes. It is important to know how many carbohydrates you can safely have in each meal. This is different for every person. A dietitian can help you make a meal plan and calculate how many carbohydrates you should have at each meal andsnack. What foods contain carbohydrates? Carbohydrates are found in the following foods: Grains, such as breads and cereals. Dried beans and soy products. Starchy vegetables, such as potatoes, peas, and corn. Fruit and fruit juices. Milk and yogurt. Sweets and snack foods, such as cake, cookies, candy, chips, and soft drinks. How do I count carbohydrates in foods? There are two ways to count carbohydrates in food. You can read food labels or learn standard serving sizes of foods. You can use either of the methods or acombination of both. Using the Nutrition Facts label The Nutrition Facts list is included on the labels of almost all packaged foods and beverages in the [place] It includes: The serving size. Information about nutrients in each serving, including the grams (g) of carbohydrate per serving. To use the Nutrition Facts: Decide how many servings you will have. Multiply the number of servings by the number of carbohydrates per serving. The resulting number is the total amount of carbohydrates that you will be having. Learning the standard serving sizes of foods When you eat carbohydrate foods that are not packaged or do not include Nutrition Facts on the label, you need to measure the servings in order to count the  amount of carbohydrates. Measure the foods that you will eat with a food scale or measuring cup, if needed. Decide how many standard-size servings you will eat. Multiply the number of servings by 15. For foods that contain carbohydrates, one serving equals 15 g of carbohydrates. For example, if you eat 2 cups or 10 oz (300 g) of strawberries, you will have eaten 2 servings and 30 g of carbohydrates (2 servings x 15 g = 30 g). For foods that have more than one food mixed, such as soups and casseroles, you must count the carbohydrates in each food that is included. The following list contains standard serving sizes of common carbohydrate-rich foods. Each of these servings has about 15 g of carbohydrates: 1 slice of bread. 1 six-inch (15 cm) tortilla. ? cup or 2 oz (53 g) cooked rice or pasta.  cup or 3 oz (85 g) cooked or canned, drained and rinsed beans or lentils.  cup or 3 oz (85 g) starchy vegetable, such as peas, corn, or squash.  cup or 4 oz (120 g) hot cereal.  cup or 3 oz (85 g) boiled or mashed potatoes, or  or 3 oz (85 g) of a large baked potato.  cup or 4 fl oz (118 mL) fruit juice. 1 cup or 8 fl oz (237 mL) milk. 1 small or 4 oz (106 g) apple.  or 2 oz (63 g) of a medium banana. 1 cup or 5 oz (150 g) strawberries. 3 cups or 1 oz (24 g) popped popcorn. What is an example of carbohydrate counting? To calculate the number of carbohydrates in this sample meal, follow the stepsshown below. Sample meal 3 oz (85 g) chicken breast. ? cup or 4 oz (106 g) brown rice.    cup or 3 oz (85 g) corn. 1 cup or 8 fl oz (237 mL) milk. 1 cup or 5 oz (150 g) strawberries with sugar-free whipped topping. Carbohydrate calculation Identify the foods that contain carbohydrates: Rice. Corn. Milk. Strawberries. Calculate how many servings you have of each food: 2 servings rice. 1 serving corn. 1 serving milk. 1 serving strawberries. Multiply each number of servings by 15 g: 2 servings  rice x 15 g = 30 g. 1 serving corn x 15 g = 15 g. 1 serving milk x 15 g = 15 g. 1 serving strawberries x 15 g = 15 g. Add together all of the amounts to find the total grams of carbohydrates eaten: 30 g + 15 g + 15 g + 15 g = 75 g of carbohydrates total. What are tips for following this plan? Shopping Develop a meal plan and then make a shopping list. Buy fresh and frozen vegetables, fresh and frozen fruit, dairy, eggs, beans, lentils, and whole grains. Look at food labels. Choose foods that have more fiber and less sugar. Avoid processed foods and foods with added sugars. Meal planning Aim to have the same amount of carbohydrates at each meal and for each snack time. Plan to have regular, balanced meals and snacks. Where to find more information American Diabetes Association: www.diabetes.org Centers for Disease Control and Prevention: www.cdc.gov Summary Carbohydrate counting is a method of keeping track of how many carbohydrates you eat. Eating carbohydrates naturally increases the amount of sugar (glucose) in the blood. Counting how many carbohydrates you eat improves your blood glucose control, which helps you manage your diabetes. A dietitian can help you make a meal plan and calculate how many carbohydrates you should have at each meal and snack. This information is not intended to replace advice given to you by your health care provider. Make sure you discuss any questions you have with your healthcare provider. Document Revised: 01/21/2019 Document Reviewed: 01/22/2019 Elsevier Patient Education  2021 Elsevier Inc.  

## 2020-08-22 NOTE — Progress Notes (Signed)
Subjective:    Patient ID: Rodney Glover, male    DOB: [birth year], 70 y.o.   MRN: [id number]   Chief Complaint: Medical Management of Chronic Issues    HPI:  1. Primary hypertension No c/o chest pain, sob or headache. Does not check blood pressure at home BP Readings from Last 3 Encounters:  08/22/20 116/60  04/27/20 (!) 144/75  01/25/20 132/84     2. Type 2 diabetes mellitus without complication, unspecified whether long term insulin use (HCC) Fasting blood sugars running high. He has not been watching his diet. Lab Results  Component Value Date   HGBA1C 6.3 04/27/2020     3. Mixed hyperlipidemia Does try to watch diet , but not as much lately as has been . Stays very active. Lab Results  Component Value Date   CHOL 121 04/27/2020   HDL 47 04/27/2020   LDLCALC 62 04/27/2020   TRIG 53 04/27/2020   CHOLHDL 2.6 04/27/2020     4. BMI 24.0-24.9,adult No recent weight changes Wt Readings from Last 3 Encounters:  08/22/20 159 lb (72.1 kg)  04/27/20 156 lb (70.8 kg)  01/25/20 159 lb (72.1 kg)   BMI Readings from Last 3 Encounters:  08/22/20 24.90 kg/m  04/27/20 24.43 kg/m  01/25/20 24.90 kg/m       Outpatient Encounter Medications as of 08/22/2020  Medication Sig   amLODipine-benazepril (LOTREL) 5-20 MG capsule TAKE 1 CAPSULE EVERY DAY   aspirin 81 MG tablet Take 81 mg by mouth daily.   Blood Glucose Monitoring Suppl (ACCU-CHEK AVIVA PLUS) w/Device KIT Use to check blood sugar daily   Cholecalciferol (VITAMIN D-3) 5000 UNITS TABS Take 10,000 each by mouth daily.   Cinnamon 500 MG TABS Take by mouth 2 (two) times daily.   cyanocobalamin 1000 MCG tablet Take 1,000 mcg by mouth daily.   ezetimibe (ZETIA) 10 MG tablet TAKE 1 TABLET EVERY DAY   fish oil-omega-3 fatty acids 1000 MG capsule Take 2 g by mouth daily. 4 per day $Remov'1000mg'nTaOuu$  per day   glimepiride (AMARYL) 4 MG tablet TAKE 1 TABLET EVERY DAY WITH BREAKFAST   glucose blood (ACCU-CHEK AVIVA PLUS) test  strip Test 1x a day- dx 250.02   JANUVIA 100 MG tablet TAKE 1 TABLET EVERY DAY   metFORMIN (GLUCOPHAGE) 1000 MG tablet TAKE 1 TABLET TWICE DAILY WITH MEALS   Multiple Vitamin (MULTIVITAMIN) capsule Take 1 capsule by mouth daily.   rosuvastatin (CRESTOR) 20 MG tablet TAKE 1 TABLET EVERY DAY   No facility-administered encounter medications on file as of 08/22/2020.    Past Surgical History:  Procedure Laterality Date   APPENDECTOMY     COLONOSCOPY     HERNIA REPAIR     KNEE SURGERY Bilateral 11 03 2014   Dr. Tonita Cong    Family History  Problem Relation Age of Onset   Hyperlipidemia Mother    Heart disease Father    Cancer Father        bladder, and prostate   Stroke Father    Coronary artery disease Father    Prostate cancer Father    Colon cancer Father    Hyperlipidemia Sister    Hypertension Sister    Prostate cancer Other    Diabetes Other    Heart disease Other    Esophageal cancer Neg Hx    Rectal cancer Neg Hx    Stomach cancer Neg Hx     New complaints: None today  Social history: Is currently living with his  son because they are building a new house.  Controlled substance contract: n/a     Review of Systems  Constitutional:  Negative for diaphoresis.  Eyes:  Negative for pain.  Respiratory:  Negative for shortness of breath.   Cardiovascular:  Negative for chest pain, palpitations and leg swelling.  Gastrointestinal:  Negative for abdominal pain.  Endocrine: Negative for polydipsia.  Skin:  Negative for rash.  Neurological:  Negative for dizziness, weakness and headaches.  Hematological:  Does not bruise/bleed easily.  All other systems reviewed and are negative.     Objective:   Physical Exam Vitals and nursing note reviewed.  Constitutional:      Appearance: Normal appearance. He is well-developed.  HENT:     Head: Normocephalic.     Nose: Nose normal.  Eyes:     Pupils: Pupils are equal, round, and reactive to light.  Neck:     Thyroid:  No thyroid mass or thyromegaly.     Vascular: No carotid bruit or JVD.     Trachea: Phonation normal.  Cardiovascular:     Rate and Rhythm: Normal rate and regular rhythm.  Pulmonary:     Effort: Pulmonary effort is normal. No respiratory distress.     Breath sounds: Normal breath sounds.  Abdominal:     General: Bowel sounds are normal.     Palpations: Abdomen is soft.     Tenderness: There is no abdominal tenderness.  Musculoskeletal:        General: Normal range of motion.     Cervical back: Normal range of motion and neck supple.  Lymphadenopathy:     Cervical: No cervical adenopathy.  Skin:    General: Skin is warm and dry.  Neurological:     Mental Status: He is alert and oriented to person, place, and time.  Psychiatric:        Behavior: Behavior normal.        Thought Content: Thought content normal.        Judgment: Judgment normal.    BP 116/60   Pulse 64   Temp 98 F (36.7 C) (Temporal)   Resp 20   Ht $R'5\' 7"'Ex$  (1.702 m)   Wt 159 lb (72.1 kg)   SpO2 97%   BMI 24.90 kg/m   HGBA1c 6.9%      Assessment & Plan:   CHRISTPOHER SIEVERS comes in today with chief complaint of Medical Management of Chronic Issues   Diagnosis and orders addressed:  1. Primary hypertension Low sodium diet - CBC with Differential/Platelet - CMP14+EGFR  2. Type 2 diabetes mellitus without complication, without long-term current use of insulin (HCC) Continue to watch carbs in diet - Bayer DCA Hb A1c Waived - Microalbumin / creatinine urine ratio - sitaGLIPtin (JANUVIA) 100 MG tablet; Take 1 tablet (100 mg total) by mouth daily.  Dispense: 90 tablet; Refill: 1 - glimepiride (AMARYL) 4 MG tablet; Take 1 tablet (4 mg total) by mouth daily with breakfast.  Dispense: 90 tablet; Refill: 1 - metFORMIN (GLUCOPHAGE) 1000 MG tablet; TAKE 1 TABLET TWICE DAILY WITH MEALS  Dispense: 180 tablet; Refill: 1   3. Mixed hyperlipidemia Low fat diet - Lipid panel - rosuvastatin (CRESTOR) 20 MG  tablet; Take 1 tablet (20 mg total) by mouth daily.  Dispense: 90 tablet; Refill: 1  4. BMI 24.0-24.9, adult Discussed diet and exercise for person with BMI >25 Will recheck weight in 3-6 months   Labs pending Health Maintenance reviewed Diet and exercise encouraged  Follow up plan: 3 months   Mary-Margaret Hassell Done, FNP

## 2020-08-23 ENCOUNTER — Encounter: Payer: Self-pay | Admitting: Internal Medicine

## 2020-08-23 LAB — LIPID PANEL
Chol/HDL Ratio: 2.6 ratio (ref 0.0–5.0)
Cholesterol, Total: 121 mg/dL (ref 100–199)
HDL: 47 mg/dL (ref >39)
LDL Chol Calc (NIH): 61 mg/dL (ref 0–99)
Triglycerides: 59 mg/dL (ref 0–149)
VLDL Cholesterol Cal: 13 mg/dL (ref 5–40)

## 2020-08-23 LAB — CMP14+EGFR
ALT: 32 IU/L (ref 0–44)
AST: 30 IU/L (ref 0–40)
Albumin/Globulin Ratio: 2.1 (ref 1.2–2.2)
Albumin: 4.7 g/dL (ref 3.8–4.8)
Alkaline Phosphatase: 72 IU/L (ref 44–121)
BUN/Creatinine Ratio: 13 (ref 10–24)
BUN: 10 mg/dL (ref 8–27)
Bilirubin Total: 0.4 mg/dL (ref 0.0–1.2)
CO2: 24 mmol/L (ref 20–29)
Calcium: 9.6 mg/dL (ref 8.6–10.2)
Chloride: 102 mmol/L (ref 96–106)
Creatinine, Ser: 0.79 mg/dL (ref 0.76–1.27)
Globulin, Total: 2.2 g/dL (ref 1.5–4.5)
Glucose: 149 mg/dL — ABNORMAL HIGH (ref 65–99)
Potassium: 5.3 mmol/L — ABNORMAL HIGH (ref 3.5–5.2)
Sodium: 140 mmol/L (ref 134–144)
Total Protein: 6.9 g/dL (ref 6.0–8.5)
eGFR: 96 mL/min/{1.73_m2} (ref >59)

## 2020-08-23 LAB — CBC WITH DIFFERENTIAL/PLATELET
Basophils Absolute: 0.1 10*3/uL (ref 0.0–0.2)
Basos: 1 %
EOS (ABSOLUTE): 0.3 10*3/uL (ref 0.0–0.4)
Eos: 5 %
Hematocrit: 41.9 % (ref 37.5–51.0)
Hemoglobin: 13.9 g/dL (ref 13.0–17.7)
Immature Grans (Abs): 0 10*3/uL (ref 0.0–0.1)
Immature Granulocytes: 0 %
Lymphocytes Absolute: 2 10*3/uL (ref 0.7–3.1)
Lymphs: 30 %
MCH: 30.6 pg (ref 26.6–33.0)
MCHC: 33.2 g/dL (ref 31.5–35.7)
MCV: 92 fL (ref 79–97)
Monocytes Absolute: 0.5 10*3/uL (ref 0.1–0.9)
Monocytes: 8 %
Neutrophils Absolute: 3.6 10*3/uL (ref 1.4–7.0)
Neutrophils: 56 %
Platelets: 213 10*3/uL (ref 150–450)
RBC: 4.54 x10E6/uL (ref 4.14–5.80)
RDW: 12.8 % (ref 11.6–15.4)
WBC: 6.5 10*3/uL (ref 3.4–10.8)

## 2020-11-06 ENCOUNTER — Other Ambulatory Visit: Payer: Self-pay

## 2020-11-06 ENCOUNTER — Ambulatory Visit (AMBULATORY_SURGERY_CENTER): Payer: Medicare PPO | Admitting: *Deleted

## 2020-11-06 ENCOUNTER — Encounter: Payer: Self-pay | Admitting: Internal Medicine

## 2020-11-06 VITALS — Ht 67.0 in | Wt 152.0 lb

## 2020-11-06 DIAGNOSIS — Z800 Family history of malignant neoplasm of digestive organs: Secondary | ICD-10-CM

## 2020-11-06 DIAGNOSIS — Z86010 Personal history of colonic polyps: Secondary | ICD-10-CM

## 2020-11-06 MED ORDER — NA SULFATE-K SULFATE-MG SULF 17.5-3.13-1.6 GM/177ML PO SOLN
1.0000 | Freq: Once | ORAL | 0 refills | Status: AC
Start: 2020-11-06 — End: 2020-11-06

## 2020-11-06 NOTE — Progress Notes (Signed)
Pt verified name, DOB, address and insurance during PV today.  Pt mailed instruction packet of Emmi video, copy of consent form to read and not return, and instructions.   PV completed over the phone.  Pt encouraged to call with questions or issues.  My Chart instructions to pt as well    No egg or soy allergy known to patient  No issues known to pt with past sedation with any surgeries or procedures Patient denies ever being told they had issues or difficulty with intubation  No FH of Malignant Hyperthermia Pt is not on diet pills Pt is not on  home 02  Pt is not on blood thinners  Pt denies issues with constipation  No A fib or A flutter  EMMI video to pt or via MyChart   Pt is fully vaccinated  for Covid    Due to the COVID-19 pandemic we are asking patients to follow certain guidelines.  Pt aware of COVID protocols and [facility] guidelines

## 2020-11-08 ENCOUNTER — Other Ambulatory Visit: Payer: Self-pay | Admitting: Nurse Practitioner

## 2020-11-08 DIAGNOSIS — I10 Essential (primary) hypertension: Secondary | ICD-10-CM

## 2020-11-20 ENCOUNTER — Encounter: Payer: Self-pay | Admitting: Internal Medicine

## 2020-11-20 ENCOUNTER — Ambulatory Visit (AMBULATORY_SURGERY_CENTER): Payer: Medicare PPO | Admitting: Internal Medicine

## 2020-11-20 VITALS — BP 109/73 | HR 62 | Temp 97.5°F | Resp 14 | Ht 67.0 in | Wt 150.0 lb

## 2020-11-20 DIAGNOSIS — Z86010 Personal history of colonic polyps: Secondary | ICD-10-CM | POA: Diagnosis not present

## 2020-11-20 DIAGNOSIS — E119 Type 2 diabetes mellitus without complications: Secondary | ICD-10-CM | POA: Diagnosis not present

## 2020-11-20 DIAGNOSIS — E785 Hyperlipidemia, unspecified: Secondary | ICD-10-CM | POA: Diagnosis not present

## 2020-11-20 MED ORDER — SODIUM CHLORIDE 0.9 % IV SOLN: 500.0000 mL | Freq: Once | INTRAVENOUS | Status: DC

## 2020-11-20 NOTE — Patient Instructions (Signed)
Resume previous diet and continue present medications. Repeat colonoscopy in 5 years for surveillance.  YOU HAD AN ENDOSCOPIC PROCEDURE TODAY AT [facility]:   Refer to the procedure report that was given to you for any specific questions about what was found during the examination.  If the procedure report does not answer your questions, please call your gastroenterologist to clarify.  If you requested that your care partner not be given the details of your procedure findings, then the procedure report has been included in a sealed envelope for you to review at your convenience later.  YOU SHOULD EXPECT: Some feelings of bloating in the abdomen. Passage of more gas than usual.  Walking can help get rid of the air that was put into your GI tract during the procedure and reduce the bloating. If you had a lower endoscopy (such as a colonoscopy or flexible sigmoidoscopy) you may notice spotting of blood in your stool or on the toilet paper. If you underwent a bowel prep for your procedure, you may not have a normal bowel movement for a few days.  Please Note:  You might notice some irritation and congestion in your nose or some drainage.  This is from the oxygen used during your procedure.  There is no need for concern and it should clear up in a day or so.  SYMPTOMS TO REPORT IMMEDIATELY:  Following lower endoscopy (colonoscopy or flexible sigmoidoscopy):  Excessive amounts of blood in the stool  Significant tenderness or worsening of abdominal pains  Swelling of the abdomen that is new, acute  Fever of 100F or higher  For urgent or emergent issues, a gastroenterologist can be reached at any hour by calling [phone]. Do not use MyChart messaging for urgent concerns.    DIET:  We do recommend a small meal at first, but then you may proceed to your regular diet.  Drink plenty of fluids but you should avoid alcoholic beverages for 24 hours.  ACTIVITY:  You should plan to take  it easy for the rest of today and you should NOT DRIVE or use heavy machinery until tomorrow (because of the sedation medicines used during the test).    FOLLOW UP: Our staff will call the number listed on your records 48-72 hours following your procedure to check on you and address any questions or concerns that you may have regarding the information given to you following your procedure. If we do not reach you, we will leave a message.  We will attempt to reach you two times.  During this call, we will ask if you have developed any symptoms of COVID 19. If you develop any symptoms (ie: fever, flu-like symptoms, shortness of breath, cough etc.) before then, please call [phone].  If you test positive for Covid 19 in the 2 weeks post procedure, please call and report this information to [place].    If any biopsies were taken you will be contacted by phone or by letter within the next 1-3 weeks.  Please call us at [phone] if you have not heard about the biopsies in 3 weeks.    SIGNATURES/CONFIDENTIALITY: You and/or your care partner have signed paperwork which will be entered into your electronic medical record.  These signatures attest to the fact that that the information above on your After Visit Summary has been reviewed and is understood.  Full responsibility of the confidentiality of this discharge information lies with you and/or your care-partner.

## 2020-11-20 NOTE — Progress Notes (Signed)
HISTORY OF PRESENT ILLNESS:  Rodney Glover is a 70 y.o. male who presents today for surveillance colonoscopy.  Multiple previous colonoscopies.  History of multiple adenomatous polyps.  Last examination 2017.  No active complaints  REVIEW OF SYSTEMS:  All non-GI ROS negative.  Past Medical History:  Diagnosis Date   Colon polyp    colonoscopy 4/11-5 small polyps removed-follow up 5 years   Diabetes mellitus    Hyperlipemia    IBS (irritable bowel syndrome)    Unspecified essential hypertension     Past Surgical History:  Procedure Laterality Date   APPENDECTOMY     COLONOSCOPY     HERNIA REPAIR     KNEE SURGERY Bilateral 12/07/2012   Dr. Tonita Cong   POLYPECTOMY      Social History Vickii Chafe  reports that he has never smoked. He quit smokeless tobacco use about 31 years ago. He reports current alcohol use. He reports that he does not use drugs.  family history includes Cancer in his father; Colon cancer in his father; Coronary artery disease in his father; Diabetes in an other family member; Heart disease in his father and another family member; Hyperlipidemia in his mother and sister; Hypertension in his sister; Prostate cancer in his father and another family member; Stroke in his father.  Allergies  Allergen Reactions   Niaspan [Niacin Er] Itching   Penicillins     REACTION: hives       PHYSICAL EXAMINATION:  Vital signs: BP 140/68   Pulse 61   Temp (!) 97.5 F (36.4 C)   Ht 5\' 7"  (1.702 m)   Wt 150 lb (68 kg)   SpO2 100%   BMI 23.49 kg/m  General: Well-developed, well-nourished, no acute distress HEENT: Sclerae are anicteric, conjunctiva pink. Oral mucosa intact Lungs: Clear Heart: Regular Abdomen: soft, nontender, nondistended, no obvious ascites, no peritoneal signs, normal bowel sounds. No organomegaly. Extremities: No edema Psychiatric: alert and oriented x3. Cooperative     ASSESSMENT:  1.  History of multiple adenomatous colon polyps.  Due  for surveillance   PLAN:  1.  Surveillance colonoscopy

## 2020-11-20 NOTE — Progress Notes (Signed)
A/ox3, pleased with MAC, report to RN 

## 2020-11-20 NOTE — Progress Notes (Signed)
Pt's states no medical or surgical changes since previsit or office visit. 

## 2020-11-20 NOTE — Progress Notes (Signed)
C.W. vital signs. 

## 2020-11-20 NOTE — Op Note (Signed)
[facility] Patient Name: Rodney Glover Procedure Date: 11/20/2020 10:32 AM MRN: [id number] Endoscopist: Docia Chuck. Henrene Pastor , MD Age: 70 Referring MD:  Date of Birth: 03-12-1950 Gender: Male Account #: [id number] Procedure:                Colonoscopy Indications:              High risk colon cancer surveillance: Personal                            history of multiple (3 or more) adenomas Medicines:                Monitored Anesthesia Care Procedure:                Pre-Anesthesia Assessment:                           - Prior to the procedure, a History and Physical                            was performed, and patient medications and                            allergies were reviewed. The patient's tolerance of                            previous anesthesia was also reviewed. The risks                            and benefits of the procedure and the sedation                            options and risks were discussed with the patient.                            All questions were answered, and informed consent                            was obtained. Prior Anticoagulants: The patient has                            taken no previous anticoagulant or antiplatelet                            agents. ASA Grade Assessment: II - A patient with                            mild systemic disease. After reviewing the risks                            and benefits, the patient was deemed in                            satisfactory condition to undergo the procedure.  After obtaining informed consent, the colonoscope                            was passed under direct vision. Throughout the                            procedure, the patient's blood pressure, pulse, and                            oxygen saturations were monitored continuously. The                            PCF-HQ190L Colonoscope was introduced through the                            anus and advanced to the  the cecum, identified by                            appendiceal orifice and ileocecal valve. The                            ileocecal valve, appendiceal orifice, and rectum                            were photographed. The quality of the bowel                            preparation was excellent. The colonoscopy was                            performed without difficulty. The patient tolerated                            the procedure well. The bowel preparation used was                            SUPREP via split dose instruction. Scope In: 11:21:48 AM Scope Out: 11:37:02 AM Scope Withdrawal Time: 0 hours 10 minutes 41 seconds  Total Procedure Duration: 0 hours 15 minutes 14 seconds  Findings:                 The entire examined colon appeared normal on direct                            and retroflexion views. Internal hemorrhoids                            present. Complications:            No immediate complications. Estimated blood loss:                            None. Estimated Blood Loss:     Estimated blood loss: none. Impression:               - The entire examined colon  is normal on direct and                            retroflexion views.                           - No specimens collected. Recommendation:           - Repeat colonoscopy in 5 years for surveillance                            (history of multiple adenomas).                           - Patient has a contact number available for                            emergencies. The signs and symptoms of potential                            delayed complications were discussed with the                            patient. Return to normal activities tomorrow.                            Written discharge instructions were provided to the                            patient.                           - Resume previous diet.                           - Continue present medications. Docia Chuck. Henrene Pastor, MD 11/20/2020 11:40:46 AM This  report has been signed electronically.

## 2020-11-22 ENCOUNTER — Telehealth: Payer: Self-pay

## 2020-11-22 NOTE — Telephone Encounter (Signed)
  Follow up Call-  Call back number 11/20/2020  Post procedure Call Back phone  # [phone]  Permission to leave phone message Yes  Some recent data might be hidden     Patient questions:  Do you have a fever, pain , or abdominal swelling? No. Pain Score  0 *  Have you tolerated food without any problems? Yes.    Have you been able to return to your normal activities? Yes.    Do you have any questions about your discharge instructions: Diet   No. Medications  No. Follow up visit  No.  Do you have questions or concerns about your Care? No.  Actions: * If pain score is 4 or above: No action needed, pain <4.

## 2020-11-23 ENCOUNTER — Ambulatory Visit: Payer: Medicare PPO | Admitting: Nurse Practitioner

## 2020-11-23 ENCOUNTER — Other Ambulatory Visit: Payer: Self-pay

## 2020-11-23 ENCOUNTER — Encounter: Payer: Self-pay | Admitting: Nurse Practitioner

## 2020-11-23 VITALS — BP 116/63 | HR 70 | Temp 98.1°F | Resp 20 | Ht 67.0 in | Wt 156.0 lb

## 2020-11-23 DIAGNOSIS — I10 Essential (primary) hypertension: Secondary | ICD-10-CM

## 2020-11-23 DIAGNOSIS — E119 Type 2 diabetes mellitus without complications: Secondary | ICD-10-CM

## 2020-11-23 DIAGNOSIS — Z6828 Body mass index (BMI) 28.0-28.9, adult: Secondary | ICD-10-CM

## 2020-11-23 DIAGNOSIS — E782 Mixed hyperlipidemia: Secondary | ICD-10-CM

## 2020-11-23 LAB — BAYER DCA HB A1C WAIVED: HB A1C (BAYER DCA - WAIVED): 7.4 % — ABNORMAL HIGH (ref 4.8–5.6)

## 2020-11-23 MED ORDER — SITAGLIPTIN PHOSPHATE 100 MG PO TABS: 100.0000 mg | ORAL_TABLET | Freq: Every day | ORAL | 1 refills | Status: DC

## 2020-11-23 MED ORDER — METFORMIN HCL 1000 MG PO TABS: ORAL_TABLET | ORAL | 1 refills | Status: DC

## 2020-11-23 MED ORDER — GLIMEPIRIDE 4 MG PO TABS: 4.0000 mg | ORAL_TABLET | Freq: Every day | ORAL | 1 refills | Status: DC

## 2020-11-23 MED ORDER — AMLODIPINE BESY-BENAZEPRIL HCL 5-20 MG PO CAPS: 1.0000 | ORAL_CAPSULE | Freq: Every day | ORAL | 1 refills | Status: DC

## 2020-11-23 MED ORDER — ROSUVASTATIN CALCIUM 20 MG PO TABS: 20.0000 mg | ORAL_TABLET | Freq: Every day | ORAL | 1 refills | Status: DC

## 2020-11-23 MED ORDER — EZETIMIBE 10 MG PO TABS: 10.0000 mg | ORAL_TABLET | Freq: Every day | ORAL | 1 refills | Status: DC

## 2020-11-23 NOTE — Patient Instructions (Signed)

## 2020-11-23 NOTE — Progress Notes (Signed)
Subjective:    Patient ID: Rodney Glover, male    DOB: 1950-12-25, 70 y.o.   MRN: [id number]   Chief Complaint: Medical Management of Chronic Issues    HPI:  1. Type 2 diabetes mellitus without complication, unspecified whether long term insulin use (HCC) Fasting blood sugars are running around- no low blood sugars Lab Results  Component Value Date   HGBA1C 7.4 (H) 11/23/2020     2. Mixed hyperlipidemia Does try to watch diet and stays very active. Lab Results  Component Value Date   CHOL 121 08/22/2020   HDL 47 08/22/2020   LDLCALC 61 08/22/2020   TRIG 59 08/22/2020   CHOLHDL 2.6 08/22/2020     3. Primary hypertension No c/o chest pan, sob or headache. Does not check blod pressure at home. BP Readings from Last 3 Encounters:  11/23/20 116/63  11/20/20 109/73  08/22/20 116/60     4. BMI 28.0-28.9,adult No recent weight changes Wt Readings from Last 3 Encounters:  11/23/20 156 lb (70.8 kg)  11/20/20 150 lb (68 kg)  11/06/20 152 lb (68.9 kg)   BMI Readings from Last 3 Encounters:  11/23/20 24.43 kg/m  11/20/20 23.49 kg/m  11/06/20 23.81 kg/m      Outpatient Encounter Medications as of 11/23/2020  Medication Sig   amLODipine-benazepril (LOTREL) 5-20 MG capsule TAKE 1 CAPSULE EVERY DAY   aspirin 81 MG tablet Take 81 mg by mouth daily.   Blood Glucose Monitoring Suppl (ACCU-CHEK AVIVA PLUS) w/Device KIT Use to check blood sugar daily   Cholecalciferol (VITAMIN D-3) 5000 UNITS TABS Take 10,000 each by mouth in the morning and at bedtime.   Cinnamon 500 MG TABS Take by mouth 2 (two) times daily.   cyanocobalamin 1000 MCG tablet Take 1,000 mcg by mouth daily.   ezetimibe (ZETIA) 10 MG tablet TAKE 1 TABLET EVERY DAY   fish oil-omega-3 fatty acids 1000 MG capsule Take 2 g by mouth daily. 4 per day 1040m per day   glimepiride (AMARYL) 4 MG tablet Take 1 tablet (4 mg total) by mouth daily with breakfast.   glucose blood (ACCU-CHEK AVIVA PLUS) test strip  Test 1x a day- dx 250.02   Melatonin 10 MG TABS    metFORMIN (GLUCOPHAGE) 1000 MG tablet TAKE 1 TABLET TWICE DAILY WITH MEALS   Multiple Vitamin (MULTIVITAMIN) capsule Take 1 capsule by mouth daily.   rosuvastatin (CRESTOR) 20 MG tablet Take 1 tablet (20 mg total) by mouth daily.   sitaGLIPtin (JANUVIA) 100 MG tablet Take 1 tablet (100 mg total) by mouth daily.   No facility-administered encounter medications on file as of 11/23/2020.    Past Surgical History:  Procedure Laterality Date   APPENDECTOMY     COLONOSCOPY     HERNIA REPAIR     KNEE SURGERY Bilateral 12/07/2012   Dr. BTonita Cong  POLYPECTOMY      Family History  Problem Relation Age of Onset   Hyperlipidemia Mother    Heart disease Father    Cancer Father        bladder, and prostate   Stroke Father    Coronary artery disease Father    Prostate cancer Father    Colon cancer Father    Hyperlipidemia Sister    Hypertension Sister    Prostate cancer Other    Diabetes Other    Heart disease Other    Esophageal cancer Neg Hx    Rectal cancer Neg Hx    Stomach cancer Neg Hx  Colon polyps Neg Hx     New complaints: None today  Social history: Lives with his daughter and son in law right now while new house is being built  Controlled substance contract: n/a     Review of Systems  Constitutional:  Negative for diaphoresis.  Eyes:  Negative for pain.  Respiratory:  Negative for shortness of breath.   Cardiovascular:  Negative for chest pain, palpitations and leg swelling.  Gastrointestinal:  Negative for abdominal pain.  Endocrine: Negative for polydipsia.  Skin:  Negative for rash.  Neurological:  Negative for dizziness, weakness and headaches.  Hematological:  Does not bruise/bleed easily.  All other systems reviewed and are negative.     Objective:   Physical Exam Vitals and nursing note reviewed.  Constitutional:      Appearance: Normal appearance. He is well-developed.  HENT:     Head:  Normocephalic.     Nose: Nose normal.  Eyes:     Pupils: Pupils are equal, round, and reactive to light.  Neck:     Thyroid: No thyroid mass or thyromegaly.     Vascular: No carotid bruit or JVD.     Trachea: Phonation normal.  Cardiovascular:     Rate and Rhythm: Normal rate and regular rhythm.  Pulmonary:     Effort: Pulmonary effort is normal. No respiratory distress.     Breath sounds: Normal breath sounds.  Abdominal:     General: Bowel sounds are normal.     Palpations: Abdomen is soft.     Tenderness: There is no abdominal tenderness.  Musculoskeletal:        General: Normal range of motion.     Cervical back: Normal range of motion and neck supple.  Lymphadenopathy:     Cervical: No cervical adenopathy.  Skin:    General: Skin is warm and dry.  Neurological:     Mental Status: He is alert and oriented to person, place, and time.  Psychiatric:        Behavior: Behavior normal.        Thought Content: Thought content normal.        Judgment: Judgment normal.    BP 116/63   Pulse 70   Temp 98.1 F (36.7 C) (Temporal)   Resp 20   Ht '5\' 7"'  (1.702 m)   Wt 156 lb (70.8 kg)   SpO2 99%   BMI 24.43 kg/m   Hgba1c 7.4%     Assessment & Plan:   Rodney Glover comes in today with chief complaint of Medical Management of Chronic Issues   Diagnosis and orders addressed:  1. Type 2 diabetes mellitus without complication, unspecified whether long term insulin use (HCC) Continue to watch carb sin diet - CBC with Differential/Platelet - Bayer DCA Hb A1c Waived - Microalbumin / creatinine urine ratio - glimepiride (AMARYL) 4 MG tablet; Take 1 tablet (4 mg total) by mouth daily with breakfast.  Dispense: 90 tablet; Refill: 1 - sitaGLIPtin (JANUVIA) 100 MG tablet; Take 1 tablet (100 mg total) by mouth daily.  Dispense: 90 tablet; Refill: 1 - metFORMIN (GLUCOPHAGE) 1000 MG tablet; TAKE 1 TABLET TWICE DAILY WITH MEALS  Dispense: 180 tablet; Refill: 1  2. Mixed  hyperlipidemia Low fat diet - CMP14+EGFR - Lipid panel - rosuvastatin (CRESTOR) 20 MG tablet; Take 1 tablet (20 mg total) by mouth daily.  Dispense: 90 tablet; Refill: 1 - ezetimibe (ZETIA) 10 MG tablet; Take 1 tablet (10 mg total) by mouth daily.  Dispense: 90  tablet; Refill: 1  3. Primary hypertension Low sodium diet - amLODipine-benazepril (LOTREL) 5-20 MG capsule; Take 1 capsule by mouth daily.  Dispense: 90 capsule; Refill: 1  4. BMI 28.0-28.9,adult Discussed diet and exercise for person with BMI >25 Will recheck weight in 3-6 months    Labs pending Health Maintenance reviewed Diet and exercise encouraged  Follow up plan: 3 months   Mary-Margaret Hassell Done, FNP

## 2020-11-24 LAB — CBC WITH DIFFERENTIAL/PLATELET
Basophils Absolute: 0.1 10*3/uL (ref 0.0–0.2)
Basos: 1 %
EOS (ABSOLUTE): 0.3 10*3/uL (ref 0.0–0.4)
Eos: 5 %
Hematocrit: 41 % (ref 37.5–51.0)
Hemoglobin: 13.6 g/dL (ref 13.0–17.7)
Immature Grans (Abs): 0 10*3/uL (ref 0.0–0.1)
Immature Granulocytes: 0 %
Lymphocytes Absolute: 2.2 10*3/uL (ref 0.7–3.1)
Lymphs: 36 %
MCH: 29.6 pg (ref 26.6–33.0)
MCHC: 33.2 g/dL (ref 31.5–35.7)
MCV: 89 fL (ref 79–97)
Monocytes Absolute: 0.5 10*3/uL (ref 0.1–0.9)
Monocytes: 9 %
Neutrophils Absolute: 3.1 10*3/uL (ref 1.4–7.0)
Neutrophils: 49 %
Platelets: 224 10*3/uL (ref 150–450)
RBC: 4.59 x10E6/uL (ref 4.14–5.80)
RDW: 12.5 % (ref 11.6–15.4)
WBC: 6.2 10*3/uL (ref 3.4–10.8)

## 2020-11-24 LAB — MICROALBUMIN / CREATININE URINE RATIO
Creatinine, Urine: 61.9 mg/dL
Microalb/Creat Ratio: <5 mg/g creat (ref 0–29)
Microalbumin, Urine: <3 ug/mL

## 2020-11-24 LAB — CMP14+EGFR
ALT: 28 IU/L (ref 0–44)
AST: 26 IU/L (ref 0–40)
Albumin/Globulin Ratio: 1.8 (ref 1.2–2.2)
Albumin: 4.2 g/dL (ref 3.8–4.8)
Alkaline Phosphatase: 64 IU/L (ref 44–121)
BUN/Creatinine Ratio: 10 (ref 10–24)
BUN: 9 mg/dL (ref 8–27)
Bilirubin Total: 0.3 mg/dL (ref 0.0–1.2)
CO2: 23 mmol/L (ref 20–29)
Calcium: 9 mg/dL (ref 8.6–10.2)
Chloride: 103 mmol/L (ref 96–106)
Creatinine, Ser: 0.87 mg/dL (ref 0.76–1.27)
Globulin, Total: 2.3 g/dL (ref 1.5–4.5)
Glucose: 132 mg/dL — ABNORMAL HIGH (ref 70–99)
Potassium: 4.6 mmol/L (ref 3.5–5.2)
Sodium: 141 mmol/L (ref 134–144)
Total Protein: 6.5 g/dL (ref 6.0–8.5)
eGFR: 93 mL/min/{1.73_m2} (ref >59)

## 2020-11-24 LAB — LIPID PANEL
Chol/HDL Ratio: 2.7 ratio (ref 0.0–5.0)
Cholesterol, Total: 106 mg/dL (ref 100–199)
HDL: 40 mg/dL (ref >39)
LDL Chol Calc (NIH): 53 mg/dL (ref 0–99)
Triglycerides: 57 mg/dL (ref 0–149)
VLDL Cholesterol Cal: 13 mg/dL (ref 5–40)

## 2020-12-13 DIAGNOSIS — L570 Actinic keratosis: Secondary | ICD-10-CM | POA: Diagnosis not present

## 2020-12-14 ENCOUNTER — Telehealth: Payer: Self-pay | Admitting: Nurse Practitioner

## 2020-12-14 NOTE — Telephone Encounter (Signed)
Patient returned call He stated he was not interested in AWV

## 2020-12-14 NOTE — Telephone Encounter (Signed)
Left message for patient to call back and schedule Medicare Annual Wellness Visit (AWV) either virtually or phone I left my number for patient to call [phone].  *due 03/07/2017 awvi per [employer]   please schedule at anytime with health coach  This should be a 45 minute visit.

## 2021-01-08 ENCOUNTER — Ambulatory Visit (INDEPENDENT_AMBULATORY_CARE_PROVIDER_SITE_OTHER): Payer: Medicare PPO | Admitting: Nurse Practitioner

## 2021-01-08 ENCOUNTER — Encounter: Payer: Self-pay | Admitting: Nurse Practitioner

## 2021-01-08 DIAGNOSIS — E119 Type 2 diabetes mellitus without complications: Secondary | ICD-10-CM

## 2021-01-08 NOTE — Progress Notes (Signed)
   Virtual Visit  Note Due to COVID-19 pandemic this visit was conducted virtually. This visit type was conducted due to national recommendations for restrictions regarding the COVID-19 Pandemic (e.g. social distancing, sheltering in place) in an effort to limit this patient's exposure and mitigate transmission in our community. All issues noted in this document were discussed and addressed.  A physical exam was not performed with this format.  I connected with Rodney Glover on 01/08/21 at 8:56 by telephone and verified that I am speaking with the correct person using two identifiers. Rodney Glover is currently located at home and no one is currently with him during visit. The provider, Mary-Margaret Hassell Done, FNP is located in their office at time of visit.  I discussed the limitations, risks, security and privacy concerns of performing an evaluation and management service by telephone and the availability of in person appointments. I also discussed with the patient that there may be a patient responsible charge related to this service. The patient expressed understanding and agreed to proceed.   History and Present Illness:  Patient calls in having trouble with his blood sugars. He has been doing well. Except this past Friday he had a dizzy spell. So he checked his blood sugar and it was 270 Bp was 924'Q systolic. Since then his blood sugars have been normal runnig around 160-200. He is feeling fine since then.    Review of Systems  Constitutional:  Negative for diaphoresis and weight loss.  Eyes:  Negative for blurred vision, double vision and pain.  Respiratory:  Negative for shortness of breath.   Cardiovascular:  Negative for chest pain, palpitations, orthopnea and leg swelling.  Gastrointestinal:  Negative for abdominal pain.  Skin:  Negative for rash.  Neurological:  Positive for dizziness and tremors. Negative for sensory change, loss of consciousness, weakness and headaches.   Endo/Heme/Allergies:  Negative for polydipsia. Does not bruise/bleed easily.  Psychiatric/Behavioral:  Negative for memory loss. The patient does not have insomnia.   All other systems reviewed and are negative.   Observations/Objective: Alert and oriented- answers all questions appropriately No distress   Assessment and Plan: Rodney Glover in today with chief complaint of No chief complaint on file.   1. Type 2 diabetes mellitus without complication, unspecified whether long term insulin use ([place]) Continue to watch diet Continue to keep diary of blood sugars. If continues RTO sooner. Keep routine follow up appointment     Follow Up Instructions: 3 months    I discussed the assessment and treatment plan with the patient. The patient was provided an opportunity to ask questions and all were answered. The patient agreed with the plan and demonstrated an understanding of the instructions.   The patient was advised to call back or seek an in-person evaluation if the symptoms worsen or if the condition fails to improve as anticipated.  The above assessment and management plan was discussed with the patient. The patient verbalized understanding of and has agreed to the management plan. Patient is aware to call the clinic if symptoms persist or worsen. Patient is aware when to return to the clinic for a follow-up visit. Patient educated on when it is appropriate to go to the emergency department.   Time call ended:  9:10  I provided 12 minutes of  non face-to-face time during this encounter.    Mary-Margaret Hassell Done, FNP

## 2021-02-22 ENCOUNTER — Telehealth: Payer: Self-pay | Admitting: Nurse Practitioner

## 2021-02-22 DIAGNOSIS — E119 Type 2 diabetes mellitus without complications: Secondary | ICD-10-CM

## 2021-02-22 MED ORDER — ACCU-CHEK AVIVA PLUS VI STRP: ORAL_STRIP | 11 refills | Status: DC

## 2021-02-22 MED ORDER — BD SWAB SINGLE USE REGULAR PADS: MEDICATED_PAD | 3 refills | Status: AC

## 2021-02-22 MED ORDER — ACCU-CHEK SOFTCLIX LANCETS MISC: 3 refills | Status: AC

## 2021-02-22 NOTE — Telephone Encounter (Signed)
Done, pt aware.

## 2021-02-22 NOTE — Addendum Note (Signed)
Addended by: Antonietta Barcelona D on: 02/22/2021 04:02 PM   Modules accepted: Orders

## 2021-02-27 ENCOUNTER — Ambulatory Visit: Payer: Medicare PPO | Admitting: Nurse Practitioner

## 2021-02-27 ENCOUNTER — Encounter: Payer: Self-pay | Admitting: Nurse Practitioner

## 2021-02-27 VITALS — BP 138/79 | HR 60 | Temp 98.1°F | Resp 20 | Ht 67.0 in | Wt 157.0 lb

## 2021-02-27 DIAGNOSIS — Z23 Encounter for immunization: Secondary | ICD-10-CM

## 2021-02-27 DIAGNOSIS — E782 Mixed hyperlipidemia: Secondary | ICD-10-CM | POA: Diagnosis not present

## 2021-02-27 DIAGNOSIS — Z6828 Body mass index (BMI) 28.0-28.9, adult: Secondary | ICD-10-CM | POA: Diagnosis not present

## 2021-02-27 DIAGNOSIS — I10 Essential (primary) hypertension: Secondary | ICD-10-CM | POA: Diagnosis not present

## 2021-02-27 DIAGNOSIS — M25512 Pain in left shoulder: Secondary | ICD-10-CM | POA: Diagnosis not present

## 2021-02-27 DIAGNOSIS — E119 Type 2 diabetes mellitus without complications: Secondary | ICD-10-CM

## 2021-02-27 LAB — BAYER DCA HB A1C WAIVED: HB A1C (BAYER DCA - WAIVED): 8.2 % — ABNORMAL HIGH (ref 4.8–5.6)

## 2021-02-27 MED ORDER — AMLODIPINE BESY-BENAZEPRIL HCL 5-20 MG PO CAPS: 1.0000 | ORAL_CAPSULE | Freq: Every day | ORAL | 1 refills | Status: DC

## 2021-02-27 MED ORDER — METHYLPREDNISOLONE ACETATE 40 MG/ML IJ SUSP
40.0000 mg | Freq: Once | INTRAMUSCULAR | Status: AC
  Administered 2021-02-27: 09:00:00 40 mg via INTRAMUSCULAR

## 2021-02-27 MED ORDER — METFORMIN HCL 1000 MG PO TABS: ORAL_TABLET | ORAL | 1 refills | Status: DC

## 2021-02-27 MED ORDER — SITAGLIPTIN PHOSPHATE 100 MG PO TABS: 100.0000 mg | ORAL_TABLET | Freq: Every day | ORAL | 1 refills | Status: DC

## 2021-02-27 MED ORDER — ACCU-CHEK AVIVA PLUS VI STRP: ORAL_STRIP | 11 refills | Status: DC

## 2021-02-27 MED ORDER — LIDOCAINE HCL 2 % IJ SOLN
1.0000 mL | Freq: Once | INTRAMUSCULAR | Status: AC
  Administered 2021-02-27: 09:00:00 20 mg

## 2021-02-27 MED ORDER — GLIMEPIRIDE 4 MG PO TABS: 8.0000 mg | ORAL_TABLET | Freq: Every day | ORAL | 1 refills | Status: DC

## 2021-02-27 NOTE — Addendum Note (Signed)
Addended by: Rolena Infante on: 02/27/2021 02:17 PM   Modules accepted: Orders

## 2021-02-27 NOTE — Progress Notes (Signed)
Subjective:    Patient ID: Rodney Glover, male    DOB: 01/09/1951, 71 y.o.   MRN: [id number]   Chief Complaint: medical management of chronic issues     HPI:  Rodney Glover is a 71 y.o. who identifies as a male who was assigned male at birth.   Social history: Lives with: wife and daughter Work history: is in middle of building a new house   Comes in today for follow up of the following chronic medical issues:  1. Primary hypertension No c/o chest pain, sob or headache. Doe snot check blood pressure at home. BP Readings from Last 3 Encounters:  11/23/20 116/63  11/20/20 109/73  08/22/20 116/60     2. Mixed hyperlipidemia Does try to watch diet and stays very active. Walks golf course at least 2-3 times a week. Lab Results  Component Value Date   CHOL 106 11/23/2020   HDL 40 11/23/2020   LDLCALC 53 11/23/2020   TRIG 57 11/23/2020   CHOLHDL 2.7 11/23/2020   The ASCVD Risk score (Arnett DK, et al., 2019) failed to calculate for the following reasons:   The valid total cholesterol range is 130 to 320 mg/dL   3. Type 2 diabetes mellitus without complication, unspecified whether long term insulin use (HCC) He has not been checking his blood sugar the last week because he ran out of test strips. When he was checking them they were running around 130-160. Lab Results  Component Value Date   HGBA1C 7.4 (H) 11/23/2020     4. BMI 28.0-28.9,adult No recent weight changes Wt Readings from Last 3 Encounters:  02/27/21 157 lb (71.2 kg)  11/23/20 156 lb (70.8 kg)  11/20/20 150 lb (68 kg)   BMI Readings from Last 3 Encounters:  02/27/21 24.59 kg/m  11/23/20 24.43 kg/m  11/20/20 23.49 kg/m     New complaints: Left shoulder pain. Thinks may have hurt playing golf. Rates pain 3-4/10. Laying on it increases pain. Resting arm decreases pain. He has tried motrin an dtylenol with no relief.  Allergies  Allergen Reactions   Niaspan [Niacin Er] Itching    Penicillins     REACTION: hives   Outpatient Encounter Medications as of 02/27/2021  Medication Sig   Accu-Chek Softclix Lancets lancets Test BS daily Dx E11.9   Alcohol Swabs (B-D SINGLE USE SWABS REGULAR) PADS Test BS daily Dx E11.9   amLODipine-benazepril (LOTREL) 5-20 MG capsule Take 1 capsule by mouth daily.   aspirin 81 MG tablet Take 81 mg by mouth daily.   Blood Glucose Monitoring Suppl (ACCU-CHEK AVIVA PLUS) w/Device KIT Use to check blood sugar daily   Cholecalciferol (VITAMIN D-3) 5000 UNITS TABS Take 10,000 each by mouth in the morning and at bedtime.   Cinnamon 500 MG TABS Take by mouth 2 (two) times daily.   cyanocobalamin 1000 MCG tablet Take 1,000 mcg by mouth daily.   ezetimibe (ZETIA) 10 MG tablet Take 1 tablet (10 mg total) by mouth daily.   fish oil-omega-3 fatty acids 1000 MG capsule Take 2 g by mouth daily. 4 per day 1084m per day   glimepiride (AMARYL) 4 MG tablet Take 1 tablet (4 mg total) by mouth daily with breakfast.   glucose blood (ACCU-CHEK AVIVA PLUS) test strip Test 1x a day- dx 250.02   Melatonin 10 MG TABS    metFORMIN (GLUCOPHAGE) 1000 MG tablet TAKE 1 TABLET TWICE DAILY WITH MEALS   Multiple Vitamin (MULTIVITAMIN) capsule Take 1 capsule by mouth  daily.   rosuvastatin (CRESTOR) 20 MG tablet Take 1 tablet (20 mg total) by mouth daily.   sitaGLIPtin (JANUVIA) 100 MG tablet Take 1 tablet (100 mg total) by mouth daily.   No facility-administered encounter medications on file as of 02/27/2021.    Past Surgical History:  Procedure Laterality Date   APPENDECTOMY     COLONOSCOPY     HERNIA REPAIR     KNEE SURGERY Bilateral 12/07/2012   Dr. Tonita Cong   POLYPECTOMY      Family History  Problem Relation Age of Onset   Hyperlipidemia Mother    Heart disease Father    Cancer Father        bladder, and prostate   Stroke Father    Coronary artery disease Father    Prostate cancer Father    Colon cancer Father    Hyperlipidemia Sister    Hypertension  Sister    Prostate cancer Other    Diabetes Other    Heart disease Other    Esophageal cancer Neg Hx    Rectal cancer Neg Hx    Stomach cancer Neg Hx    Colon polyps Neg Hx       Controlled substance contract: n/a     Review of Systems  Constitutional:  Negative for diaphoresis.  Eyes:  Negative for pain.  Respiratory:  Negative for shortness of breath.   Cardiovascular:  Negative for chest pain, palpitations and leg swelling.  Gastrointestinal:  Negative for abdominal pain.  Endocrine: Negative for polydipsia.  Skin:  Negative for rash.  Neurological:  Negative for dizziness, weakness and headaches.  Hematological:  Does not bruise/bleed easily.  All other systems reviewed and are negative.     Objective:   Physical Exam Vitals and nursing note reviewed.  Constitutional:      Appearance: Normal appearance. He is well-developed.  HENT:     Head: Normocephalic.     Nose: Nose normal.     Mouth/Throat:     Mouth: Mucous membranes are moist.     Pharynx: Oropharynx is clear.  Eyes:     Pupils: Pupils are equal, round, and reactive to light.  Neck:     Thyroid: No thyroid mass or thyromegaly.     Vascular: No carotid bruit or JVD.     Trachea: Phonation normal.  Cardiovascular:     Rate and Rhythm: Normal rate and regular rhythm.  Pulmonary:     Effort: Pulmonary effort is normal. No respiratory distress.     Breath sounds: Normal breath sounds.  Abdominal:     General: Bowel sounds are normal.     Palpations: Abdomen is soft.     Tenderness: There is no abdominal tenderness.  Musculoskeletal:        General: Normal range of motion.     Cervical back: Normal range of motion and neck supple.     Comments: From of left shoulder with pain on full extension No pint tenderness Grips equal bil Motor strength and sensation distally intact  Lymphadenopathy:     Cervical: No cervical adenopathy.  Skin:    General: Skin is warm and dry.  Neurological:     Mental  Status: He is alert and oriented to person, place, and time.  Psychiatric:        Behavior: Behavior normal.        Thought Content: Thought content normal.        Judgment: Judgment normal.    BP 138/79  Pulse 60    Temp 98.1 F (36.7 C) (Temporal)    Resp 20    Ht '5\' 7"'  (1.702 m)    Wt 157 lb (71.2 kg)    SpO2 98%    BMI 24.59 kg/m   Hgba1c 8.2%  Joint Injection/Arthrocentesis  Date/Time: 02/27/2021 9:17 AM Performed by: Chevis Pretty, FNP Authorized by: Hassell Done Mary-Margaret, FNP  Indications: joint swelling  Body area: shoulder Local anesthesia used: no  Anesthesia: Local anesthesia used: no  Sedation: Patient sedated: no  Preparation: Patient was prepped and draped in the usual sterile fashion. Needle size: 22 G Ultrasound guidance: no Approach: posterior Methylprednisolone amount: 40 mg Lidocaine 2% amount: 1 mL Patient tolerance: patient tolerated the procedure well with no immediate complications       Assessment & Plan:   Rodney Glover comes in today with chief complaint of Medical Management of Chronic Issues (Left shoulder pain. Started hurting in November)   Diagnosis and orders addressed:  1. Primary hypertension Low sodium diet - CBC with Differential/Platelet - CMP14+EGFR - amLODipine-benazepril (LOTREL) 5-20 MG capsule; Take 1 capsule by mouth daily.  Dispense: 90 capsule; Refill: 1  2. Mixed hyperlipidemia Low fat diet - Lipid panel  3. Type 2 diabetes mellitus without complication, unspecified whether long term insulin use (HCC) Increase amaryl to 33m daily - Bayer DCA Hb A1c Waived - glimepiride (AMARYL) 4 MG tablet; Take 2 tablets (8 mg total) by mouth daily with breakfast.  Dispense: 180 tablet; Refill: 1 - sitaGLIPtin (JANUVIA) 100 MG tablet; Take 1 tablet (100 mg total) by mouth daily.  Dispense: 90 tablet; Refill: 1 - metFORMIN (GLUCOPHAGE) 1000 MG tablet; TAKE 1 TABLET TWICE DAILY WITH MEALS  Dispense: 180 tablet;  Refill: 1 - glucose blood (ACCU-CHEK AVIVA PLUS) test strip; Test 1x a day- dx 250.02  Dispense: 300 each; Refill: 11  4. BMI 28.0-28.9,adult Discussed diet and exercise for person with BMI >25 Will recheck weight in 3-6 months   5. Acute pain of left shoulder Shoulder injection Ice bid Rest If no improvement will need to see ortho - lidocaine (XYLOCAINE) 2 % (with pres) injection 20 mg - methylPREDNISolone acetate (DEPO-MEDROL) injection 40 mg   Labs pending Health Maintenance reviewed Diet and exercise encouraged  Follow up plan: 3 months   Mary-Margaret MHassell Done FNP

## 2021-02-27 NOTE — Patient Instructions (Signed)

## 2021-02-28 LAB — CBC WITH DIFFERENTIAL/PLATELET
Basophils Absolute: 0.1 10*3/uL (ref 0.0–0.2)
Basos: 1 %
EOS (ABSOLUTE): 0.3 10*3/uL (ref 0.0–0.4)
Eos: 5 %
Hematocrit: 41.9 % (ref 37.5–51.0)
Hemoglobin: 14.6 g/dL (ref 13.0–17.7)
Immature Grans (Abs): 0 10*3/uL (ref 0.0–0.1)
Immature Granulocytes: 0 %
Lymphocytes Absolute: 2.5 10*3/uL (ref 0.7–3.1)
Lymphs: 36 %
MCH: 31.1 pg (ref 26.6–33.0)
MCHC: 34.8 g/dL (ref 31.5–35.7)
MCV: 89 fL (ref 79–97)
Monocytes Absolute: 0.6 10*3/uL (ref 0.1–0.9)
Monocytes: 8 %
Neutrophils Absolute: 3.5 10*3/uL (ref 1.4–7.0)
Neutrophils: 50 %
Platelets: 237 10*3/uL (ref 150–450)
RBC: 4.69 x10E6/uL (ref 4.14–5.80)
RDW: 12.8 % (ref 11.6–15.4)
WBC: 6.9 10*3/uL (ref 3.4–10.8)

## 2021-02-28 LAB — CMP14+EGFR
ALT: 34 IU/L (ref 0–44)
AST: 27 IU/L (ref 0–40)
Albumin/Globulin Ratio: 2.1 (ref 1.2–2.2)
Albumin: 4.6 g/dL (ref 3.8–4.8)
Alkaline Phosphatase: 79 IU/L (ref 44–121)
BUN/Creatinine Ratio: 12 (ref 10–24)
BUN: 11 mg/dL (ref 8–27)
Bilirubin Total: 0.3 mg/dL (ref 0.0–1.2)
CO2: 26 mmol/L (ref 20–29)
Calcium: 9.3 mg/dL (ref 8.6–10.2)
Chloride: 103 mmol/L (ref 96–106)
Creatinine, Ser: 0.91 mg/dL (ref 0.76–1.27)
Globulin, Total: 2.2 g/dL (ref 1.5–4.5)
Glucose: 129 mg/dL — ABNORMAL HIGH (ref 70–99)
Potassium: 4.9 mmol/L (ref 3.5–5.2)
Sodium: 143 mmol/L (ref 134–144)
Total Protein: 6.8 g/dL (ref 6.0–8.5)
eGFR: 91 mL/min/{1.73_m2} (ref >59)

## 2021-02-28 LAB — LIPID PANEL
Chol/HDL Ratio: 2.7 ratio (ref 0.0–5.0)
Cholesterol, Total: 116 mg/dL (ref 100–199)
HDL: 43 mg/dL (ref >39)
LDL Chol Calc (NIH): 59 mg/dL (ref 0–99)
Triglycerides: 63 mg/dL (ref 0–149)
VLDL Cholesterol Cal: 14 mg/dL (ref 5–40)

## 2021-03-08 ENCOUNTER — Other Ambulatory Visit: Payer: Self-pay

## 2021-03-08 DIAGNOSIS — Z1212 Encounter for screening for malignant neoplasm of rectum: Secondary | ICD-10-CM

## 2021-03-08 DIAGNOSIS — Z1211 Encounter for screening for malignant neoplasm of colon: Secondary | ICD-10-CM

## 2021-03-16 LAB — COLOGUARD

## 2021-06-05 DIAGNOSIS — L570 Actinic keratosis: Secondary | ICD-10-CM | POA: Diagnosis not present

## 2021-06-07 ENCOUNTER — Encounter: Payer: Self-pay | Admitting: Nurse Practitioner

## 2021-06-07 ENCOUNTER — Ambulatory Visit: Payer: Medicare PPO | Admitting: Nurse Practitioner

## 2021-06-07 VITALS — BP 125/68 | HR 65 | Temp 98.3°F | Resp 20 | Ht 67.0 in | Wt 153.0 lb

## 2021-06-07 DIAGNOSIS — E119 Type 2 diabetes mellitus without complications: Secondary | ICD-10-CM | POA: Diagnosis not present

## 2021-06-07 DIAGNOSIS — Z86010 Personal history of colonic polyps: Secondary | ICD-10-CM | POA: Diagnosis not present

## 2021-06-07 DIAGNOSIS — Z23 Encounter for immunization: Secondary | ICD-10-CM

## 2021-06-07 DIAGNOSIS — Z6823 Body mass index (BMI) 23.0-23.9, adult: Secondary | ICD-10-CM

## 2021-06-07 DIAGNOSIS — I10 Essential (primary) hypertension: Secondary | ICD-10-CM

## 2021-06-07 DIAGNOSIS — E782 Mixed hyperlipidemia: Secondary | ICD-10-CM

## 2021-06-07 LAB — BAYER DCA HB A1C WAIVED: HB A1C (BAYER DCA - WAIVED): 7.8 % — ABNORMAL HIGH (ref 4.8–5.6)

## 2021-06-07 MED ORDER — SITAGLIPTIN PHOSPHATE 100 MG PO TABS: 100.0000 mg | ORAL_TABLET | Freq: Every day | ORAL | 1 refills | Status: DC

## 2021-06-07 MED ORDER — EZETIMIBE 10 MG PO TABS: 10.0000 mg | ORAL_TABLET | Freq: Every day | ORAL | 1 refills | Status: DC

## 2021-06-07 MED ORDER — AMLODIPINE BESY-BENAZEPRIL HCL 5-20 MG PO CAPS: 1.0000 | ORAL_CAPSULE | Freq: Every day | ORAL | 1 refills | Status: DC

## 2021-06-07 MED ORDER — ROSUVASTATIN CALCIUM 20 MG PO TABS: 20.0000 mg | ORAL_TABLET | Freq: Every day | ORAL | 1 refills | Status: DC

## 2021-06-07 MED ORDER — GLIMEPIRIDE 4 MG PO TABS: 8.0000 mg | ORAL_TABLET | Freq: Every day | ORAL | 1 refills | Status: DC

## 2021-06-07 MED ORDER — METFORMIN HCL 1000 MG PO TABS: ORAL_TABLET | ORAL | 1 refills | Status: DC

## 2021-06-07 NOTE — Addendum Note (Signed)
Addended by: Rolena Infante on: 06/07/2021 03:21 PM ? ? Modules accepted: Orders ? ?

## 2021-06-07 NOTE — Progress Notes (Signed)
? ?Subjective:  ? ? Patient ID: Rodney Glover, male    DOB: 11-19-1950, 71 y.o.   MRN: [id number] ? ? ?Chief Complaint: Medical Management of Chronic Issues (Muscle cramps at night time for almost a year) ?  ? ?HPI: ? ?Rodney Glover is a 71 y.o. who identifies as a male who was assigned male at birth.  ? ?Social history: ?Lives with: wife and daughter ?Work history: retired ? ? ?Comes in today for follow up of the following chronic medical issues: ? ?1. Primary hypertension ?No c/o chest pan sob or headache. Does not check blood pressure at home. ?BP Readings from Last 3 Encounters:  ?06/07/21 125/68  ?02/27/21 138/79  ?11/23/20 116/63  ? ? ? ?2. Mixed hyperlipidemia ?Does watch diet and stays very active. Plays golf 2-3 x a week. ?Lab Results  ?Component Value Date  ? CHOL 116 02/27/2021  ? HDL 43 02/27/2021  ? [place] 59 02/27/2021  ? TRIG 63 02/27/2021  ? CHOLHDL 2.7 02/27/2021  ?The ASCVD Risk score (Arnett DK, et al., 2019) failed to calculate for the following reasons: ?  The valid total cholesterol range is 130 to 320 mg/dL ? ? ? ?3. Type 2 diabetes mellitus without complication, unspecified whether long term insulin use ([place]) ?Fasting blood sugars are running around 110-150. We increased his amaryl at last visit. ?Lab Results  ?Component Value Date  ? HGBA1C 8.2 (H) 02/27/2021  ? ? ? ?4. Hx of adenomatous colonic polyps ?Colonoscopy due in 2027. ? ?5. BMI 24.0-24.9,adult ?Weight is down 4lbs ?Wt Readings from Last 3 Encounters:  ?06/07/21 153 lb (69.4 kg)  ?02/27/21 157 lb (71.2 kg)  ?11/23/20 156 lb (70.8 kg)  ? ?BMI Readings from Last 3 Encounters:  ?06/07/21 23.96 kg/m?  ?02/27/21 24.59 kg/m?  ?11/23/20 24.43 kg/m?  ? ? ? ? ?New complaints: ?None today ? ?Allergies  ?Allergen Reactions  ? Niaspan [Niacin Er] Itching  ? Penicillins   ?  REACTION: hives  ? ?Outpatient Encounter Medications as of 06/07/2021  ?Medication Sig  ? Accu-Chek Softclix Lancets lancets Test BS daily Dx E11.9  ? Alcohol Swabs (B-D  SINGLE USE SWABS REGULAR) PADS Test BS daily Dx E11.9  ? amLODipine-benazepril (LOTREL) 5-20 MG capsule Take 1 capsule by mouth daily.  ? aspirin 81 MG tablet Take 81 mg by mouth daily.  ? Blood Glucose Monitoring Suppl (ACCU-CHEK AVIVA PLUS) w/Device KIT Use to check blood sugar daily  ? Cholecalciferol (VITAMIN D-3) 5000 UNITS TABS Take 10,000 each by mouth in the morning and at bedtime.  ? Cinnamon 500 MG TABS Take by mouth 2 (two) times daily.  ? cyanocobalamin 1000 MCG tablet Take 1,000 mcg by mouth daily.  ? ezetimibe (ZETIA) 10 MG tablet Take 1 tablet (10 mg total) by mouth daily.  ? fish oil-omega-3 fatty acids 1000 MG capsule Take 2 g by mouth daily. 4 per day 1044m per day  ? glimepiride (AMARYL) 4 MG tablet Take 2 tablets (8 mg total) by mouth daily with breakfast.  ? glucose blood (ACCU-CHEK AVIVA PLUS) test strip Test 1x a day- dx 250.02  ? Melatonin 10 MG TABS   ? metFORMIN (GLUCOPHAGE) 1000 MG tablet TAKE 1 TABLET TWICE DAILY WITH MEALS  ? Multiple Vitamin (MULTIVITAMIN) capsule Take 1 capsule by mouth daily.  ? rosuvastatin (CRESTOR) 20 MG tablet Take 1 tablet (20 mg total) by mouth daily.  ? sitaGLIPtin (JANUVIA) 100 MG tablet Take 1 tablet (100 mg total) by mouth daily.  ?  TURMERIC PO Take by mouth.  ? ?No facility-administered encounter medications on file as of 06/07/2021.  ? ? ?Past Surgical History:  ?Procedure Laterality Date  ? APPENDECTOMY    ? COLONOSCOPY    ? HERNIA REPAIR    ? KNEE SURGERY Bilateral 12/07/2012  ? Dr. Tonita Cong  ? POLYPECTOMY    ? ? ?Family History  ?Problem Relation Age of Onset  ? Hyperlipidemia Mother   ? Heart disease Father   ? Cancer Father   ?     bladder, and prostate  ? Stroke Father   ? Coronary artery disease Father   ? Prostate cancer Father   ? Colon cancer Father   ? Hyperlipidemia Sister   ? Hypertension Sister   ? Prostate cancer Other   ? Diabetes Other   ? Heart disease Other   ? Esophageal cancer Neg Hx   ? Rectal cancer Neg Hx   ? Stomach cancer Neg Hx   ?  Colon polyps Neg Hx   ? ? ? ? ?Controlled substance contract: n/a ? ? ? ? ?Review of Systems  ?Constitutional:  Negative for diaphoresis.  ?Eyes:  Negative for pain.  ?Respiratory:  Negative for shortness of breath.   ?Cardiovascular:  Negative for chest pain, palpitations and leg swelling.  ?Gastrointestinal:  Negative for abdominal pain.  ?Endocrine: Negative for polydipsia.  ?Skin:  Negative for rash.  ?Neurological:  Negative for dizziness, weakness and headaches.  ?Hematological:  Does not bruise/bleed easily.  ?All other systems reviewed and are negative. ? ?   ?Objective:  ? Physical Exam ?Vitals and nursing note reviewed.  ?Constitutional:   ?   Appearance: Normal appearance. He is well-developed.  ?HENT:  ?   Head: Normocephalic.  ?   Nose: Nose normal.  ?   Mouth/Throat:  ?   Mouth: Mucous membranes are moist.  ?   Pharynx: Oropharynx is clear.  ?Eyes:  ?   Pupils: Pupils are equal, round, and reactive to light.  ?Neck:  ?   Thyroid: No thyroid mass or thyromegaly.  ?   Vascular: No carotid bruit or JVD.  ?   Trachea: Phonation normal.  ?Cardiovascular:  ?   Rate and Rhythm: Normal rate and regular rhythm.  ?Pulmonary:  ?   Effort: Pulmonary effort is normal. No respiratory distress.  ?   Breath sounds: Normal breath sounds.  ?Abdominal:  ?   General: Bowel sounds are normal.  ?   Palpations: Abdomen is soft.  ?   Tenderness: There is no abdominal tenderness.  ?Musculoskeletal:     ?   General: Normal range of motion.  ?   Cervical back: Normal range of motion and neck supple.  ?Lymphadenopathy:  ?   Cervical: No cervical adenopathy.  ?Skin: ?   General: Skin is warm and dry.  ?Neurological:  ?   Mental Status: He is alert and oriented to person, place, and time.  ?Psychiatric:     ?   Behavior: Behavior normal.     ?   Thought Content: Thought content normal.     ?   Judgment: Judgment normal.  ? ? ?BP 125/68   Pulse 65   Temp 98.3 ?F (36.8 ?C) (Temporal)   Resp 20   Ht '5\' 7"'  (1.702 m)   Wt 153 lb  (69.4 kg)   SpO2 99%   BMI 23.96 kg/m?  ? ?HGBA1c 7.8% ? ?   ?Assessment & Plan:  ?Rodney Glover comes in today  with chief complaint of Medical Management of Chronic Issues (Muscle cramps at night time for almost a year) ? ? ?Diagnosis and orders addressed: ? ?1. Primary hypertension ?Low sodium diet ?- CBC with Differential/Platelet ?- CMP14+EGFR ?- amLODipine-benazepril (LOTREL) 5-20 MG capsule; Take 1 capsule by mouth daily.  Dispense: 90 capsule; Refill: 1 ? ?2. Mixed hyperlipidemia ?[place] diet ?- Lipid panel ?- rosuvastatin (CRESTOR) 20 MG tablet; Take 1 tablet (20 mg total) by mouth daily.  Dispense: 90 tablet; Refill: 1 ?- ezetimibe (ZETIA) 10 MG tablet; Take 1 tablet (10 mg total) by mouth daily.  Dispense: 90 tablet; Refill: 1 ? ?3. Type 2 diabetes mellitus without complication, unspecified whether long term insulin use ([place]) ?Continue to wtatch carbs in diet ?- Bayer DCA Hb A1c Waived ?- glimepiride (AMARYL) 4 MG tablet; Take 2 tablets (8 mg total) by mouth daily with breakfast.  Dispense: 180 tablet; Refill: 1 ?- sitaGLIPtin (JANUVIA) 100 MG tablet; Take 1 tablet (100 mg total) by mouth daily.  Dispense: 90 tablet; Refill: 1 ?- metFORMIN (GLUCOPHAGE) 1000 MG tablet; TAKE 1 TABLET TWICE DAILY WITH MEALS  Dispense: 180 tablet; Refill: 1 ? ?4. Hx of adenomatous colonic polyps ?Will repeat colonoscopy when it is time ? ?5. BMI 23.0-23.9, adult ?Discussed diet and exercise for person with BMI >25 ?Will recheck weight in 3-6 months ? ? ? ?Labs pending ?Health Maintenance reviewed ?Diet and exercise encouraged ? ?Follow up plan: ?3 months ? ? ?Mary-Margaret Hassell Done, FNP ? ? ?

## 2021-06-07 NOTE — Patient Instructions (Signed)

## 2021-06-08 LAB — CMP14+EGFR
ALT: 27 IU/L (ref 0–44)
AST: 26 IU/L (ref 0–40)
Albumin/Globulin Ratio: 1.9 (ref 1.2–2.2)
Albumin: 4.4 g/dL (ref 3.8–4.8)
Alkaline Phosphatase: 68 IU/L (ref 44–121)
BUN/Creatinine Ratio: 10 (ref 10–24)
BUN: 8 mg/dL (ref 8–27)
Bilirubin Total: 0.4 mg/dL (ref 0.0–1.2)
CO2: 23 mmol/L (ref 20–29)
Calcium: 9.4 mg/dL (ref 8.6–10.2)
Chloride: 101 mmol/L (ref 96–106)
Creatinine, Ser: 0.82 mg/dL (ref 0.76–1.27)
Globulin, Total: 2.3 g/dL (ref 1.5–4.5)
Glucose: 137 mg/dL — ABNORMAL HIGH (ref 70–99)
Potassium: 5.1 mmol/L (ref 3.5–5.2)
Sodium: 139 mmol/L (ref 134–144)
Total Protein: 6.7 g/dL (ref 6.0–8.5)
eGFR: 94 mL/min/{1.73_m2} (ref >59)

## 2021-06-08 LAB — LIPID PANEL
Chol/HDL Ratio: 2.5 ratio (ref 0.0–5.0)
Cholesterol, Total: 107 mg/dL (ref 100–199)
HDL: 43 mg/dL (ref >39)
LDL Chol Calc (NIH): 51 mg/dL (ref 0–99)
Triglycerides: 56 mg/dL (ref 0–149)
VLDL Cholesterol Cal: 13 mg/dL (ref 5–40)

## 2021-06-08 LAB — CBC WITH DIFFERENTIAL/PLATELET
Basophils Absolute: 0.1 10*3/uL (ref 0.0–0.2)
Basos: 1 %
EOS (ABSOLUTE): 0.3 10*3/uL (ref 0.0–0.4)
Eos: 5 %
Hematocrit: 40 % (ref 37.5–51.0)
Hemoglobin: 13.8 g/dL (ref 13.0–17.7)
Immature Grans (Abs): 0 10*3/uL (ref 0.0–0.1)
Immature Granulocytes: 0 %
Lymphocytes Absolute: 1.8 10*3/uL (ref 0.7–3.1)
Lymphs: 30 %
MCH: 30.4 pg (ref 26.6–33.0)
MCHC: 34.5 g/dL (ref 31.5–35.7)
MCV: 88 fL (ref 79–97)
Monocytes Absolute: 0.5 10*3/uL (ref 0.1–0.9)
Monocytes: 9 %
Neutrophils Absolute: 3.4 10*3/uL (ref 1.4–7.0)
Neutrophils: 55 %
Platelets: 229 10*3/uL (ref 150–450)
RBC: 4.54 x10E6/uL (ref 4.14–5.80)
RDW: 12.6 % (ref 11.6–15.4)
WBC: 6.1 10*3/uL (ref 3.4–10.8)

## 2021-07-03 LAB — HM DIABETES EYE EXAM

## 2021-09-11 ENCOUNTER — Encounter: Payer: Self-pay | Admitting: Nurse Practitioner

## 2021-09-11 ENCOUNTER — Ambulatory Visit: Payer: Medicare PPO | Admitting: Nurse Practitioner

## 2021-09-11 VITALS — BP 135/81 | HR 63 | Temp 98.2°F | Resp 20 | Ht 67.0 in | Wt 157.0 lb

## 2021-09-11 DIAGNOSIS — I10 Essential (primary) hypertension: Secondary | ICD-10-CM

## 2021-09-11 DIAGNOSIS — Z125 Encounter for screening for malignant neoplasm of prostate: Secondary | ICD-10-CM | POA: Diagnosis not present

## 2021-09-11 DIAGNOSIS — E119 Type 2 diabetes mellitus without complications: Secondary | ICD-10-CM

## 2021-09-11 DIAGNOSIS — Z6828 Body mass index (BMI) 28.0-28.9, adult: Secondary | ICD-10-CM

## 2021-09-11 DIAGNOSIS — Z23 Encounter for immunization: Secondary | ICD-10-CM

## 2021-09-11 DIAGNOSIS — E782 Mixed hyperlipidemia: Secondary | ICD-10-CM | POA: Diagnosis not present

## 2021-09-11 LAB — BAYER DCA HB A1C WAIVED: HB A1C (BAYER DCA - WAIVED): 7 % — ABNORMAL HIGH (ref 4.8–5.6)

## 2021-09-11 NOTE — Patient Instructions (Signed)
Diabetes Mellitus and Foot Care Foot care is an important part of your health, especially when you have diabetes. Diabetes may cause you to have problems because of poor blood flow (circulation) to your feet and legs, which can cause your skin to: Become thinner and drier. Break more easily. Heal more slowly. Peel and crack. You may also have nerve damage (neuropathy) in your legs and feet, causing decreased feeling in them. This means that you may not notice minor injuries to your feet that could lead to more serious problems. Noticing and addressing any potential problems early is the best way to prevent future foot problems. How to care for your feet Foot hygiene  Wash your feet daily with warm water and mild soap. Do not use hot water. Then, pat your feet and the areas between your toes until they are completely dry. Do not soak your feet as this can dry your skin. Trim your toenails straight across. Do not dig under them or around the cuticle. File the edges of your nails with an emery board or nail file. Apply a moisturizing lotion or petroleum jelly to the skin on your feet and to dry, brittle toenails. Use lotion that does not contain alcohol and is unscented. Do not apply lotion between your toes. Shoes and socks Wear clean socks or stockings every day. Make sure they are not too tight. Do not wear knee-high stockings since they may decrease blood flow to your legs. Wear shoes that fit properly and have enough cushioning. Always look in your shoes before you put them on to be sure there are no objects inside. To break in new shoes, wear them for just a few hours a day. This prevents injuries on your feet. Wounds, scrapes, corns, and calluses  Check your feet daily for blisters, cuts, bruises, sores, and redness. If you cannot see the bottom of your feet, use a mirror or ask someone for help. Do not cut corns or calluses or try to remove them with medicine. If you find a minor scrape,  cut, or break in the skin on your feet, keep it and the skin around it clean and dry. You may clean these areas with mild soap and water. Do not clean the area with peroxide, alcohol, or iodine. If you have a wound, scrape, corn, or callus on your foot, look at it several times a day to make sure it is healing and not infected. Check for: Redness, swelling, or pain. Fluid or blood. Warmth. Pus or a bad smell. General tips Do not cross your legs. This may decrease blood flow to your feet. Do not use heating pads or hot water bottles on your feet. They may burn your skin. If you have lost feeling in your feet or legs, you may not know this is happening until it is too late. Protect your feet from hot and cold by wearing shoes, such as at the beach or on hot pavement. Schedule a complete foot exam at least once a year (annually) or more often if you have foot problems. Report any cuts, sores, or bruises to your health care provider immediately. Where to find more information American Diabetes Association: www.diabetes.org Association of Diabetes Care & Education Specialists: www.diabeteseducator.org Contact a health care provider if: You have a medical condition that increases your risk of infection and you have any cuts, sores, or bruises on your feet. You have an injury that is not healing. You have redness on your legs or feet. You   feel burning or tingling in your legs or feet. You have pain or cramps in your legs and feet. Your legs or feet are numb. Your feet always feel cold. You have pain around any toenails. Get help right away if: You have a wound, scrape, corn, or callus on your foot and: You have pain, swelling, or redness that gets worse. You have fluid or blood coming from the wound, scrape, corn, or callus. Your wound, scrape, corn, or callus feels warm to the touch. You have pus or a bad smell coming from the wound, scrape, corn, or callus. You have a fever. You have a red  line going up your leg. Summary Check your feet every day for blisters, cuts, bruises, sores, and redness. Apply a moisturizing lotion or petroleum jelly to the skin on your feet and to dry, brittle toenails. Wear shoes that fit properly and have enough cushioning. If you have foot problems, report any cuts, sores, or bruises to your health care provider immediately. Schedule a complete foot exam at least once a year (annually) or more often if you have foot problems. This information is not intended to replace advice given to you by your health care provider. Make sure you discuss any questions you have with your health care provider. Document Revised: 08/12/2019 Document Reviewed: 08/12/2019 Elsevier Patient Education  2023 Elsevier Inc.  

## 2021-09-11 NOTE — Addendum Note (Signed)
Addended by: Rolena Infante on: 09/11/2021 01:33 PM   Modules accepted: Orders

## 2021-09-11 NOTE — Progress Notes (Signed)
Subjective:    Patient ID: Rodney Glover, male    DOB: 1950-05-28, 71 y.o.   MRN: [id number]   Chief Complaint: medical management of chronic issues     HPI:  Rodney Glover is a 71 y.o. who identifies as a male who was assigned male at birth.   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 Primary hypertension No c/o chest pain, sob or headache. Does not check blood pressure at home. BP Readings from Last 3 Encounters:  06/07/21 125/68  02/27/21 138/79  11/23/20 116/63     2. Mixed hyperlipidemia Does try to watch diet and walks 18 holes on golf course 3-4x a week. Lab Results  Component Value Date   CHOL 107 06/07/2021   HDL 43 06/07/2021   LDLCALC 51 06/07/2021   TRIG 56 06/07/2021   CHOLHDL 2.5 06/07/2021   The ASCVD Risk score (Arnett DK, et al., 2019) failed to calculate for the following reasons:   The valid total cholesterol range is 130 to 320 mg/dL   3. Type 2 diabetes mellitus without complication, unspecified whether long term insulin use (HCC) Fasting blood sugars are running around 110-170. No low blood sugars  4. BMI 28.0-28.9,adult Weight is up 4 lbs Wt Readings from Last 3 Encounters:  09/11/21 157 lb (71.2 kg)  06/07/21 153 lb (69.4 kg)  02/27/21 157 lb (71.2 kg)   BMI Readings from Last 3 Encounters:  09/11/21 24.59 kg/m  06/07/21 23.96 kg/m  02/27/21 24.59 kg/m     New complaints: None today  Allergies  Allergen Reactions   Niaspan [Niacin Er] Itching   Penicillins     REACTION: hives   Outpatient Encounter Medications as of 09/11/2021  Medication Sig   Accu-Chek Softclix Lancets lancets Test BS daily Dx E11.9   Alcohol Swabs (B-D SINGLE USE SWABS REGULAR) PADS Test BS daily Dx E11.9   amLODipine-benazepril (LOTREL) 5-20 MG capsule Take 1 capsule by mouth daily.   aspirin 81 MG tablet Take 81 mg by mouth daily.   Blood Glucose  Monitoring Suppl (ACCU-CHEK AVIVA PLUS) w/Device KIT Use to check blood sugar daily   Cholecalciferol (VITAMIN D-3) 5000 UNITS TABS Take 10,000 each by mouth in the morning and at bedtime.   Cinnamon 500 MG TABS Take by mouth 2 (two) times daily.   cyanocobalamin 1000 MCG tablet Take 1,000 mcg by mouth daily.   ezetimibe (ZETIA) 10 MG tablet Take 1 tablet (10 mg total) by mouth daily.   fish oil-omega-3 fatty acids 1000 MG capsule Take 2 g by mouth daily. 4 per day 1076m per day   glimepiride (AMARYL) 4 MG tablet Take 2 tablets (8 mg total) by mouth daily with breakfast.   glucose blood (ACCU-CHEK AVIVA PLUS) test strip Test 1x a day- dx 250.02   Melatonin 10 MG TABS    metFORMIN (GLUCOPHAGE) 1000 MG tablet TAKE 1 TABLET TWICE DAILY WITH MEALS   Multiple Vitamin (MULTIVITAMIN) capsule Take 1 capsule by mouth daily.   rosuvastatin (CRESTOR) 20 MG tablet Take 1 tablet (20 mg total) by mouth daily.   sitaGLIPtin (JANUVIA) 100 MG tablet Take 1 tablet (100 mg total) by mouth daily.   TURMERIC PO Take by mouth.   No facility-administered encounter medications on file as of 09/11/2021.    Past Surgical History:  Procedure Laterality Date   APPENDECTOMY     COLONOSCOPY  HERNIA REPAIR     KNEE SURGERY Bilateral 12/07/2012   Dr. Tonita Cong   POLYPECTOMY      Family History  Problem Relation Age of Onset   Hyperlipidemia Mother    Heart disease Father    Cancer Father        bladder, and prostate   Stroke Father    Coronary artery disease Father    Prostate cancer Father    Colon cancer Father    Hyperlipidemia Sister    Hypertension Sister    Prostate cancer Other    Diabetes Other    Heart disease Other    Esophageal cancer Neg Hx    Rectal cancer Neg Hx    Stomach cancer Neg Hx    Colon polyps Neg Hx       Controlled substance contract: n/a     Review of Systems  Constitutional:  Negative for diaphoresis.  Eyes:  Negative for pain.  Respiratory:  Negative for shortness  of breath.   Cardiovascular:  Negative for chest pain, palpitations and leg swelling.  Gastrointestinal:  Negative for abdominal pain.  Endocrine: Negative for polydipsia.  Skin:  Negative for rash.  Neurological:  Negative for dizziness, weakness and headaches.  Hematological:  Does not bruise/bleed easily.  All other systems reviewed and are negative.      Objective:   Physical Exam Vitals and nursing note reviewed.  Constitutional:      Appearance: Normal appearance. He is well-developed.  HENT:     Head: Normocephalic.     Nose: Nose normal.     Mouth/Throat:     Mouth: Mucous membranes are moist.     Pharynx: Oropharynx is clear.  Eyes:     Pupils: Pupils are equal, round, and reactive to light.  Neck:     Thyroid: No thyroid mass or thyromegaly.     Vascular: No carotid bruit or JVD.     Trachea: Phonation normal.  Cardiovascular:     Rate and Rhythm: Normal rate and regular rhythm.  Pulmonary:     Effort: Pulmonary effort is normal. No respiratory distress.     Breath sounds: Normal breath sounds.  Abdominal:     General: Bowel sounds are normal.     Palpations: Abdomen is soft.     Tenderness: There is no abdominal tenderness.  Musculoskeletal:        General: Normal range of motion.     Cervical back: Normal range of motion and neck supple.  Lymphadenopathy:     Cervical: No cervical adenopathy.  Skin:    General: Skin is warm and dry.  Neurological:     Mental Status: He is alert and oriented to person, place, and time.  Psychiatric:        Behavior: Behavior normal.        Thought Content: Thought content normal.        Judgment: Judgment normal.    BP 135/81   Pulse 63   Temp 98.2 F (36.8 C) (Temporal)   Resp 20   Ht 5' 7" (1.702 m)   Wt 157 lb (71.2 kg)   SpO2 98%   BMI 24.59 kg/m   HGBA1c 7.0%       Assessment & Plan:   Rodney Glover comes in today with chief complaint of Medical Management of Chronic Issues   Diagnosis and  orders addressed:  1. Primary hypertension Low sodium diet - CBC with Differential/Platelet - CMP14+EGFR  2. Mixed hyperlipidemia Low fat  diet - Lipid panel  3. Type 2 diabetes mellitus without complication, unspecified whether long term insulin use (HCC) Continue to watch carbs in diet - Bayer DCA Hb A1c Waived  4. BMI 28.0-28.9,adult Discussed diet and exercise for person with BMI >25 Will recheck weight in 3-6 months   5. Screening for prostate cancer Labs pending - PSA, total and free   Labs pending Health Maintenance reviewed Diet and exercise encouraged  Follow up plan: 3 months   [place], FNP

## 2021-09-12 LAB — LIPID PANEL
Chol/HDL Ratio: 2.5 ratio (ref 0.0–5.0)
Cholesterol, Total: 127 mg/dL (ref 100–199)
HDL: 50 mg/dL (ref >39)
LDL Chol Calc (NIH): 67 mg/dL (ref 0–99)
Triglycerides: 43 mg/dL (ref 0–149)
VLDL Cholesterol Cal: 10 mg/dL (ref 5–40)

## 2021-09-12 LAB — CBC WITH DIFFERENTIAL/PLATELET
Basophils Absolute: 0 10*3/uL (ref 0.0–0.2)
Basos: 1 %
EOS (ABSOLUTE): 0.3 10*3/uL (ref 0.0–0.4)
Eos: 5 %
Hematocrit: 42.6 % (ref 37.5–51.0)
Hemoglobin: 14.3 g/dL (ref 13.0–17.7)
Immature Grans (Abs): 0 10*3/uL (ref 0.0–0.1)
Immature Granulocytes: 0 %
Lymphocytes Absolute: 2.3 10*3/uL (ref 0.7–3.1)
Lymphs: 35 %
MCH: 30.4 pg (ref 26.6–33.0)
MCHC: 33.6 g/dL (ref 31.5–35.7)
MCV: 90 fL (ref 79–97)
Monocytes Absolute: 0.6 10*3/uL (ref 0.1–0.9)
Monocytes: 9 %
Neutrophils Absolute: 3.2 10*3/uL (ref 1.4–7.0)
Neutrophils: 50 %
Platelets: 224 10*3/uL (ref 150–450)
RBC: 4.71 x10E6/uL (ref 4.14–5.80)
RDW: 12.9 % (ref 11.6–15.4)
WBC: 6.4 10*3/uL (ref 3.4–10.8)

## 2021-09-12 LAB — CMP14+EGFR
ALT: 29 IU/L (ref 0–44)
AST: 29 IU/L (ref 0–40)
Albumin/Globulin Ratio: 1.9 (ref 1.2–2.2)
Albumin: 4.5 g/dL (ref 3.9–4.9)
Alkaline Phosphatase: 72 IU/L (ref 44–121)
BUN/Creatinine Ratio: 8 — ABNORMAL LOW (ref 10–24)
BUN: 7 mg/dL — ABNORMAL LOW (ref 8–27)
Bilirubin Total: 0.4 mg/dL (ref 0.0–1.2)
CO2: 21 mmol/L (ref 20–29)
Calcium: 9.8 mg/dL (ref 8.6–10.2)
Chloride: 99 mmol/L (ref 96–106)
Creatinine, Ser: 0.86 mg/dL (ref 0.76–1.27)
Globulin, Total: 2.4 g/dL (ref 1.5–4.5)
Glucose: 133 mg/dL — ABNORMAL HIGH (ref 70–99)
Potassium: 4.8 mmol/L (ref 3.5–5.2)
Sodium: 138 mmol/L (ref 134–144)
Total Protein: 6.9 g/dL (ref 6.0–8.5)
eGFR: 93 mL/min/{1.73_m2} (ref >59)

## 2021-09-12 LAB — PSA, TOTAL AND FREE
PSA, Free Pct: 27.5 %
PSA, Free: 0.11 ng/mL
Prostate Specific Ag, Serum: 0.4 ng/mL (ref 0.0–4.0)

## 2021-11-10 ENCOUNTER — Other Ambulatory Visit: Payer: Self-pay | Admitting: Nurse Practitioner

## 2021-11-10 DIAGNOSIS — I10 Essential (primary) hypertension: Secondary | ICD-10-CM

## 2021-11-10 DIAGNOSIS — E119 Type 2 diabetes mellitus without complications: Secondary | ICD-10-CM

## 2021-12-10 ENCOUNTER — Ambulatory Visit: Payer: Medicare PPO | Admitting: Nurse Practitioner

## 2021-12-13 ENCOUNTER — Ambulatory Visit: Payer: Medicare PPO | Admitting: Nurse Practitioner

## 2021-12-13 ENCOUNTER — Other Ambulatory Visit: Payer: Self-pay

## 2021-12-13 ENCOUNTER — Encounter: Payer: Self-pay | Admitting: Nurse Practitioner

## 2021-12-13 VITALS — BP 123/70 | HR 63 | Temp 98.0°F | Resp 20 | Ht 67.0 in | Wt 156.0 lb

## 2021-12-13 DIAGNOSIS — E782 Mixed hyperlipidemia: Secondary | ICD-10-CM

## 2021-12-13 DIAGNOSIS — E119 Type 2 diabetes mellitus without complications: Secondary | ICD-10-CM

## 2021-12-13 DIAGNOSIS — I10 Essential (primary) hypertension: Secondary | ICD-10-CM | POA: Diagnosis not present

## 2021-12-13 DIAGNOSIS — Z6828 Body mass index (BMI) 28.0-28.9, adult: Secondary | ICD-10-CM

## 2021-12-13 LAB — BAYER DCA HB A1C WAIVED: HB A1C (BAYER DCA - WAIVED): 7.9 % — ABNORMAL HIGH (ref 4.8–5.6)

## 2021-12-13 NOTE — Progress Notes (Signed)
Subjective:    Patient ID: Rodney Glover, male    DOB: 11/06/1950, 71 y.o.   MRN: [id number]   Chief Complaint: Medical Management of Chronic Issues    HPI:  Rodney Glover is a 71 y.o. who identifies as a male who was assigned male at birth.   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 Type 2 diabetes mellitus without complication, unspecified whether long term insulin use ([place]) Does watch his diet and checks blood sugars at home. Fasting blood sugars have been running between 130-150. No lows. Glimepiride was increased last visit but pt did not start increased dose until about 2 weeks ago Lab Results  Component Value Date   HGBA1C 7.9 (H) 12/13/2021    2. Mixed hyperlipidemia Does watch diet and walks on treadmill 3 times a week and plays golf several times a week. Lab Results  Component Value Date   CHOL 127 09/11/2021   HDL 50 09/11/2021   LDLCALC 67 09/11/2021   TRIG 43 09/11/2021   CHOLHDL 2.5 09/11/2021   The ASCVD Risk score (Arnett DK, et al., 2019) failed to calculate for the following reasons:   The valid total cholesterol range is 130 to 320 mg/dL   3. Primary hypertension No c/o chest pain, sob, or headaches. Does not check BP at home.  4. BMI 28.0-28.9,adult No recent weight changes. Wt Readings from Last 3 Encounters:  12/13/21 156 lb (70.8 kg)  09/11/21 157 lb (71.2 kg)  06/07/21 153 lb (69.4 kg)   BMI Readings from Last 3 Encounters:  12/13/21 24.43 kg/m  09/11/21 24.59 kg/m  06/07/21 23.96 kg/m      New complaints: None today  Allergies  Allergen Reactions   Niaspan [Niacin Er] Itching   Penicillins     REACTION: hives   Outpatient Encounter Medications as of 12/13/2021  Medication Sig   Accu-Chek Softclix Lancets lancets Test BS daily Dx E11.9   Alcohol Swabs (B-D SINGLE USE SWABS REGULAR) PADS Test BS daily Dx E11.9    amLODipine-benazepril (LOTREL) 5-20 MG capsule TAKE 1 CAPSULE EVERY DAY   aspirin 81 MG tablet Take 81 mg by mouth daily.   Blood Glucose Monitoring Suppl (ACCU-CHEK AVIVA PLUS) w/Device KIT Use to check blood sugar daily   Cholecalciferol (VITAMIN D-3) 5000 UNITS TABS Take 10,000 each by mouth in the morning and at bedtime.   cyanocobalamin 1000 MCG tablet Take 1,000 mcg by mouth daily.   ezetimibe (ZETIA) 10 MG tablet Take 1 tablet (10 mg total) by mouth daily.   fish oil-omega-3 fatty acids 1000 MG capsule Take 2 g by mouth daily. 4 per day 1081m per day   glimepiride (AMARYL) 4 MG tablet TAKE 2 TABLETS (8 MG TOTAL) BY MOUTH DAILY WITH BREAKFAST.   glucose blood (ACCU-CHEK AVIVA PLUS) test strip Test 1x a day- dx 250.02   Melatonin 10 MG TABS    metFORMIN (GLUCOPHAGE) 1000 MG tablet TAKE 1 TABLET TWICE DAILY WITH MEALS   Multiple Vitamin (MULTIVITAMIN) capsule Take 1 capsule by mouth daily.   rosuvastatin (CRESTOR) 20 MG tablet Take 1 tablet (20 mg total) by mouth daily.   sitaGLIPtin (JANUVIA) 100 MG tablet Take 1 tablet (100 mg total) by mouth daily.   TURMERIC PO Take by mouth.   No facility-administered encounter medications on file as of 12/13/2021.    Past Surgical History:  Procedure Laterality Date  APPENDECTOMY     COLONOSCOPY     HERNIA REPAIR     KNEE SURGERY Bilateral 12/07/2012   Dr. Tonita Cong   POLYPECTOMY      Family History  Problem Relation Age of Onset   Hyperlipidemia Mother    Heart disease Father    Cancer Father        bladder, and prostate   Stroke Father    Coronary artery disease Father    Prostate cancer Father    Colon cancer Father    Hyperlipidemia Sister    Hypertension Sister    Prostate cancer Other    Diabetes Other    Heart disease Other    Esophageal cancer Neg Hx    Rectal cancer Neg Hx    Stomach cancer Neg Hx    Colon polyps Neg Hx       Controlled substance contract: n/a     Review of Systems  Constitutional:  Negative  for activity change and appetite change.  Eyes:  Negative for pain and visual disturbance.  Respiratory:  Negative for chest tightness and shortness of breath.   Cardiovascular:  Negative for chest pain, palpitations and leg swelling.  Gastrointestinal:  Negative for abdominal pain.  Endocrine: Negative for polydipsia and polyphagia.  Genitourinary:  Negative for difficulty urinating.  Musculoskeletal:  Negative for arthralgias.  Neurological:  Negative for dizziness, weakness and headaches.  Hematological:  Negative for adenopathy. Does not bruise/bleed easily.  All other systems reviewed and are negative.      Objective:   Physical Exam Vitals and nursing note reviewed.  Constitutional:      General: He is not in acute distress.    Appearance: Normal appearance. He is well-developed.  HENT:     Head: Normocephalic and atraumatic.     Right Ear: Tympanic membrane normal.     Left Ear: Tympanic membrane normal.     Nose: Nose normal.     Mouth/Throat:     Mouth: Mucous membranes are moist.     Pharynx: Oropharynx is clear.  Eyes:     Conjunctiva/sclera: Conjunctivae normal.     Pupils: Pupils are equal, round, and reactive to light.  Neck:     Vascular: No carotid bruit or JVD.  Cardiovascular:     Rate and Rhythm: Normal rate and regular rhythm.     Pulses: Normal pulses.     Heart sounds: Normal heart sounds. No murmur heard.    No gallop.  Pulmonary:     Effort: Pulmonary effort is normal. No respiratory distress.     Breath sounds: Normal breath sounds. No wheezing, rhonchi or rales.  Abdominal:     General: Bowel sounds are normal. There is no distension.     Palpations: Abdomen is soft.     Tenderness: There is no abdominal tenderness.  Musculoskeletal:        General: Normal range of motion.     Cervical back: Normal range of motion. No rigidity or tenderness.     Right lower leg: No edema.     Left lower leg: No edema.  Lymphadenopathy:     Cervical: No  cervical adenopathy.  Skin:    General: Skin is warm and dry.     Capillary Refill: Capillary refill takes less than 2 seconds.  Neurological:     General: No focal deficit present.     Mental Status: He is alert and oriented to person, place, and time.  Psychiatric:  Mood and Affect: Mood normal.        Behavior: Behavior normal.     BP 123/70   Pulse 63   Temp 98 F (36.7 C) (Temporal)   Resp 20   Ht _0  (1.702 m)   Wt 156 lb (70.8 kg)   SpO2 98%   BMI 24.43 kg/m   HbA1c 7.9%      Assessment & Plan:   Rodney Glover comes in today with chief complaint of Medical Management of Chronic Issues   Diagnosis and orders addressed:  1. Type 2 diabetes mellitus without complication, unspecified whether long term insulin use (HCC) Strict carb counting - Microalbumin / creatinine urine ratio - Bayer DCA Hb A1c Waived  2. Mixed hyperlipidemia Low fat diet Continue exercise - Lipid panel  3. Primary hypertension Low sodium diet - CMP14+EGFR - CBC with Differential/Platelet  4. BMI 28.0-28.9,adult Discussed diet and exercise for person with BMI >25 Will recheck weight in 3-6 months    Labs pending Health Maintenance reviewed Diet and exercise encouraged  Follow up plan: 3 months   Mary-Margaret Hassell Done, FNP

## 2021-12-13 NOTE — Patient Instructions (Signed)
Exercising to Stay Healthy To become healthy and stay healthy, it is recommended that you do moderate-intensity and vigorous-intensity exercise. You can tell that you are exercising at a moderate intensity if your heart starts beating faster and you start breathing faster but can still hold a conversation. You can tell that you are exercising at a vigorous intensity if you are breathing much harder and faster and cannot hold a conversation while exercising. How can exercise benefit me? Exercising regularly is important. It has many health benefits, such as: Improving overall fitness, flexibility, and endurance. Increasing bone density. Helping with weight control. Decreasing body fat. Increasing muscle strength and endurance. Reducing stress and tension, anxiety, depression, or anger. Improving overall health. What guidelines should I follow while exercising? Before you start a new exercise program, talk with your health care provider. Do not exercise so much that you hurt yourself, feel dizzy, or get very short of breath. Wear comfortable clothes and wear shoes with good support. Drink plenty of water while you exercise to prevent dehydration or heat stroke. Work out until your breathing and your heartbeat get faster (moderate intensity). How often should I exercise? Choose an activity that you enjoy, and set realistic goals. Your health care provider can help you make an activity plan that is individually designed and works best for you. Exercise regularly as told by your health care provider. This may include: Doing strength training two times a week, such as: Lifting weights. Using resistance bands. Push-ups. Sit-ups. Yoga. Doing a certain intensity of exercise for a given amount of time. Choose from these options: A total of 150 minutes of moderate-intensity exercise every week. A total of 75 minutes of vigorous-intensity exercise every week. A mix of moderate-intensity and  vigorous-intensity exercise every week. Children, pregnant women, people who have not exercised regularly, people who are overweight, and older adults may need to talk with a health care provider about what activities are safe to perform. If you have a medical condition, be sure to talk with your health care provider before you start a new exercise program. What are some exercise ideas? Moderate-intensity exercise ideas include: Walking 1 mile (1.6 km) in about 15 minutes. Biking. Hiking. Golfing. Dancing. Water aerobics. Vigorous-intensity exercise ideas include: Walking 4.5 miles (7.2 km) or more in about 1 hour. Jogging or running 5 miles (8 km) in about 1 hour. Biking 10 miles (16.1 km) or more in about 1 hour. Lap swimming. Roller-skating or in-line skating. Cross-country skiing. Vigorous competitive sports, such as football, basketball, and soccer. Jumping rope. Aerobic dancing. What are some everyday activities that can help me get exercise? Yard work, such as: Pushing a lawn mower. Raking and bagging leaves. Washing your car. Pushing a stroller. Shoveling snow. Gardening. Washing windows or floors. How can I be more active in my day-to-day activities? Use stairs instead of an elevator. Take a walk during your lunch break. If you drive, park your car farther away from your work or school. If you take public transportation, get off one stop early and walk the rest of the way. Stand up or walk around during all of your indoor phone calls. Get up, stretch, and walk around every 30 minutes throughout the day. Enjoy exercise with a friend. Support to continue exercising will help you keep a regular routine of activity. Where to find more information You can find more information about exercising to stay healthy from: U.S. Department of Health and Human Services: www.hhs.gov Centers for Disease Control and Prevention (  CDC): www.cdc.gov Summary Exercising regularly is  important. It will improve your overall fitness, flexibility, and endurance. Regular exercise will also improve your overall health. It can help you control your weight, reduce stress, and improve your bone density. Do not exercise so much that you hurt yourself, feel dizzy, or get very short of breath. Before you start a new exercise program, talk with your health care provider. This information is not intended to replace advice given to you by your health care provider. Make sure you discuss any questions you have with your health care provider. Document Revised: 05/19/2020 Document Reviewed: 05/19/2020 Elsevier Patient Education  2023 Elsevier Inc.  

## 2021-12-14 LAB — CBC WITH DIFFERENTIAL/PLATELET
Basophils Absolute: 0.1 10*3/uL (ref 0.0–0.2)
Basos: 1 %
EOS (ABSOLUTE): 0.3 10*3/uL (ref 0.0–0.4)
Eos: 4 %
Hematocrit: 41.9 % (ref 37.5–51.0)
Hemoglobin: 13.9 g/dL (ref 13.0–17.7)
Immature Grans (Abs): 0 10*3/uL (ref 0.0–0.1)
Immature Granulocytes: 0 %
Lymphocytes Absolute: 2.5 10*3/uL (ref 0.7–3.1)
Lymphs: 39 %
MCH: 29.8 pg (ref 26.6–33.0)
MCHC: 33.2 g/dL (ref 31.5–35.7)
MCV: 90 fL (ref 79–97)
Monocytes Absolute: 0.6 10*3/uL (ref 0.1–0.9)
Monocytes: 9 %
Neutrophils Absolute: 3.1 10*3/uL (ref 1.4–7.0)
Neutrophils: 47 %
Platelets: 248 10*3/uL (ref 150–450)
RBC: 4.66 x10E6/uL (ref 4.14–5.80)
RDW: 12.1 % (ref 11.6–15.4)
WBC: 6.5 10*3/uL (ref 3.4–10.8)

## 2021-12-14 LAB — CMP14+EGFR
ALT: 33 IU/L (ref 0–44)
AST: 31 IU/L (ref 0–40)
Albumin/Globulin Ratio: 1.9 (ref 1.2–2.2)
Albumin: 4.6 g/dL (ref 3.9–4.9)
Alkaline Phosphatase: 73 IU/L (ref 44–121)
BUN/Creatinine Ratio: 10 (ref 10–24)
BUN: 9 mg/dL (ref 8–27)
Bilirubin Total: 0.4 mg/dL (ref 0.0–1.2)
CO2: 25 mmol/L (ref 20–29)
Calcium: 9.5 mg/dL (ref 8.6–10.2)
Chloride: 100 mmol/L (ref 96–106)
Creatinine, Ser: 0.88 mg/dL (ref 0.76–1.27)
Globulin, Total: 2.4 g/dL (ref 1.5–4.5)
Glucose: 146 mg/dL — ABNORMAL HIGH (ref 70–99)
Potassium: 5.3 mmol/L — ABNORMAL HIGH (ref 3.5–5.2)
Sodium: 140 mmol/L (ref 134–144)
Total Protein: 7 g/dL (ref 6.0–8.5)
eGFR: 93 mL/min/{1.73_m2} (ref >59)

## 2021-12-14 LAB — MICROALBUMIN / CREATININE URINE RATIO
Creatinine, Urine: 51.8 mg/dL
Microalb/Creat Ratio: <6 mg/g creat (ref 0–29)
Microalbumin, Urine: <3 ug/mL

## 2021-12-14 LAB — LIPID PANEL
Chol/HDL Ratio: 2.5 ratio (ref 0.0–5.0)
Cholesterol, Total: 136 mg/dL (ref 100–199)
HDL: 54 mg/dL (ref >39)
LDL Chol Calc (NIH): 71 mg/dL (ref 0–99)
Triglycerides: 46 mg/dL (ref 0–149)
VLDL Cholesterol Cal: 11 mg/dL (ref 5–40)

## 2021-12-18 DIAGNOSIS — Z1283 Encounter for screening for malignant neoplasm of skin: Secondary | ICD-10-CM | POA: Diagnosis not present

## 2021-12-18 DIAGNOSIS — L570 Actinic keratosis: Secondary | ICD-10-CM | POA: Diagnosis not present

## 2021-12-18 DIAGNOSIS — D239 Other benign neoplasm of skin, unspecified: Secondary | ICD-10-CM | POA: Diagnosis not present

## 2021-12-18 DIAGNOSIS — Z85828 Personal history of other malignant neoplasm of skin: Secondary | ICD-10-CM | POA: Diagnosis not present

## 2022-02-07 ENCOUNTER — Other Ambulatory Visit: Payer: Self-pay | Admitting: Nurse Practitioner

## 2022-02-07 DIAGNOSIS — E119 Type 2 diabetes mellitus without complications: Secondary | ICD-10-CM

## 2022-02-09 ENCOUNTER — Other Ambulatory Visit: Payer: Self-pay | Admitting: Nurse Practitioner

## 2022-02-09 DIAGNOSIS — E782 Mixed hyperlipidemia: Secondary | ICD-10-CM

## 2022-03-18 ENCOUNTER — Ambulatory Visit: Payer: Medicare PPO | Admitting: Nurse Practitioner

## 2022-03-18 NOTE — Progress Notes (Deleted)
Subjective:    Patient ID: Rodney Glover, male    DOB: 1950-10-21, 72 y.o.   MRN: [id number]   Chief Complaint: medical management of chronic issues     HPI:  Rodney Glover is a 72 y.o. who identifies as a male who was assigned male at birth.   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 Primary hypertension No c/o chest pain, sob or headache. Does not check blood pressure at home. BP Readings from Last 3 Encounters:  12/13/21 123/70  09/11/21 135/81  06/07/21 125/68     2. Mixed hyperlipidemia Does watch diet and stays very active playing golf several times a week. Lab Results  Component Value Date   CHOL 136 12/13/2021   HDL 54 12/13/2021   LDLCALC 71 12/13/2021   TRIG 46 12/13/2021   CHOLHDL 2.5 12/13/2021     3. Type 2 diabetes mellitus without complication, unspecified whether long term insulin use (HCC) Blood sugars are running around . No low blood sugars.-  4. BMI 28.0-28.9,adult No recent weight changes   New complaints: ***  Allergies  Allergen Reactions   Niaspan [Niacin Er] Itching   Penicillins     REACTION: hives   Outpatient Encounter Medications as of 03/18/2022  Medication Sig   Accu-Chek Softclix Lancets lancets Test BS daily Dx E11.9   Alcohol Swabs (B-D SINGLE USE SWABS REGULAR) PADS Test BS daily Dx E11.9   amLODipine-benazepril (LOTREL) 5-20 MG capsule TAKE 1 CAPSULE EVERY DAY   aspirin 81 MG tablet Take 81 mg by mouth daily.   Blood Glucose Monitoring Suppl (ACCU-CHEK AVIVA PLUS) w/Device KIT Use to check blood sugar daily   Cholecalciferol (VITAMIN D-3) 5000 UNITS TABS Take 10,000 each by mouth in the morning and at bedtime.   cyanocobalamin 1000 MCG tablet Take 1,000 mcg by mouth daily.   ezetimibe (ZETIA) 10 MG tablet TAKE 1 TABLET EVERY DAY   fish oil-omega-3 fatty acids 1000 MG capsule Take 2 g by mouth daily. 4 per day 1026m per  day   glimepiride (AMARYL) 4 MG tablet TAKE 2 TABLETS (8 MG TOTAL) BY MOUTH DAILY WITH BREAKFAST.   glucose blood (ACCU-CHEK AVIVA PLUS) test strip Test 1x a day- dx 250.02   Melatonin 10 MG TABS    metFORMIN (GLUCOPHAGE) 1000 MG tablet TAKE 1 TABLET TWICE DAILY WITH MEALS   Multiple Vitamin (MULTIVITAMIN) capsule Take 1 capsule by mouth daily.   rosuvastatin (CRESTOR) 20 MG tablet Take 1 tablet (20 mg total) by mouth daily.   sitaGLIPtin (JANUVIA) 100 MG tablet Take 1 tablet (100 mg total) by mouth daily.   TURMERIC PO Take by mouth.   No facility-administered encounter medications on file as of 03/18/2022.    Past Surgical History:  Procedure Laterality Date   APPENDECTOMY     COLONOSCOPY     HERNIA REPAIR     KNEE SURGERY Bilateral 12/07/2012   Dr. BTonita Cong  POLYPECTOMY      Family History  Problem Relation Age of Onset   Hyperlipidemia Mother    Heart disease Father    Cancer Father        bladder, and prostate   Stroke Father    Coronary artery disease Father    Prostate cancer Father    Colon cancer Father    Hyperlipidemia Sister    Hypertension Sister    Prostate cancer Other  Diabetes Other    Heart disease Other    Esophageal cancer Neg Hx    Rectal cancer Neg Hx    Stomach cancer Neg Hx    Colon polyps Neg Hx       Controlled substance contract: ***     Review of Systems     Objective:   Physical Exam        Assessment & Plan:

## 2022-03-24 ENCOUNTER — Telehealth: Payer: Medicare PPO | Admitting: Family

## 2022-03-24 DIAGNOSIS — U071 COVID-19: Secondary | ICD-10-CM | POA: Diagnosis not present

## 2022-03-24 MED ORDER — BENZONATATE 100 MG PO CAPS: 100.0000 mg | ORAL_CAPSULE | Freq: Three times a day (TID) | ORAL | 0 refills | Status: DC | PRN

## 2022-03-24 MED ORDER — FLUTICASONE PROPIONATE 50 MCG/ACT NA SUSP: 2.0000 | Freq: Every day | NASAL | 6 refills | Status: DC

## 2022-03-24 NOTE — Progress Notes (Signed)
E-Visit  for Positive Covid Test Result   We are sorry you are not feeling well. We are here to help!  You have tested positive for COVID-19, meaning that you were infected with the novel coronavirus and could give the virus to others.  It is vitally important that you stay home so you do not spread it to others.      Isolation Instructions:   You are to isolate for 5 days from onset of your symptoms. If you must be around other household members who do not have symptoms, you need to make sure that both you and the family members are masking consistently with a high-quality mask.  After day 5 of isolation, if you have had no fever within 24 hours and you are feeling better, you can end isolation but need to mask for an additional 5 days.  After day 5 if you have a fever or are having significant symptoms, please isolate for full 10 days.  If you note any worsening of symptoms despite treatment, please seek an in-person evaluation ASAP. If you note any significant shortness of breath or any chest pain, please seek ER evaluation. Please do not delay care!   Go to the nearest hospital ED for assessment if fever/cough/breathlessness are severe or illness seems like a threat to life.    The following symptoms may appear 2-14 days after exposure: Fever Cough Shortness of breath or difficulty breathing Chills Repeated shaking with chills Muscle pain Headache Sore throat New loss of taste or smell Fatigue Congestion or runny nose Nausea or vomiting Diarrhea  You can use medication such as prescription cough medication called Tessalon Perles 100 mg. You may take 1-2 capsules every 8 hours as needed for cough and prescription for Fluticasone nasal spray 2 sprays in each nostril one time per day.   For you to get to the Paxlovid, you need to do a video visit.   You may also take acetaminophen (Tylenol) as needed for fever.  HOME CARE: Only take medications as instructed by your  medical team. Drink plenty of fluids and get plenty of rest. A steam or ultrasonic humidifier can help if you have congestion.   GET HELP RIGHT AWAY IF YOU HAVE EMERGENCY WARNING SIGNS.  Call 911 or proceed to your closest emergency facility if: You develop worsening high fever. Trouble breathing Bluish lips or face Persistent pain or pressure in the chest New confusion Inability to wake or stay awake You cough up blood. Your symptoms become more severe Inability to hold down food or fluids  This list is not all possible symptoms. Contact your medical provider for any symptoms that are severe or concerning to you.   Your e-visit answers were reviewed by a board certified advanced clinical practitioner to complete your personal care plan.  Depending on the condition, your plan could have included both over the counter or prescription medications.  If there is a problem please reply once you have received a response from your provider.  Your safety is important to [place].  If you have drug allergies check your prescription carefully.    You can use MyChart to ask questions about today's visit, request a non-urgent call back, or ask for a work or school excuse for 24 hours related to this e-Visit. If it has been greater than 24 hours you will need to follow up with your provider, or enter a new e-Visit to address those concerns. You will get an e-mail in the next  two days asking about your experience.  I hope that your e-visit has been valuable and will speed your recovery. Thank you for using e-visits.    Approximately 5 minutes was spent documenting and reviewing patient's chart.

## 2022-04-02 ENCOUNTER — Encounter: Payer: Self-pay | Admitting: Nurse Practitioner

## 2022-04-02 ENCOUNTER — Ambulatory Visit: Payer: Medicare PPO | Admitting: Nurse Practitioner

## 2022-04-02 VITALS — BP 144/71 | HR 64 | Temp 98.1°F | Resp 20 | Ht 67.0 in | Wt 158.0 lb

## 2022-04-02 DIAGNOSIS — E119 Type 2 diabetes mellitus without complications: Secondary | ICD-10-CM

## 2022-04-02 DIAGNOSIS — Z6828 Body mass index (BMI) 28.0-28.9, adult: Secondary | ICD-10-CM

## 2022-04-02 DIAGNOSIS — Z7984 Long term (current) use of oral hypoglycemic drugs: Secondary | ICD-10-CM

## 2022-04-02 DIAGNOSIS — Z0001 Encounter for general adult medical examination with abnormal findings: Secondary | ICD-10-CM

## 2022-04-02 DIAGNOSIS — Z0000 Encounter for general adult medical examination without abnormal findings: Secondary | ICD-10-CM

## 2022-04-02 DIAGNOSIS — E782 Mixed hyperlipidemia: Secondary | ICD-10-CM

## 2022-04-02 DIAGNOSIS — I10 Essential (primary) hypertension: Secondary | ICD-10-CM | POA: Diagnosis not present

## 2022-04-02 LAB — BAYER DCA HB A1C WAIVED: HB A1C (BAYER DCA - WAIVED): 10.2 % — ABNORMAL HIGH (ref 4.8–5.6)

## 2022-04-02 MED ORDER — EZETIMIBE 10 MG PO TABS: 10.0000 mg | ORAL_TABLET | Freq: Every day | ORAL | 1 refills | Status: DC

## 2022-04-02 MED ORDER — METFORMIN HCL 1000 MG PO TABS: 1000.0000 mg | ORAL_TABLET | Freq: Two times a day (BID) | ORAL | 1 refills | Status: DC

## 2022-04-02 MED ORDER — GLIMEPIRIDE 4 MG PO TABS: 8.0000 mg | ORAL_TABLET | Freq: Every day | ORAL | 0 refills | Status: DC

## 2022-04-02 MED ORDER — ROSUVASTATIN CALCIUM 20 MG PO TABS: 20.0000 mg | ORAL_TABLET | Freq: Every day | ORAL | 1 refills | Status: DC

## 2022-04-02 MED ORDER — SITAGLIPTIN PHOSPHATE 100 MG PO TABS: 100.0000 mg | ORAL_TABLET | Freq: Every day | ORAL | 1 refills | Status: DC

## 2022-04-02 MED ORDER — AMLODIPINE BESY-BENAZEPRIL HCL 5-20 MG PO CAPS: 1.0000 | ORAL_CAPSULE | Freq: Every day | ORAL | 1 refills | Status: DC

## 2022-04-02 NOTE — Patient Instructions (Signed)

## 2022-04-02 NOTE — Progress Notes (Signed)
Subjective:    Patient ID: Rodney Glover, male    DOB: [birth year], 72 y.o.   MRN: [id number]   Chief Complaint:    HPI:  Rodney Glover is a 72 y.o. who identifies as a male who was assigned male at birth.   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 Primary hypertension No c/o chest pain, sob or headache. Does not check blood pressure at home. BP Readings from Last 3 Encounters:  12/13/21 123/70  09/11/21 135/81  06/07/21 125/68     2. Mixed hyperlipidemia Does watch diet and walks 18 hole golf course at least 2 x a week. Lab Results  Component Value Date   CHOL 136 12/13/2021   HDL 54 12/13/2021   LDLCALC 71 12/13/2021   TRIG 46 12/13/2021   CHOLHDL 2.5 12/13/2021     3. Type 2 diabetes mellitus without complication, unspecified whether long term insulin use (HCC) Fasting blood sugars are running around 120-150. He had covid a week or so ago and blood sugars have been as high as 200 since then fasting.. Lab Results  Component Value Date   HGBA1C 7.9 (H) 12/13/2021     4. BMI 28.0-28.9,adult No recent weight changes Wt Readings from Last 3 Encounters:  04/02/22 158 lb (71.7 kg)  12/13/21 156 lb (70.8 kg)  09/11/21 157 lb (71.2 kg)   BMI Readings from Last 3 Encounters:  04/02/22 24.75 kg/m  12/13/21 24.43 kg/m  09/11/21 24.59 kg/m   5. Annual physical  New complaints: None today  Allergies  Allergen Reactions   Niaspan [Niacin Er] Itching   Penicillins     REACTION: hives   Outpatient Encounter Medications as of 04/02/2022  Medication Sig   Accu-Chek Softclix Lancets lancets Test BS daily Dx E11.9   Alcohol Swabs (B-D SINGLE USE SWABS REGULAR) PADS Test BS daily Dx E11.9   amLODipine-benazepril (LOTREL) 5-20 MG capsule TAKE 1 CAPSULE EVERY DAY   aspirin 81 MG tablet Take 81 mg by mouth daily.   benzonatate (TESSALON PERLES) 100 MG capsule Take 1  capsule (100 mg total) by mouth 3 (three) times daily as needed.   Blood Glucose Monitoring Suppl (ACCU-CHEK AVIVA PLUS) w/Device KIT Use to check blood sugar daily   Cholecalciferol (VITAMIN D-3) 5000 UNITS TABS Take 10,000 each by mouth in the morning and at bedtime.   cyanocobalamin 1000 MCG tablet Take 1,000 mcg by mouth daily.   ezetimibe (ZETIA) 10 MG tablet TAKE 1 TABLET EVERY DAY   fish oil-omega-3 fatty acids 1000 MG capsule Take 2 g by mouth daily. 4 per day '1000mg'$  per day   fluticasone (FLONASE) 50 MCG/ACT nasal spray Place 2 sprays into both nostrils daily.   glimepiride (AMARYL) 4 MG tablet TAKE 2 TABLETS (8 MG TOTAL) BY MOUTH DAILY WITH BREAKFAST.   glucose blood (ACCU-CHEK AVIVA PLUS) test strip Test 1x a day- dx 250.02   Melatonin 10 MG TABS    metFORMIN (GLUCOPHAGE) 1000 MG tablet TAKE 1 TABLET TWICE DAILY WITH MEALS   Multiple Vitamin (MULTIVITAMIN) capsule Take 1 capsule by mouth daily.   rosuvastatin (CRESTOR) 20 MG tablet Take 1 tablet (20 mg total) by mouth daily.   sitaGLIPtin (JANUVIA) 100 MG tablet Take 1 tablet (100 mg total) by mouth daily.   TURMERIC PO Take by mouth.   No facility-administered encounter medications on file as of 04/02/2022.  Past Surgical History:  Procedure Laterality Date   APPENDECTOMY     COLONOSCOPY     HERNIA REPAIR     KNEE SURGERY Bilateral 12/07/2012   Dr. Tonita Cong   POLYPECTOMY      Family History  Problem Relation Age of Onset   Hyperlipidemia Mother    Heart disease Father    Cancer Father        bladder, and prostate   Stroke Father    Coronary artery disease Father    Prostate cancer Father    Colon cancer Father    Hyperlipidemia Sister    Hypertension Sister    Prostate cancer Other    Diabetes Other    Heart disease Other    Esophageal cancer Neg Hx    Rectal cancer Neg Hx    Stomach cancer Neg Hx    Colon polyps Neg Hx       Controlled substance contract: n/a     Review of Systems  Constitutional:   Negative for diaphoresis.  Eyes:  Negative for pain.  Respiratory:  Negative for shortness of breath.   Cardiovascular:  Negative for chest pain, palpitations and leg swelling.  Gastrointestinal:  Negative for abdominal pain.  Endocrine: Negative for polydipsia.  Skin:  Negative for rash.  Neurological:  Negative for dizziness, weakness and headaches.  Hematological:  Does not bruise/bleed easily.  All other systems reviewed and are negative.      Objective:   Physical Exam Vitals and nursing note reviewed.  Constitutional:      Appearance: Normal appearance. He is well-developed.  HENT:     Head: Normocephalic.     Nose: Nose normal.     Mouth/Throat:     Mouth: Mucous membranes are moist.     Pharynx: Oropharynx is clear.  Eyes:     Pupils: Pupils are equal, round, and reactive to light.  Neck:     Thyroid: No thyroid mass or thyromegaly.     Vascular: No carotid bruit or JVD.     Trachea: Phonation normal.  Cardiovascular:     Rate and Rhythm: Normal rate and regular rhythm.  Pulmonary:     Effort: Pulmonary effort is normal. No respiratory distress.     Breath sounds: Normal breath sounds.  Abdominal:     General: Bowel sounds are normal.     Palpations: Abdomen is soft.     Tenderness: There is no abdominal tenderness.  Musculoskeletal:        General: Normal range of motion.     Cervical back: Normal range of motion and neck supple.  Lymphadenopathy:     Cervical: No cervical adenopathy.  Skin:    General: Skin is warm and dry.  Neurological:     Mental Status: He is alert and oriented to person, place, and time.  Psychiatric:        Behavior: Behavior normal.        Thought Content: Thought content normal.        Judgment: Judgment normal.     BP (!) 144/71   Pulse 64   Temp 98.1 F (36.7 C) (Temporal)   Resp 20   Ht '5\' 7"'$  (1.702 m)   Wt 158 lb (71.7 kg)   SpO2 100%   BMI 24.75 kg/m   Hgba1c 10.2%     Assessment & Plan:  Rodney Glover  comes in today with chief complaint of Annual Exam   Diagnosis and orders addressed:  1. Primary hypertension  Low sodium diet - CBC with Differential/Platelet - CMP14+EGFR - amLODipine-benazepril (LOTREL) 5-20 MG capsule; Take 1 capsule by mouth daily.  Dispense: 90 capsule; Refill: 1  2. Mixed hyperlipidemia Low fat diet - Lipid panel - ezetimibe (ZETIA) 10 MG tablet; Take 1 tablet (10 mg total) by mouth daily.  Dispense: 90 tablet; Refill: 1 - rosuvastatin (CRESTOR) 20 MG tablet; Take 1 tablet (20 mg total) by mouth daily.  Dispense: 90 tablet; Refill: 1  3. Type 2 diabetes mellitus without complication, unspecified whether long term insulin use (HCC) Continue  to watch carbs in diet Wants to try diet for 3 months- will make changes if not getting better - Bayer DCA Hb A1c Waived - glimepiride (AMARYL) 4 MG tablet; Take 2 tablets (8 mg total) by mouth daily with breakfast.  Dispense: 180 tablet; Refill: 0 - metFORMIN (GLUCOPHAGE) 1000 MG tablet; Take 1 tablet (1,000 mg total) by mouth 2 (two) times daily with a meal.  Dispense: 180 tablet; Refill: 1 - sitaGLIPtin (JANUVIA) 100 MG tablet; Take 1 tablet (100 mg total) by mouth daily.  Dispense: 90 tablet; Refill: 1  4. BMI 28.0-28.9,adult Discussed diet and exercise for person with BMI >25 Will recheck weight in 3-6 months    Labs pending Health Maintenance reviewed Diet and exercise encouraged  Follow up plan: 3 months   Mary-Margaret Hassell Done, FNP

## 2022-04-03 LAB — CMP14+EGFR
ALT: 35 IU/L (ref 0–44)
AST: 27 IU/L (ref 0–40)
Albumin/Globulin Ratio: 2 (ref 1.2–2.2)
Albumin: 4.5 g/dL (ref 3.8–4.8)
Alkaline Phosphatase: 82 IU/L (ref 44–121)
BUN/Creatinine Ratio: 16 (ref 10–24)
BUN: 13 mg/dL (ref 8–27)
Bilirubin Total: 0.4 mg/dL (ref 0.0–1.2)
CO2: 22 mmol/L (ref 20–29)
Calcium: 9.2 mg/dL (ref 8.6–10.2)
Chloride: 100 mmol/L (ref 96–106)
Creatinine, Ser: 0.8 mg/dL (ref 0.76–1.27)
Globulin, Total: 2.2 g/dL (ref 1.5–4.5)
Glucose: 192 mg/dL — ABNORMAL HIGH (ref 70–99)
Potassium: 5 mmol/L (ref 3.5–5.2)
Sodium: 138 mmol/L (ref 134–144)
Total Protein: 6.7 g/dL (ref 6.0–8.5)
eGFR: 95 mL/min/{1.73_m2} (ref >59)

## 2022-04-03 LAB — CBC WITH DIFFERENTIAL/PLATELET
Basophils Absolute: 0 10*3/uL (ref 0.0–0.2)
Basos: 1 %
EOS (ABSOLUTE): 0.3 10*3/uL (ref 0.0–0.4)
Eos: 4 %
Hematocrit: 42 % (ref 37.5–51.0)
Hemoglobin: 14.1 g/dL (ref 13.0–17.7)
Immature Grans (Abs): 0 10*3/uL (ref 0.0–0.1)
Immature Granulocytes: 0 %
Lymphocytes Absolute: 2.6 10*3/uL (ref 0.7–3.1)
Lymphs: 39 %
MCH: 29.7 pg (ref 26.6–33.0)
MCHC: 33.6 g/dL (ref 31.5–35.7)
MCV: 88 fL (ref 79–97)
Monocytes Absolute: 0.6 10*3/uL (ref 0.1–0.9)
Monocytes: 9 %
Neutrophils Absolute: 3.2 10*3/uL (ref 1.4–7.0)
Neutrophils: 47 %
Platelets: 227 10*3/uL (ref 150–450)
RBC: 4.75 x10E6/uL (ref 4.14–5.80)
RDW: 13.4 % (ref 11.6–15.4)
WBC: 6.7 10*3/uL (ref 3.4–10.8)

## 2022-04-03 LAB — LIPID PANEL
Chol/HDL Ratio: 2.8 ratio (ref 0.0–5.0)
Cholesterol, Total: 128 mg/dL (ref 100–199)
HDL: 46 mg/dL (ref >39)
LDL Chol Calc (NIH): 69 mg/dL (ref 0–99)
Triglycerides: 61 mg/dL (ref 0–149)
VLDL Cholesterol Cal: 13 mg/dL (ref 5–40)

## 2022-04-13 ENCOUNTER — Other Ambulatory Visit: Payer: Self-pay | Admitting: Nurse Practitioner

## 2022-04-13 DIAGNOSIS — E782 Mixed hyperlipidemia: Secondary | ICD-10-CM

## 2022-04-13 DIAGNOSIS — E119 Type 2 diabetes mellitus without complications: Secondary | ICD-10-CM

## 2022-05-03 ENCOUNTER — Other Ambulatory Visit: Payer: Self-pay | Admitting: Nurse Practitioner

## 2022-05-03 DIAGNOSIS — E119 Type 2 diabetes mellitus without complications: Secondary | ICD-10-CM

## 2022-06-18 DIAGNOSIS — L570 Actinic keratosis: Secondary | ICD-10-CM | POA: Diagnosis not present

## 2022-06-18 DIAGNOSIS — D485 Neoplasm of uncertain behavior of skin: Secondary | ICD-10-CM | POA: Diagnosis not present

## 2022-06-18 DIAGNOSIS — L820 Inflamed seborrheic keratosis: Secondary | ICD-10-CM | POA: Diagnosis not present

## 2022-07-22 ENCOUNTER — Ambulatory Visit: Payer: Medicare PPO | Admitting: Nurse Practitioner

## 2022-07-23 LAB — HM DIABETES EYE EXAM

## 2022-08-14 DIAGNOSIS — M7731 Calcaneal spur, right foot: Secondary | ICD-10-CM | POA: Diagnosis not present

## 2022-08-14 DIAGNOSIS — M722 Plantar fascial fibromatosis: Secondary | ICD-10-CM | POA: Diagnosis not present

## 2022-08-14 DIAGNOSIS — M25571 Pain in right ankle and joints of right foot: Secondary | ICD-10-CM | POA: Diagnosis not present

## 2022-08-14 DIAGNOSIS — S93401A Sprain of unspecified ligament of right ankle, initial encounter: Secondary | ICD-10-CM | POA: Diagnosis not present

## 2022-08-14 DIAGNOSIS — M79671 Pain in right foot: Secondary | ICD-10-CM | POA: Diagnosis not present

## 2022-08-14 DIAGNOSIS — M19071 Primary osteoarthritis, right ankle and foot: Secondary | ICD-10-CM | POA: Diagnosis not present

## 2022-08-20 ENCOUNTER — Ambulatory Visit (INDEPENDENT_AMBULATORY_CARE_PROVIDER_SITE_OTHER): Payer: Medicare PPO

## 2022-08-20 ENCOUNTER — Encounter: Payer: Self-pay | Admitting: Nurse Practitioner

## 2022-08-20 ENCOUNTER — Ambulatory Visit: Payer: Medicare PPO | Admitting: Nurse Practitioner

## 2022-08-20 VITALS — BP 134/73 | HR 58 | Temp 97.7°F | Resp 20 | Ht 67.0 in | Wt 156.0 lb

## 2022-08-20 DIAGNOSIS — Z6828 Body mass index (BMI) 28.0-28.9, adult: Secondary | ICD-10-CM

## 2022-08-20 DIAGNOSIS — E1169 Type 2 diabetes mellitus with other specified complication: Secondary | ICD-10-CM

## 2022-08-20 DIAGNOSIS — Z7984 Long term (current) use of oral hypoglycemic drugs: Secondary | ICD-10-CM

## 2022-08-20 DIAGNOSIS — M25552 Pain in left hip: Secondary | ICD-10-CM | POA: Diagnosis not present

## 2022-08-20 DIAGNOSIS — E782 Mixed hyperlipidemia: Secondary | ICD-10-CM | POA: Diagnosis not present

## 2022-08-20 DIAGNOSIS — I10 Essential (primary) hypertension: Secondary | ICD-10-CM

## 2022-08-20 DIAGNOSIS — E119 Type 2 diabetes mellitus without complications: Secondary | ICD-10-CM | POA: Diagnosis not present

## 2022-08-20 DIAGNOSIS — M47816 Spondylosis without myelopathy or radiculopathy, lumbar region: Secondary | ICD-10-CM | POA: Diagnosis not present

## 2022-08-20 DIAGNOSIS — E785 Hyperlipidemia, unspecified: Secondary | ICD-10-CM | POA: Diagnosis not present

## 2022-08-20 DIAGNOSIS — G8929 Other chronic pain: Secondary | ICD-10-CM | POA: Diagnosis not present

## 2022-08-20 LAB — CBC WITH DIFFERENTIAL/PLATELET
EOS (ABSOLUTE): 0.2 10*3/uL (ref 0.0–0.4)
Hemoglobin: 14.1 g/dL (ref 13.0–17.7)
RDW: 12.9 % (ref 11.6–15.4)

## 2022-08-20 LAB — CMP14+EGFR: Chloride: 98 mmol/L (ref 96–106)

## 2022-08-20 LAB — LIPID PANEL

## 2022-08-20 LAB — BAYER DCA HB A1C WAIVED: HB A1C (BAYER DCA - WAIVED): 7.3 % — ABNORMAL HIGH (ref 4.8–5.6)

## 2022-08-20 MED ORDER — AMLODIPINE BESY-BENAZEPRIL HCL 5-20 MG PO CAPS: 1.0000 | ORAL_CAPSULE | Freq: Every day | ORAL | 1 refills | Status: DC

## 2022-08-20 MED ORDER — METFORMIN HCL 1000 MG PO TABS: 1000.0000 mg | ORAL_TABLET | Freq: Two times a day (BID) | ORAL | 1 refills | Status: DC

## 2022-08-20 MED ORDER — SITAGLIPTIN PHOSPHATE 100 MG PO TABS
100.0000 mg | ORAL_TABLET | Freq: Every day | ORAL | 1 refills | Status: DC
Start: 2022-08-20 — End: 2022-12-05

## 2022-08-20 MED ORDER — ACCU-CHEK AVIVA PLUS VI STRP: ORAL_STRIP | 3 refills | Status: DC

## 2022-08-20 MED ORDER — KETOROLAC TROMETHAMINE 60 MG/2ML IM SOLN
60.0000 mg | Freq: Once | INTRAMUSCULAR | Status: AC
Start: 2022-08-20 — End: 2022-08-20
  Administered 2022-08-20: 60 mg via INTRAMUSCULAR

## 2022-08-20 MED ORDER — EZETIMIBE 10 MG PO TABS: 10.0000 mg | ORAL_TABLET | Freq: Every day | ORAL | 1 refills | Status: DC

## 2022-08-20 MED ORDER — ROSUVASTATIN CALCIUM 20 MG PO TABS: 20.0000 mg | ORAL_TABLET | Freq: Every day | ORAL | 1 refills | Status: DC

## 2022-08-20 MED ORDER — METHYLPREDNISOLONE ACETATE 80 MG/ML IJ SUSP
80.0000 mg | Freq: Once | INTRAMUSCULAR | Status: AC
Start: 2022-08-20 — End: 2022-08-20
  Administered 2022-08-20: 80 mg via INTRAMUSCULAR

## 2022-08-20 MED ORDER — GLIMEPIRIDE 4 MG PO TABS: 8.0000 mg | ORAL_TABLET | Freq: Every day | ORAL | 0 refills | Status: DC

## 2022-08-20 NOTE — Patient Instructions (Signed)
Hip Pain The hip is the joint between the upper legs and the lower pelvis. The bones, cartilage, tendons, and muscles of your hip joint support your body and allow you to move around. Hip pain can range from a minor ache to severe pain in one or both of your hips. The pain may be felt on the inside of the hip joint near the groin, or on the outside near the buttocks and upper thigh. You may also have swelling or stiffness in your hip area. Follow these instructions at home: Managing pain, stiffness, and swelling     If told, put ice on the painful area. Put ice in a plastic bag. Place a towel between your skin and the bag. Leave the ice on for 20 minutes, 2-3 times a day. If told, apply heat to the affected area as often as told by your health care provider. Use the heat source that your provider recommends, such as a moist heat pack or a heating pad. Place a towel between your skin and the heat source. Leave the heat on for 20-30 minutes. If your skin turns bright red, remove the ice or heat right away to prevent skin damage. The risk of damage is higher if you cannot feel pain, heat, or cold. Activity Do exercises as told by your provider. Avoid activities that cause pain. General instructions  Take over-the-counter and prescription medicines only as told by your provider. Keep a journal of your symptoms. Write down: How often you have hip pain. The location of your pain. What the pain feels like. What makes the pain worse. Sleep with a pillow between your legs on your most comfortable side. Keep all follow-up visits. Your provider will monitor your pain and activity. Contact a health care provider if: You cannot put weight on your leg. Your pain or swelling gets worse after a week. It gets harder to walk. You have a fever. Get help right away if: You fall. You have a sudden increase in pain and swelling in your hip. Your hip is red or swollen or very tender to touch. This  information is not intended to replace advice given to you by your health care provider. Make sure you discuss any questions you have with your health care provider. Document Revised: 09/25/2021 Document Reviewed: 09/25/2021 Elsevier Patient Education  2024 [employer] Inc[employer]  

## 2022-08-20 NOTE — Progress Notes (Signed)
Subjective:    Patient ID: Rodney Glover, male    DOB: [birth year], 72 y.o.   MRN: [id number]   Chief Complaint: medical management of chronic issues     HPI:  Rodney Glover is a 72 y.o. who identifies as a male who was assigned male at birth.   Social history: Lives with: wife Work history: retired   Water engineer in today for follow up of the following chronic medical issues:  1. Primary hypertension No c/o chest pain, sob or headache. Doe snot check blood pressure at home. BP Readings from Last 3 Encounters:  04/02/22 (!) 144/71  12/13/21 123/70  09/11/21 135/81     2. Hyperlipidemia associated with type 2 diabetes mellitus (HCC) Does watch diet and exercises 3x a week. Lab Results  Component Value Date   CHOL 128 04/02/2022   HDL 46 04/02/2022   LDLCALC 69 04/02/2022   TRIG 61 04/02/2022   CHOLHDL 2.8 04/02/2022     3. Diabetes mellitus treated with oral medication (HCC) Fasting blood sugars are running around 130-160. No low blood sugars. No change made at last visit to meds. Patient wanted to do diet and exercise. Lab Results  Component Value Date   HGBA1C 10.2 (H) 04/02/2022     4. BMI 28.0-28.9,adult No recent weight changes Wt Readings from Last 3 Encounters:  08/20/22 156 lb (70.8 kg)  04/02/22 158 lb (71.7 kg)  12/13/21 156 lb (70.8 kg)   BMI Readings from Last 3 Encounters:  08/20/22 24.43 kg/m  04/02/22 24.75 kg/m  12/13/21 24.43 kg/m      New complaints: None today  Allergies  Allergen Reactions   Niaspan [Niacin Er] Itching   Penicillins     REACTION: hives   Outpatient Encounter Medications as of 08/20/2022  Medication Sig   Accu-Chek Softclix Lancets lancets Test BS daily Dx E11.9   Alcohol Swabs (B-D SINGLE USE SWABS REGULAR) PADS Test BS daily Dx E11.9   amLODipine-benazepril (LOTREL) 5-20 MG capsule Take 1 capsule by mouth daily.   aspirin 81 MG tablet Take 81 mg by mouth daily.   Blood Glucose Monitoring Suppl  (ACCU-CHEK AVIVA PLUS) w/Device KIT Use to check blood sugar daily   Cholecalciferol (VITAMIN D-3) 5000 UNITS TABS Take 10,000 each by mouth in the morning and at bedtime.   cyanocobalamin 1000 MCG tablet Take 1,000 mcg by mouth daily.   ezetimibe (ZETIA) 10 MG tablet Take 1 tablet (10 mg total) by mouth daily.   fish oil-omega-3 fatty acids 1000 MG capsule Take 2 g by mouth daily. 4 per day 1000mg  per day   fluticasone (FLONASE) 50 MCG/ACT nasal spray Place 2 sprays into both nostrils daily.   glimepiride (AMARYL) 4 MG tablet Take 2 tablets (8 mg total) by mouth daily with breakfast.   glucose blood (ACCU-CHEK AVIVA PLUS) test strip TEST BLOOD SUGAR EVERY DAY   Melatonin 10 MG TABS    metFORMIN (GLUCOPHAGE) 1000 MG tablet Take 1 tablet (1,000 mg total) by mouth 2 (two) times daily with a meal.   Multiple Vitamin (MULTIVITAMIN) capsule Take 1 capsule by mouth daily.   rosuvastatin (CRESTOR) 20 MG tablet Take 1 tablet (20 mg total) by mouth daily.   sitaGLIPtin (JANUVIA) 100 MG tablet Take 1 tablet (100 mg total) by mouth daily.   TURMERIC PO Take by mouth.   No facility-administered encounter medications on file as of 08/20/2022.    Past Surgical History:  Procedure Laterality Date   APPENDECTOMY  COLONOSCOPY     HERNIA REPAIR     KNEE SURGERY Bilateral 12/07/2012   Dr. Shelle Iron   POLYPECTOMY      Family History  Problem Relation Age of Onset   Hyperlipidemia Mother    Heart disease Father    Cancer Father        bladder, and prostate   Stroke Father    Coronary artery disease Father    Prostate cancer Father    Colon cancer Father    Hyperlipidemia Sister    Hypertension Sister    Prostate cancer Other    Diabetes Other    Heart disease Other    Esophageal cancer Neg Hx    Rectal cancer Neg Hx    Stomach cancer Neg Hx    Colon polyps Neg Hx       Controlled substance contract: n/a     Review of Systems  Constitutional:  Negative for diaphoresis.  Eyes:   Negative for pain.  Respiratory:  Negative for shortness of breath.   Cardiovascular:  Negative for chest pain, palpitations and leg swelling.  Gastrointestinal:  Negative for abdominal pain.  Endocrine: Negative for polydipsia.  Skin:  Negative for rash.  Neurological:  Negative for dizziness, weakness and headaches.  Hematological:  Does not bruise/bleed easily.  All other systems reviewed and are negative.      Objective:   Physical Exam Vitals and nursing note reviewed.  Constitutional:      Appearance: Normal appearance. He is well-developed.  HENT:     Head: Normocephalic.     Nose: Nose normal.     Mouth/Throat:     Mouth: Mucous membranes are moist.     Pharynx: Oropharynx is clear.  Eyes:     Pupils: Pupils are equal, round, and reactive to light.  Neck:     Thyroid: No thyroid mass or thyromegaly.     Vascular: No carotid bruit or JVD.     Trachea: Phonation normal.  Cardiovascular:     Rate and Rhythm: Normal rate and regular rhythm.  Pulmonary:     Effort: Pulmonary effort is normal. No respiratory distress.     Breath sounds: Normal breath sounds.  Abdominal:     General: Bowel sounds are normal.     Palpations: Abdomen is soft.     Tenderness: There is no abdominal tenderness.  Musculoskeletal:        General: Normal range of motion.     Cervical back: Normal range of motion and neck supple.  Lymphadenopathy:     Cervical: No cervical adenopathy.  Skin:    General: Skin is warm and dry.  Neurological:     Mental Status: He is alert and oriented to person, place, and time.  Psychiatric:        Behavior: Behavior normal.        Thought Content: Thought content normal.        Judgment: Judgment normal.      BP 134/73   Pulse (!) 58   Temp 97.7 F (36.5 C) (Temporal)   Resp 20   Ht 5\' 7"  (1.702 m)   Wt 156 lb (70.8 kg)   SpO2 98%   BMI 24.43 kg/m    Hgba1c 7.3%    Assessment & Plan:  Rodney Glover comes in today with chief complaint of  Medical Management of Chronic Issues and ? sciatic pain   Diagnosis and orders addressed:  1. Primary hypertension Low sodium diet - CBC with  Differential/Platelet - CMP14+EGFR - amLODipine-benazepril (LOTREL) 5-20 MG capsule; Take 1 capsule by mouth daily.  Dispense: 90 capsule; Refill: 1  2. Hyperlipidemia associated with type 2 diabetes mellitus (HCC) Low fat diet - Lipid panel - ezetimibe (ZETIA) 10 MG tablet; Take 1 tablet (10 mg total) by mouth daily.  Dispense: 90 tablet; Refill: 1 - rosuvastatin (CRESTOR) 20 MG tablet; Take 1 tablet (20 mg total) by mouth daily.  Dispense: 90 tablet; Refill: 1  3. Diabetes mellitus treated with oral medication (HCC) Continue to watch carbs in diet - Bayer DCA Hb A1c Waived - glimepiride (AMARYL) 4 MG tablet; Take 2 tablets (8 mg total) by mouth daily with breakfast.  Dispense: 180 tablet; Refill: 0 - glucose blood (ACCU-CHEK AVIVA PLUS) test strip; TEST BLOOD SUGAR EVERY DAY  Dispense: 100 strip; Refill: 3 - metFORMIN (GLUCOPHAGE) 1000 MG tablet; Take 1 tablet (1,000 mg total) by mouth 2 (two) times daily with a meal.  Dispense: 180 tablet; Refill: 1 - sitaGLIPtin (JANUVIA) 100 MG tablet; Take 1 tablet (100 mg total) by mouth daily.  Dispense: 90 tablet; Refill: 1  4. BMI 28.0-28.9,adult Discussed diet and exercise for person with BMI >25 Will recheck weight in 3-6 months   5. Left hip pain moist heat  rest - DG HIP UNILAT W OR W/O PELVIS 2-3 VIEWS LEFT - methylPREDNISolone acetate (DEPO-MEDROL) injection 80 mg - ketorolac (TORADOL) injection 60 mg   Labs pending Health Maintenance reviewed Diet and exercise encouraged  Follow up plan: 3 months   Mary-Margaret Daphine Deutscher, FNP

## 2022-08-21 LAB — CBC WITH DIFFERENTIAL/PLATELET
Basophils Absolute: 0 10*3/uL (ref 0.0–0.2)
Basos: 0 %
Eos: 3 %
Hematocrit: 42.1 % (ref 37.5–51.0)
Immature Grans (Abs): 0 10*3/uL (ref 0.0–0.1)
Immature Granulocytes: 0 %
Lymphocytes Absolute: 1.9 10*3/uL (ref 0.7–3.1)
Lymphs: 23 %
MCH: 30.5 pg (ref 26.6–33.0)
MCHC: 33.5 g/dL (ref 31.5–35.7)
MCV: 91 fL (ref 79–97)
Monocytes Absolute: 0.6 10*3/uL (ref 0.1–0.9)
Monocytes: 8 %
Neutrophils Absolute: 5.4 10*3/uL (ref 1.4–7.0)
Neutrophils: 66 %
Platelets: 205 10*3/uL (ref 150–450)
RBC: 4.63 x10E6/uL (ref 4.14–5.80)
WBC: 8.2 10*3/uL (ref 3.4–10.8)

## 2022-08-21 LAB — CMP14+EGFR
ALT: 35 IU/L (ref 0–44)
Albumin: 4.4 g/dL (ref 3.8–4.8)
Alkaline Phosphatase: 71 IU/L (ref 44–121)
BUN/Creatinine Ratio: 14 (ref 10–24)
BUN: 11 mg/dL (ref 8–27)
Bilirubin Total: 0.4 mg/dL (ref 0.0–1.2)
CO2: 24 mmol/L (ref 20–29)
Calcium: 9.1 mg/dL (ref 8.6–10.2)
Creatinine, Ser: 0.81 mg/dL (ref 0.76–1.27)
Globulin, Total: 2.4 g/dL (ref 1.5–4.5)
Glucose: 137 mg/dL — ABNORMAL HIGH (ref 70–99)
Potassium: 4.7 mmol/L (ref 3.5–5.2)
Sodium: 138 mmol/L (ref 134–144)
Total Protein: 6.8 g/dL (ref 6.0–8.5)
eGFR: 94 mL/min/{1.73_m2} (ref >59)

## 2022-08-21 LAB — LIPID PANEL
Chol/HDL Ratio: 2.9 ratio (ref 0.0–5.0)
Cholesterol, Total: 123 mg/dL (ref 100–199)
HDL: 43 mg/dL (ref >39)
Triglycerides: 67 mg/dL (ref 0–149)
VLDL Cholesterol Cal: 14 mg/dL (ref 5–40)

## 2022-10-22 ENCOUNTER — Other Ambulatory Visit: Payer: Self-pay

## 2022-10-22 DIAGNOSIS — E119 Type 2 diabetes mellitus without complications: Secondary | ICD-10-CM

## 2022-11-11 ENCOUNTER — Ambulatory Visit: Payer: Medicare PPO | Admitting: Nurse Practitioner

## 2022-12-05 ENCOUNTER — Ambulatory Visit: Payer: Medicare PPO | Admitting: Nurse Practitioner

## 2022-12-05 ENCOUNTER — Encounter: Payer: Self-pay | Admitting: Nurse Practitioner

## 2022-12-05 VITALS — BP 133/74 | HR 62 | Temp 97.9°F | Resp 20 | Ht 67.0 in | Wt 157.0 lb

## 2022-12-05 DIAGNOSIS — I10 Essential (primary) hypertension: Secondary | ICD-10-CM

## 2022-12-05 DIAGNOSIS — E785 Hyperlipidemia, unspecified: Secondary | ICD-10-CM | POA: Diagnosis not present

## 2022-12-05 DIAGNOSIS — E119 Type 2 diabetes mellitus without complications: Secondary | ICD-10-CM | POA: Diagnosis not present

## 2022-12-05 DIAGNOSIS — E1169 Type 2 diabetes mellitus with other specified complication: Secondary | ICD-10-CM | POA: Diagnosis not present

## 2022-12-05 DIAGNOSIS — Z6828 Body mass index (BMI) 28.0-28.9, adult: Secondary | ICD-10-CM

## 2022-12-05 DIAGNOSIS — E782 Mixed hyperlipidemia: Secondary | ICD-10-CM

## 2022-12-05 DIAGNOSIS — Z7984 Long term (current) use of oral hypoglycemic drugs: Secondary | ICD-10-CM | POA: Diagnosis not present

## 2022-12-05 LAB — BAYER DCA HB A1C WAIVED: HB A1C (BAYER DCA - WAIVED): 7.7 % — ABNORMAL HIGH (ref 4.8–5.6)

## 2022-12-05 MED ORDER — GLIMEPIRIDE 4 MG PO TABS
8.0000 mg | ORAL_TABLET | Freq: Every day | ORAL | 0 refills | Status: DC
Start: 2022-12-05 — End: 2023-06-12

## 2022-12-05 MED ORDER — EZETIMIBE 10 MG PO TABS: 10.0000 mg | ORAL_TABLET | Freq: Every day | ORAL | 1 refills | Status: DC

## 2022-12-05 MED ORDER — METFORMIN HCL 1000 MG PO TABS
1000.0000 mg | ORAL_TABLET | Freq: Two times a day (BID) | ORAL | 1 refills | Status: DC
Start: 2022-12-05 — End: 2023-06-12

## 2022-12-05 MED ORDER — SITAGLIPTIN PHOSPHATE 100 MG PO TABS: 100.0000 mg | ORAL_TABLET | Freq: Every day | ORAL | 1 refills | Status: DC

## 2022-12-05 MED ORDER — ROSUVASTATIN CALCIUM 20 MG PO TABS: 20.0000 mg | ORAL_TABLET | Freq: Every day | ORAL | 1 refills | Status: DC

## 2022-12-05 MED ORDER — AMLODIPINE BESY-BENAZEPRIL HCL 5-20 MG PO CAPS: 1.0000 | ORAL_CAPSULE | Freq: Every day | ORAL | 1 refills | Status: DC

## 2022-12-05 NOTE — Patient Instructions (Signed)

## 2022-12-05 NOTE — Progress Notes (Signed)
Subjective:    Patient ID: Rodney Glover, male    DOB: Jun 16, 1950, 72 y.o.   MRN: [id number]   Chief Complaint: medical management of chronic issues     HPI:  Rodney Glover is a 72 y.o. who identifies as a male who was assigned male at birth.   Social history: Lives with: wife Work history: retired- Theme park manager   Comes in today for follow up of the following chronic medical issues:  1. Primary hypertension No c/o chest pain, sob or headache. Does not check blood pressure at home. BP Readings from Last 3 Encounters:  08/20/22 134/73  04/02/22 (!) 144/71  12/13/21 123/70     2. Hyperlipidemia associated with type 2 diabetes mellitus (HCC) Does try to watch diet and stays active. Lab Results  Component Value Date   CHOL 123 08/20/2022   HDL 43 08/20/2022   LDLCALC 66 08/20/2022   TRIG 67 08/20/2022   CHOLHDL 2.9 08/20/2022      3. Diabetes mellitus treated with oral medication (HCC) Fasting blood sugars are running around 150's. Lab Results  Component Value Date   HGBA1C 7.3 (H) 08/20/2022     4. BMI 28.0-28.9,adult No recent weight changes Wt Readings from Last 3 Encounters:  12/05/22 157 lb (71.2 kg)  08/20/22 156 lb (70.8 kg)  04/02/22 158 lb (71.7 kg)   BMI Readings from Last 3 Encounters:  12/05/22 24.59 kg/m  08/20/22 24.43 kg/m  04/02/22 24.75 kg/m      New complaints: None today  Allergies  Allergen Reactions   Niaspan [Niacin Er (Antihyperlipidemic)] Itching   Penicillins     REACTION: hives   Outpatient Encounter Medications as of 12/05/2022  Medication Sig   Accu-Chek Softclix Lancets lancets Test BS daily Dx E11.9   Alcohol Swabs (B-D SINGLE USE SWABS REGULAR) PADS Test BS daily Dx E11.9   amLODipine-benazepril (LOTREL) 5-20 MG capsule Take 1 capsule by mouth daily.   aspirin 81 MG tablet Take 81 mg by mouth daily.   Blood Glucose Monitoring Suppl (ACCU-CHEK AVIVA PLUS) w/Device KIT Use to check blood sugar daily    Cholecalciferol (VITAMIN D-3) 5000 UNITS TABS Take 10,000 each by mouth in the morning and at bedtime.   cyanocobalamin 1000 MCG tablet Take 1,000 mcg by mouth daily.   ezetimibe (ZETIA) 10 MG tablet Take 1 tablet (10 mg total) by mouth daily.   fish oil-omega-3 fatty acids 1000 MG capsule Take 2 g by mouth daily. 4 per day 1000mg  per day   glimepiride (AMARYL) 4 MG tablet Take 2 tablets (8 mg total) by mouth daily with breakfast.   glucose blood (ACCU-CHEK AVIVA PLUS) test strip TEST BLOOD SUGAR EVERY DAY   Melatonin 10 MG TABS    metFORMIN (GLUCOPHAGE) 1000 MG tablet Take 1 tablet (1,000 mg total) by mouth 2 (two) times daily with a meal.   Multiple Vitamin (MULTIVITAMIN) capsule Take 1 capsule by mouth daily.   rosuvastatin (CRESTOR) 20 MG tablet Take 1 tablet (20 mg total) by mouth daily.   sitaGLIPtin (JANUVIA) 100 MG tablet Take 1 tablet (100 mg total) by mouth daily.   TURMERIC PO Take by mouth.   No facility-administered encounter medications on file as of 12/05/2022.    Past Surgical History:  Procedure Laterality Date   APPENDECTOMY     COLONOSCOPY     HERNIA REPAIR     KNEE SURGERY Bilateral 12/07/2012   Dr. Shelle Iron   POLYPECTOMY  Family History  Problem Relation Age of Onset   Hyperlipidemia Mother    Heart disease Father    Cancer Father        bladder, and prostate   Stroke Father    Coronary artery disease Father    Prostate cancer Father    Colon cancer Father    Hyperlipidemia Sister    Hypertension Sister    Prostate cancer Other    Diabetes Other    Heart disease Other    Esophageal cancer Neg Hx    Rectal cancer Neg Hx    Stomach cancer Neg Hx    Colon polyps Neg Hx       Controlled substance contract: n/a     Review of Systems  Constitutional:  Negative for diaphoresis.  Eyes:  Negative for pain.  Respiratory:  Negative for shortness of breath.   Cardiovascular:  Negative for chest pain, palpitations and leg swelling.   Gastrointestinal:  Negative for abdominal pain.  Endocrine: Negative for polydipsia.  Skin:  Negative for rash.  Neurological:  Negative for dizziness, weakness and headaches.  Hematological:  Does not bruise/bleed easily.  All other systems reviewed and are negative.      Objective:   Physical Exam Vitals and nursing note reviewed.  Constitutional:      Appearance: Normal appearance. He is well-developed.  HENT:     Head: Normocephalic.     Nose: Nose normal.     Mouth/Throat:     Mouth: Mucous membranes are moist.     Pharynx: Oropharynx is clear.  Eyes:     Pupils: Pupils are equal, round, and reactive to light.  Neck:     Thyroid: No thyroid mass or thyromegaly.     Vascular: No carotid bruit or JVD.     Trachea: Phonation normal.  Cardiovascular:     Rate and Rhythm: Normal rate and regular rhythm.  Pulmonary:     Effort: Pulmonary effort is normal. No respiratory distress.     Breath sounds: Normal breath sounds.  Abdominal:     General: Bowel sounds are normal.     Palpations: Abdomen is soft.     Tenderness: There is no abdominal tenderness.  Musculoskeletal:        General: Normal range of motion.     Cervical back: Normal range of motion and neck supple.  Lymphadenopathy:     Cervical: No cervical adenopathy.  Skin:    General: Skin is warm and dry.  Neurological:     Mental Status: He is alert and oriented to person, place, and time.  Psychiatric:        Behavior: Behavior normal.        Thought Content: Thought content normal.        Judgment: Judgment normal.    BP 133/74   Pulse 62   Temp 97.9 F (36.6 C) (Temporal)   Resp 20   Ht 5\' 7"  (1.702 m)   Wt 157 lb (71.2 kg)   SpO2 97%   BMI 24.59 kg/m   Hgba1c 7.7%      Assessment & Plan:   Rodney Glover comes in today with chief complaint of Medical Management of Chronic Issues   Diagnosis and orders addressed:  1. Primary hypertension Low sodium diet - CBC with  Differential/Platelet - CMP14+EGFR - amLODipine-benazepril (LOTREL) 5-20 MG capsule; Take 1 capsule by mouth daily.  Dispense: 90 capsule; Refill: 1  2. Hyperlipidemia associated with type 2 diabetes mellitus (HCC) Low  fat diet - Lipid panel - ezetimibe (ZETIA) 10 MG tablet; Take 1 tablet (10 mg total) by mouth daily.  Dispense: 90 tablet; Refill: 1 - rosuvastatin (CRESTOR) 20 MG tablet; Take 1 tablet (20 mg total) by mouth daily.  Dispense: 90 tablet; Refill: 1  3. Diabetes mellitus treated with oral medication (HCC) Continue low carb diet - Bayer DCA Hb A1c Waived - glimepiride (AMARYL) 4 MG tablet; Take 2 tablets (8 mg total) by mouth daily with breakfast.  Dispense: 180 tablet; Refill: 0 - metFORMIN (GLUCOPHAGE) 1000 MG tablet; Take 1 tablet (1,000 mg total) by mouth 2 (two) times daily with a meal.  Dispense: 180 tablet; Refill: 1 - sitaGLIPtin (JANUVIA) 100 MG tablet; Take 1 tablet (100 mg total) by mouth daily.  Dispense: 90 tablet; Refill: 1  4. BMI 28.0-28.9,adult Discussed diet and exercise for person with BMI >25 Will recheck weight in 3-6 months   Labs pending Health Maintenance reviewed Diet and exercise encouraged  Follow up plan: 3 months   Mary-Margaret Daphine Deutscher, FNP

## 2022-12-06 LAB — CBC WITH DIFFERENTIAL/PLATELET
Basophils Absolute: 0.1 10*3/uL (ref 0.0–0.2)
Basos: 1 %
EOS (ABSOLUTE): 0.5 10*3/uL — ABNORMAL HIGH (ref 0.0–0.4)
Eos: 6 %
Hematocrit: 44 % (ref 37.5–51.0)
Hemoglobin: 14.6 g/dL (ref 13.0–17.7)
Immature Grans (Abs): 0 10*3/uL (ref 0.0–0.1)
Immature Granulocytes: 1 %
Lymphocytes Absolute: 2.3 10*3/uL (ref 0.7–3.1)
Lymphs: 30 %
MCH: 30.9 pg (ref 26.6–33.0)
MCHC: 33.2 g/dL (ref 31.5–35.7)
MCV: 93 fL (ref 79–97)
Monocytes Absolute: 0.6 10*3/uL (ref 0.1–0.9)
Monocytes: 8 %
Neutrophils Absolute: 4.1 10*3/uL (ref 1.4–7.0)
Neutrophils: 54 %
Platelets: 229 10*3/uL (ref 150–450)
RBC: 4.72 x10E6/uL (ref 4.14–5.80)
RDW: 12.6 % (ref 11.6–15.4)
WBC: 7.5 10*3/uL (ref 3.4–10.8)

## 2022-12-06 LAB — LIPID PANEL
Chol/HDL Ratio: 3 ratio (ref 0.0–5.0)
Cholesterol, Total: 129 mg/dL (ref 100–199)
HDL: 43 mg/dL (ref >39)
LDL Chol Calc (NIH): 70 mg/dL (ref 0–99)
Triglycerides: 81 mg/dL (ref 0–149)
VLDL Cholesterol Cal: 16 mg/dL (ref 5–40)

## 2022-12-06 LAB — CMP14+EGFR
ALT: 35 [IU]/L (ref 0–44)
AST: 33 [IU]/L (ref 0–40)
Albumin: 4.3 g/dL (ref 3.8–4.8)
Alkaline Phosphatase: 74 [IU]/L (ref 44–121)
BUN/Creatinine Ratio: 14 (ref 10–24)
BUN: 12 mg/dL (ref 8–27)
Bilirubin Total: 0.4 mg/dL (ref 0.0–1.2)
CO2: 25 mmol/L (ref 20–29)
Calcium: 9.4 mg/dL (ref 8.6–10.2)
Chloride: 99 mmol/L (ref 96–106)
Creatinine, Ser: 0.84 mg/dL (ref 0.76–1.27)
Globulin, Total: 2.4 g/dL (ref 1.5–4.5)
Glucose: 157 mg/dL — ABNORMAL HIGH (ref 70–99)
Potassium: 4.6 mmol/L (ref 3.5–5.2)
Sodium: 137 mmol/L (ref 134–144)
Total Protein: 6.7 g/dL (ref 6.0–8.5)
eGFR: 93 mL/min/{1.73_m2} (ref >59)

## 2022-12-17 DIAGNOSIS — Z1283 Encounter for screening for malignant neoplasm of skin: Secondary | ICD-10-CM | POA: Diagnosis not present

## 2022-12-17 DIAGNOSIS — I781 Nevus, non-neoplastic: Secondary | ICD-10-CM | POA: Diagnosis not present

## 2022-12-17 DIAGNOSIS — L570 Actinic keratosis: Secondary | ICD-10-CM | POA: Diagnosis not present

## 2022-12-17 DIAGNOSIS — Z85828 Personal history of other malignant neoplasm of skin: Secondary | ICD-10-CM | POA: Diagnosis not present

## 2023-01-22 NOTE — Telephone Encounter (Signed)
Results abstracted and Care Team up to date

## 2023-03-14 ENCOUNTER — Encounter: Payer: Self-pay | Admitting: Nurse Practitioner

## 2023-03-14 ENCOUNTER — Ambulatory Visit: Payer: Medicare PPO | Admitting: Nurse Practitioner

## 2023-03-14 VITALS — BP 134/75 | HR 67 | Temp 98.0°F | Ht 67.0 in | Wt 158.0 lb

## 2023-03-14 DIAGNOSIS — Z7984 Long term (current) use of oral hypoglycemic drugs: Secondary | ICD-10-CM

## 2023-03-14 DIAGNOSIS — Z125 Encounter for screening for malignant neoplasm of prostate: Secondary | ICD-10-CM

## 2023-03-14 DIAGNOSIS — E785 Hyperlipidemia, unspecified: Secondary | ICD-10-CM | POA: Diagnosis not present

## 2023-03-14 DIAGNOSIS — I10 Essential (primary) hypertension: Secondary | ICD-10-CM | POA: Diagnosis not present

## 2023-03-14 DIAGNOSIS — E1169 Type 2 diabetes mellitus with other specified complication: Secondary | ICD-10-CM

## 2023-03-14 DIAGNOSIS — E119 Type 2 diabetes mellitus without complications: Secondary | ICD-10-CM

## 2023-03-14 LAB — LIPID PANEL

## 2023-03-14 LAB — BAYER DCA HB A1C WAIVED: HB A1C (BAYER DCA - WAIVED): 9.5 % — ABNORMAL HIGH (ref 4.8–5.6)

## 2023-03-14 MED ORDER — DAPAGLIFLOZIN PROPANEDIOL 10 MG PO TABS: 10.0000 mg | ORAL_TABLET | Freq: Every day | ORAL | 3 refills | Status: DC

## 2023-03-14 NOTE — Patient Instructions (Signed)

## 2023-03-14 NOTE — Progress Notes (Signed)
 Subjective:    Patient ID: Rodney Glover, male    DOB: 04-12-1950, 73 y.o.   MRN: [id number]   Chief Complaint: medical management of chronic issues     HPI:  Rodney Glover is a 73 y.o. who identifies as a male who was assigned male at birth.   Social history: Lives with: wife Work history: retired- theme park manager   Comes in today for follow up of the following chronic medical issues:  1. Primary hypertension No c/o chest pain, sob or headache. Does not check blood pressure at home. BP Readings from Last 3 Encounters:  12/05/22 133/74  08/20/22 134/73  04/02/22 (!) 144/71     2. Hyperlipidemia associated with type 2 diabetes mellitus (HCC) Does try to watch diet and stays active. Lab Results  Component Value Date   CHOL 129 12/05/2022   HDL 43 12/05/2022   LDLCALC 70 12/05/2022   TRIG 81 12/05/2022   CHOLHDL 3.0 12/05/2022      3. Diabetes mellitus treated with oral medication (HCC) Fasting blood sugars are running around 150's-190 Lab Results  Component Value Date   HGBA1C 7.7 (H) 12/05/2022     4. BMI 28.0-28.9,adult No recent weight changes  Wt Readings from Last 3 Encounters:  03/14/23 158 lb (71.7 kg)  12/05/22 157 lb (71.2 kg)  08/20/22 156 lb (70.8 kg)   BMI Readings from Last 3 Encounters:  03/14/23 24.75 kg/m  12/05/22 24.59 kg/m  08/20/22 24.43 kg/m       New complaints: None today  Allergies  Allergen Reactions   Niaspan [Niacin Er (Antihyperlipidemic)] Itching   Penicillins     REACTION: hives   Outpatient Encounter Medications as of 03/14/2023  Medication Sig   Accu-Chek Softclix Lancets lancets Test BS daily Dx E11.9   Alcohol Swabs (B-D SINGLE USE SWABS REGULAR) PADS Test BS daily Dx E11.9   amLODipine -benazepril  (LOTREL) 5-20 MG capsule Take 1 capsule by mouth daily.   aspirin 81 MG tablet Take 81 mg by mouth daily.   Blood Glucose Monitoring Suppl (ACCU-CHEK AVIVA PLUS) w/Device KIT Use to check blood sugar  daily   Cholecalciferol (VITAMIN D -3) 5000 UNITS TABS Take 10,000 each by mouth in the morning and at bedtime.   cyanocobalamin  1000 MCG tablet Take 1,000 mcg by mouth daily.   ezetimibe  (ZETIA ) 10 MG tablet Take 1 tablet (10 mg total) by mouth daily.   fish oil-omega-3 fatty acids 1000 MG capsule Take 2 g by mouth daily. 4 per day 1000mg  per day   glimepiride  (AMARYL ) 4 MG tablet Take 2 tablets (8 mg total) by mouth daily with breakfast.   glucose blood (ACCU-CHEK AVIVA PLUS) test strip TEST BLOOD SUGAR EVERY DAY   Melatonin 10 MG TABS    metFORMIN  (GLUCOPHAGE ) 1000 MG tablet Take 1 tablet (1,000 mg total) by mouth 2 (two) times daily with a meal.   Multiple Vitamin (MULTIVITAMIN) capsule Take 1 capsule by mouth daily.   rosuvastatin  (CRESTOR ) 20 MG tablet Take 1 tablet (20 mg total) by mouth daily.   sitaGLIPtin  (JANUVIA ) 100 MG tablet Take 1 tablet (100 mg total) by mouth daily.   TURMERIC PO Take by mouth.   No facility-administered encounter medications on file as of 03/14/2023.    Past Surgical History:  Procedure Laterality Date   APPENDECTOMY     COLONOSCOPY     HERNIA REPAIR     KNEE SURGERY Bilateral 12/07/2012   Dr. Duwayne   POLYPECTOMY  Family History  Problem Relation Age of Onset   Hyperlipidemia Mother    Heart disease Father    Cancer Father        bladder, and prostate   Stroke Father    Coronary artery disease Father    Prostate cancer Father    Colon cancer Father    Hyperlipidemia Sister    Hypertension Sister    Prostate cancer Other    Diabetes Other    Heart disease Other    Esophageal cancer Neg Hx    Rectal cancer Neg Hx    Stomach cancer Neg Hx    Colon polyps Neg Hx       Controlled substance contract: n/a     Review of Systems  Constitutional:  Negative for diaphoresis.  Eyes:  Negative for pain.  Respiratory:  Negative for shortness of breath.   Cardiovascular:  Negative for chest pain, palpitations and leg swelling.   Gastrointestinal:  Negative for abdominal pain.  Endocrine: Negative for polydipsia.  Skin:  Negative for rash.  Neurological:  Negative for dizziness, weakness and headaches.  Hematological:  Does not bruise/bleed easily.  All other systems reviewed and are negative.      Objective:   Physical Exam Vitals and nursing note reviewed.  Constitutional:      Appearance: Normal appearance. He is well-developed.  HENT:     Head: Normocephalic.     Nose: Nose normal.     Mouth/Throat:     Mouth: Mucous membranes are moist.     Pharynx: Oropharynx is clear.  Eyes:     Pupils: Pupils are equal, round, and reactive to light.  Neck:     Thyroid: No thyroid mass or thyromegaly.     Vascular: No carotid bruit or JVD.     Trachea: Phonation normal.  Cardiovascular:     Rate and Rhythm: Normal rate and regular rhythm.  Pulmonary:     Effort: Pulmonary effort is normal. No respiratory distress.     Breath sounds: Normal breath sounds.  Abdominal:     General: Bowel sounds are normal.     Palpations: Abdomen is soft.     Tenderness: There is no abdominal tenderness.  Musculoskeletal:        General: Normal range of motion.     Cervical back: Normal range of motion and neck supple.  Lymphadenopathy:     Cervical: No cervical adenopathy.  Skin:    General: Skin is warm and dry.  Neurological:     Mental Status: He is alert and oriented to person, place, and time.  Psychiatric:        Behavior: Behavior normal.        Thought Content: Thought content normal.        Judgment: Judgment normal.    There were no vitals taken for this visit.  Hgba1c 9.5%      Assessment & Plan:   BERGEN MAGNER comes in today with chief complaint of No chief complaint on file.   Diagnosis and orders addressed:  1. Primary hypertension Low sodium diet - CBC with Differential/Platelet - CMP14+EGFR - amLODipine -benazepril  (LOTREL) 5-20 MG capsule; Take 1 capsule by mouth daily.  Dispense: 90  capsule; Refill: 1  2. Hyperlipidemia associated with type 2 diabetes mellitus (HCC) Low fat diet - Lipid panel - ezetimibe  (ZETIA ) 10 MG tablet; Take 1 tablet (10 mg total) by mouth daily.  Dispense: 90 tablet; Refill: 1 - rosuvastatin  (CRESTOR ) 20 MG tablet; Take 1  tablet (20 mg total) by mouth daily.  Dispense: 90 tablet; Refill: 1  3. Diabetes mellitus treated with oral medication (HCC) Continue low carb diet - Bayer DCA Hb A1c Waived - glimepiride  (AMARYL ) 4 MG tablet; Take 2 tablets (8 mg total) by mouth daily with breakfast.  Dispense: 180 tablet; Refill: 0 - metFORMIN  (GLUCOPHAGE ) 1000 MG tablet; Take 1 tablet (1,000 mg total) by mouth 2 (two) times daily with a meal.  Dispense: 180 tablet; Refill: 1 - sitaGLIPtin  (JANUVIA ) 100 MG tablet; Take 1 tablet (100 mg total) by mouth daily.  Dispense: 90 tablet; Refill: 1 - farxiga  10mg  1 po daily #90 1 refill  4. BMI 28.0-28.9,adult Discussed diet and exercise for person with BMI >25 Will recheck weight in 3-6 months   Labs pending Health Maintenance reviewed Diet and exercise encouraged  Follow up plan: 3 months   Mary-Margaret Gladis, FNP

## 2023-03-15 LAB — CBC WITH DIFFERENTIAL/PLATELET
Basophils Absolute: 0.1 10*3/uL (ref 0.0–0.2)
Basos: 1 %
EOS (ABSOLUTE): 0.3 10*3/uL (ref 0.0–0.4)
Eos: 4 %
Hematocrit: 41.3 % (ref 37.5–51.0)
Hemoglobin: 14.1 g/dL (ref 13.0–17.7)
Immature Grans (Abs): 0 10*3/uL (ref 0.0–0.1)
Immature Granulocytes: 0 %
Lymphocytes Absolute: 2.5 10*3/uL (ref 0.7–3.1)
Lymphs: 36 %
MCH: 30.7 pg (ref 26.6–33.0)
MCHC: 34.1 g/dL (ref 31.5–35.7)
MCV: 90 fL (ref 79–97)
Monocytes Absolute: 0.6 10*3/uL (ref 0.1–0.9)
Monocytes: 9 %
Neutrophils Absolute: 3.4 10*3/uL (ref 1.4–7.0)
Neutrophils: 50 %
Platelets: 225 10*3/uL (ref 150–450)
RBC: 4.59 x10E6/uL (ref 4.14–5.80)
RDW: 12.1 % (ref 11.6–15.4)
WBC: 6.9 10*3/uL (ref 3.4–10.8)

## 2023-03-15 LAB — CMP14+EGFR
ALT: 27 [IU]/L (ref 0–44)
AST: 31 [IU]/L (ref 0–40)
Albumin: 4.3 g/dL (ref 3.8–4.8)
Alkaline Phosphatase: 70 [IU]/L (ref 44–121)
BUN/Creatinine Ratio: 11 (ref 10–24)
BUN: 10 mg/dL (ref 8–27)
Bilirubin Total: 0.3 mg/dL (ref 0.0–1.2)
CO2: 23 mmol/L (ref 20–29)
Calcium: 8.9 mg/dL (ref 8.6–10.2)
Chloride: 99 mmol/L (ref 96–106)
Creatinine, Ser: 0.88 mg/dL (ref 0.76–1.27)
Globulin, Total: 2.3 g/dL (ref 1.5–4.5)
Glucose: 158 mg/dL — ABNORMAL HIGH (ref 70–99)
Potassium: 4.4 mmol/L (ref 3.5–5.2)
Sodium: 136 mmol/L (ref 134–144)
Total Protein: 6.6 g/dL (ref 6.0–8.5)
eGFR: 92 mL/min/{1.73_m2} (ref >59)

## 2023-03-15 LAB — LIPID PANEL
Cholesterol, Total: 111 mg/dL (ref 100–199)
HDL: 38 mg/dL — ABNORMAL LOW (ref >39)
LDL CALC COMMENT:: 2.9 ratio (ref 0.0–5.0)
LDL Chol Calc (NIH): 58 mg/dL (ref 0–99)
Triglycerides: 72 mg/dL (ref 0–149)
VLDL Cholesterol Cal: 15 mg/dL (ref 5–40)

## 2023-03-15 LAB — PSA, TOTAL AND FREE
PSA, Free Pct: 25 %
PSA, Free: 0.1 ng/mL
Prostate Specific Ag, Serum: 0.4 ng/mL (ref 0.0–4.0)

## 2023-03-19 DIAGNOSIS — D485 Neoplasm of uncertain behavior of skin: Secondary | ICD-10-CM | POA: Diagnosis not present

## 2023-03-19 DIAGNOSIS — C4440 Unspecified malignant neoplasm of skin of scalp and neck: Secondary | ICD-10-CM | POA: Diagnosis not present

## 2023-04-10 DIAGNOSIS — L905 Scar conditions and fibrosis of skin: Secondary | ICD-10-CM | POA: Diagnosis not present

## 2023-04-10 DIAGNOSIS — C4442 Squamous cell carcinoma of skin of scalp and neck: Secondary | ICD-10-CM | POA: Diagnosis not present

## 2023-05-10 ENCOUNTER — Other Ambulatory Visit: Payer: Self-pay | Admitting: Nurse Practitioner

## 2023-05-10 DIAGNOSIS — E119 Type 2 diabetes mellitus without complications: Secondary | ICD-10-CM

## 2023-06-10 DIAGNOSIS — L570 Actinic keratosis: Secondary | ICD-10-CM | POA: Diagnosis not present

## 2023-06-12 ENCOUNTER — Ambulatory Visit: Payer: Medicare PPO | Admitting: Nurse Practitioner

## 2023-06-12 ENCOUNTER — Encounter: Payer: Self-pay | Admitting: Nurse Practitioner

## 2023-06-12 VITALS — BP 138/75 | HR 63 | Temp 98.6°F | Ht 67.0 in | Wt 152.0 lb

## 2023-06-12 DIAGNOSIS — Z0001 Encounter for general adult medical examination with abnormal findings: Secondary | ICD-10-CM | POA: Diagnosis not present

## 2023-06-12 DIAGNOSIS — E119 Type 2 diabetes mellitus without complications: Secondary | ICD-10-CM | POA: Diagnosis not present

## 2023-06-12 DIAGNOSIS — Z0000 Encounter for general adult medical examination without abnormal findings: Secondary | ICD-10-CM

## 2023-06-12 DIAGNOSIS — I10 Essential (primary) hypertension: Secondary | ICD-10-CM | POA: Diagnosis not present

## 2023-06-12 DIAGNOSIS — Z6828 Body mass index (BMI) 28.0-28.9, adult: Secondary | ICD-10-CM | POA: Diagnosis not present

## 2023-06-12 DIAGNOSIS — Z7984 Long term (current) use of oral hypoglycemic drugs: Secondary | ICD-10-CM

## 2023-06-12 DIAGNOSIS — E785 Hyperlipidemia, unspecified: Secondary | ICD-10-CM

## 2023-06-12 DIAGNOSIS — E1169 Type 2 diabetes mellitus with other specified complication: Secondary | ICD-10-CM

## 2023-06-12 LAB — BAYER DCA HB A1C WAIVED: HB A1C (BAYER DCA - WAIVED): 6.6 % — ABNORMAL HIGH (ref 4.8–5.6)

## 2023-06-12 LAB — LIPID PANEL

## 2023-06-12 MED ORDER — AMLODIPINE BESY-BENAZEPRIL HCL 5-20 MG PO CAPS: 1.0000 | ORAL_CAPSULE | Freq: Every day | ORAL | 1 refills | Status: DC

## 2023-06-12 MED ORDER — ROSUVASTATIN CALCIUM 20 MG PO TABS: 20.0000 mg | ORAL_TABLET | Freq: Every day | ORAL | 1 refills | Status: DC

## 2023-06-12 MED ORDER — GLIMEPIRIDE 4 MG PO TABS: 8.0000 mg | ORAL_TABLET | Freq: Every day | ORAL | 0 refills | Status: DC

## 2023-06-12 MED ORDER — SITAGLIPTIN PHOSPHATE 100 MG PO TABS: 100.0000 mg | ORAL_TABLET | Freq: Every day | ORAL | 1 refills | Status: DC

## 2023-06-12 MED ORDER — EZETIMIBE 10 MG PO TABS: 10.0000 mg | ORAL_TABLET | Freq: Every day | ORAL | 1 refills | Status: DC

## 2023-06-12 MED ORDER — DAPAGLIFLOZIN PROPANEDIOL 10 MG PO TABS: 10.0000 mg | ORAL_TABLET | Freq: Every day | ORAL | 0 refills | Status: DC

## 2023-06-12 MED ORDER — METFORMIN HCL 1000 MG PO TABS: 1000.0000 mg | ORAL_TABLET | Freq: Two times a day (BID) | ORAL | 1 refills | Status: DC

## 2023-06-12 NOTE — Progress Notes (Signed)
 Subjective:    Patient ID: Rodney Glover, male    DOB: September 14, 1950, 73 y.o.   MRN: [id number]   Chief Complaint: annual physical    HPI:  Rodney Glover is a 73 y.o. who identifies as a male who was assigned male at birth.   Social history: Lives with: wife Work history: retired- Theme park manager   Comes in today for follow up of the following chronic medical issues:  1. Primary hypertension No c/o chest pain, sob or headache. Does not check blood pressure at home. BP Readings from Last 3 Encounters:  03/14/23 134/75  12/05/22 133/74  08/20/22 134/73     2. Hyperlipidemia associated with type 2 diabetes mellitus (HCC) Does try to watch diet and stays active. Lab Results  Component Value Date   CHOL 111 03/14/2023   HDL 38 (L) 03/14/2023   LDLCALC 58 03/14/2023   TRIG 72 03/14/2023   CHOLHDL 2.9 03/14/2023      3. Diabetes mellitus treated with oral medication (HCC) Fasting blood sugars are running around 140-160. Patient refused medication changes at last visit. He did start back on farziga in February.  Lab Results  Component Value Date   HGBA1C 9.5 (H) 03/14/2023     4. BMI 28.0-28.9,adult Weight is down 6 lbs Wt Readings from Last 3 Encounters:  06/12/23 152 lb (68.9 kg)  03/14/23 158 lb (71.7 kg)  12/05/22 157 lb (71.2 kg)   BMI Readings from Last 3 Encounters:  06/12/23 23.81 kg/m  03/14/23 24.75 kg/m  12/05/22 24.59 kg/m        New complaints: None today  Allergies  Allergen Reactions   Niaspan [Niacin Er (Antihyperlipidemic)] Itching   Penicillins     REACTION: hives   Outpatient Encounter Medications as of 06/12/2023  Medication Sig   Accu-Chek Softclix Lancets lancets Test BS daily Dx E11.9   Alcohol Swabs (B-D SINGLE USE SWABS REGULAR) PADS Test BS daily Dx E11.9   amLODipine -benazepril  (LOTREL) 5-20 MG capsule Take 1 capsule by mouth daily.   aspirin 81 MG tablet Take 81 mg by mouth daily.   Blood Glucose Monitoring Suppl  (ACCU-CHEK AVIVA PLUS) w/Device KIT Use to check blood sugar daily   Cholecalciferol (VITAMIN D -3) 5000 UNITS TABS Take 10,000 each by mouth in the morning and at bedtime.   cyanocobalamin 1000 MCG tablet Take 1,000 mcg by mouth daily.   dapagliflozin  propanediol (FARXIGA ) 10 MG TABS tablet TAKE 1 TABLET EVERY DAY BEFORE BREAKFAST   ezetimibe  (ZETIA ) 10 MG tablet Take 1 tablet (10 mg total) by mouth daily.   fish oil-omega-3 fatty acids 1000 MG capsule Take 2 g by mouth daily. 4 per day 1000mg  per day   glimepiride  (AMARYL ) 4 MG tablet Take 2 tablets (8 mg total) by mouth daily with breakfast.   glucose blood (ACCU-CHEK AVIVA PLUS) test strip TEST BLOOD SUGAR EVERY DAY   Melatonin 10 MG TABS    metFORMIN  (GLUCOPHAGE ) 1000 MG tablet Take 1 tablet (1,000 mg total) by mouth 2 (two) times daily with a meal.   Multiple Vitamin (MULTIVITAMIN) capsule Take 1 capsule by mouth daily.   rosuvastatin  (CRESTOR ) 20 MG tablet Take 1 tablet (20 mg total) by mouth daily.   sitaGLIPtin  (JANUVIA ) 100 MG tablet Take 1 tablet (100 mg total) by mouth daily.   TURMERIC PO Take by mouth.   No facility-administered encounter medications on file as of 06/12/2023.    Past Surgical History:  Procedure Laterality Date   APPENDECTOMY  COLONOSCOPY     HERNIA REPAIR     KNEE SURGERY Bilateral 12/07/2012   Dr. Leighton Punches   POLYPECTOMY      Family History  Problem Relation Age of Onset   Hyperlipidemia Mother    Heart disease Father    Cancer Father        bladder, and prostate   Stroke Father    Coronary artery disease Father    Prostate cancer Father    Colon cancer Father    Hyperlipidemia Sister    Hypertension Sister    Prostate cancer Other    Diabetes Other    Heart disease Other    Esophageal cancer Neg Hx    Rectal cancer Neg Hx    Stomach cancer Neg Hx    Colon polyps Neg Hx       Controlled substance contract: n/a     Review of Systems  Constitutional:  Negative for diaphoresis.   Eyes:  Negative for pain.  Respiratory:  Negative for shortness of breath.   Cardiovascular:  Negative for chest pain, palpitations and leg swelling.  Gastrointestinal:  Negative for abdominal pain.  Endocrine: Negative for polydipsia.  Skin:  Negative for rash.  Neurological:  Negative for dizziness, weakness and headaches.  Hematological:  Does not bruise/bleed easily.  All other systems reviewed and are negative.      Objective:   Physical Exam Vitals and nursing note reviewed.  Constitutional:      Appearance: Normal appearance. He is well-developed.  HENT:     Head: Normocephalic.     Nose: Nose normal.     Mouth/Throat:     Mouth: Mucous membranes are moist.     Pharynx: Oropharynx is clear.  Eyes:     Pupils: Pupils are equal, round, and reactive to light.  Neck:     Thyroid: No thyroid mass or thyromegaly.     Vascular: No carotid bruit or JVD.     Trachea: Phonation normal.  Cardiovascular:     Rate and Rhythm: Normal rate and regular rhythm.  Pulmonary:     Effort: Pulmonary effort is normal. No respiratory distress.     Breath sounds: Normal breath sounds.  Abdominal:     General: Bowel sounds are normal.     Palpations: Abdomen is soft.     Tenderness: There is no abdominal tenderness.  Musculoskeletal:        General: Normal range of motion.     Cervical back: Normal range of motion and neck supple.  Lymphadenopathy:     Cervical: No cervical adenopathy.  Skin:    General: Skin is warm and dry.  Neurological:     Mental Status: He is alert and oriented to person, place, and time.  Psychiatric:        Behavior: Behavior normal.        Thought Content: Thought content normal.        Judgment: Judgment normal.     BP 138/75   Pulse 63   Temp 98.6 F (37 C) (Temporal)   Ht 5\' 7"  (1.702 m)   Wt 152 lb (68.9 kg)   SpO2 99%   BMI 23.81 kg/m    Hgba1c 6.6%%      Assessment & Plan:   Rodney Glover comes in today with chief complaint of  annual physical  Diagnosis and orders addressed:  1. Primary hypertension Low sodium diet - CBC with Differential/Platelet - CMP14+EGFR - amLODipine -benazepril  (LOTREL) 5-20 MG capsule; Take 1  capsule by mouth daily.  Dispense: 90 capsule; Refill: 1  2. Hyperlipidemia associated with type 2 diabetes mellitus (HCC) Low fat diet - Lipid panel - ezetimibe  (ZETIA ) 10 MG tablet; Take 1 tablet (10 mg total) by mouth daily.  Dispense: 90 tablet; Refill: 1 - rosuvastatin  (CRESTOR ) 20 MG tablet; Take 1 tablet (20 mg total) by mouth daily.  Dispense: 90 tablet; Refill: 1  3. Diabetes mellitus treated with oral medication (HCC) Continue low carb diet - Bayer DCA Hb A1c Waived - glimepiride  (AMARYL ) 4 MG tablet; Take 2 tablets (8 mg total) by mouth daily with breakfast.  Dispense: 180 tablet; Refill: 0 - metFORMIN  (GLUCOPHAGE ) 1000 MG tablet; Take 1 tablet (1,000 mg total) by mouth 2 (two) times daily with a meal.  Dispense: 180 tablet; Refill: 1 - sitaGLIPtin  (JANUVIA ) 100 MG tablet; Take 1 tablet (100 mg total) by mouth daily.  Dispense: 90 tablet; Refill: 1 - farxiga  10mg  1 po daily #90 1 refill  4. BMI 28.0-28.9,adult Discussed diet and exercise for person with BMI >25 Will recheck weight in 3-6 months   Labs pending Health Maintenance reviewed Diet and exercise encouraged  Follow up plan: 3 months   Mary-Margaret Gaylyn Keas, FNP

## 2023-06-12 NOTE — Patient Instructions (Signed)

## 2023-06-13 LAB — CBC WITH DIFFERENTIAL/PLATELET
Basophils Absolute: 0.1 10*3/uL (ref 0.0–0.2)
Basos: 1 %
EOS (ABSOLUTE): 0.3 10*3/uL (ref 0.0–0.4)
Eos: 4 %
Hematocrit: 47 % (ref 37.5–51.0)
Hemoglobin: 15.3 g/dL (ref 13.0–17.7)
Immature Grans (Abs): 0 10*3/uL (ref 0.0–0.1)
Immature Granulocytes: 0 %
Lymphocytes Absolute: 2.2 10*3/uL (ref 0.7–3.1)
Lymphs: 27 %
MCH: 30.2 pg (ref 26.6–33.0)
MCHC: 32.6 g/dL (ref 31.5–35.7)
MCV: 93 fL (ref 79–97)
Monocytes Absolute: 0.7 10*3/uL (ref 0.1–0.9)
Monocytes: 8 %
Neutrophils Absolute: 5.1 10*3/uL (ref 1.4–7.0)
Neutrophils: 60 %
Platelets: 228 10*3/uL (ref 150–450)
RBC: 5.07 x10E6/uL (ref 4.14–5.80)
RDW: 12.8 % (ref 11.6–15.4)
WBC: 8.4 10*3/uL (ref 3.4–10.8)

## 2023-06-13 LAB — CMP14+EGFR
ALT: 35 IU/L (ref 0–44)
AST: 43 IU/L — ABNORMAL HIGH (ref 0–40)
Albumin: 4.8 g/dL (ref 3.8–4.8)
Alkaline Phosphatase: 73 IU/L (ref 44–121)
BUN/Creatinine Ratio: 19 (ref 10–24)
BUN: 16 mg/dL (ref 8–27)
Bilirubin Total: 0.4 mg/dL (ref 0.0–1.2)
CO2: 22 mmol/L (ref 20–29)
Calcium: 9.4 mg/dL (ref 8.6–10.2)
Chloride: 100 mmol/L (ref 96–106)
Creatinine, Ser: 0.85 mg/dL (ref 0.76–1.27)
Globulin, Total: 2.4 g/dL (ref 1.5–4.5)
Glucose: 87 mg/dL (ref 70–99)
Potassium: 5.3 mmol/L — ABNORMAL HIGH (ref 3.5–5.2)
Sodium: 140 mmol/L (ref 134–144)
Total Protein: 7.2 g/dL (ref 6.0–8.5)
eGFR: 92 mL/min/{1.73_m2} (ref >59)

## 2023-06-13 LAB — LIPID PANEL
Chol/HDL Ratio: 2.7 ratio (ref 0.0–5.0)
Cholesterol, Total: 124 mg/dL (ref 100–199)
HDL: 46 mg/dL (ref >39)
LDL Chol Calc (NIH): 63 mg/dL (ref 0–99)
Triglycerides: 77 mg/dL (ref 0–149)
VLDL Cholesterol Cal: 15 mg/dL (ref 5–40)

## 2023-06-13 LAB — VITAMIN B12: Vitamin B-12: >2000 pg/mL — ABNORMAL HIGH (ref 232–1245)

## 2023-06-14 LAB — MICROALBUMIN / CREATININE URINE RATIO
Creatinine, Urine: 40.3 mg/dL
Microalb/Creat Ratio: 9 mg/g{creat} (ref 0–29)
Microalbumin, Urine: 3.6 ug/mL

## 2023-07-22 DIAGNOSIS — H524 Presbyopia: Secondary | ICD-10-CM | POA: Diagnosis not present

## 2023-07-22 DIAGNOSIS — Z7984 Long term (current) use of oral hypoglycemic drugs: Secondary | ICD-10-CM | POA: Diagnosis not present

## 2023-07-22 DIAGNOSIS — E119 Type 2 diabetes mellitus without complications: Secondary | ICD-10-CM | POA: Diagnosis not present

## 2023-07-22 DIAGNOSIS — H5213 Myopia, bilateral: Secondary | ICD-10-CM | POA: Diagnosis not present

## 2023-07-22 DIAGNOSIS — H35363 Drusen (degenerative) of macula, bilateral: Secondary | ICD-10-CM | POA: Diagnosis not present

## 2023-07-22 LAB — HM DIABETES EYE EXAM

## 2023-07-23 ENCOUNTER — Telehealth: Payer: Self-pay | Admitting: Nurse Practitioner

## 2023-07-23 NOTE — Telephone Encounter (Signed)
 Called patient to make Diabetic eye exam. No answer.

## 2023-07-23 NOTE — Telephone Encounter (Signed)
 Pt returning call and states he completed his diabetic eye exam at [facility] yesterday, 07/22/23

## 2023-07-29 ENCOUNTER — Other Ambulatory Visit: Payer: Self-pay | Admitting: Nurse Practitioner

## 2023-07-29 DIAGNOSIS — E119 Type 2 diabetes mellitus without complications: Secondary | ICD-10-CM

## 2023-09-07 ENCOUNTER — Other Ambulatory Visit: Payer: Self-pay | Admitting: Nurse Practitioner

## 2023-09-07 DIAGNOSIS — E119 Type 2 diabetes mellitus without complications: Secondary | ICD-10-CM

## 2023-09-16 ENCOUNTER — Ambulatory Visit: Admitting: Nurse Practitioner

## 2023-09-22 NOTE — Progress Notes (Unsigned)
 Subjective:    Patient ID: Rodney Glover, male    DOB: [birth year], 73 y.o.   MRN: [id number]   Chief Complaint: medical management of chronic issues     HPI:  Rodney Glover is a 73 y.o. who identifies as a male who was assigned male at birth.   Social history: Lives with: wife Work history: retired- Theme park manager   Comes in today for follow up of the following chronic medical issues:  1. Primary hypertension No c/o chest pain, sob or headache. Does not check blood pressure at home. BP Readings from Last 3 Encounters:  06/12/23 138/75  03/14/23 134/75  12/05/22 133/74     2. Hyperlipidemia associated with type 2 diabetes mellitus (HCC) Does try to watch diet and stays active. Lab Results  Component Value Date   CHOL 124 06/12/2023   HDL 46 06/12/2023   LDLCALC 63 06/12/2023   TRIG 77 06/12/2023   CHOLHDL 2.7 06/12/2023      3. Diabetes mellitus treated with oral medication (HCC) Fasting blood sugars averaging 115-120. Lab Results  Component Value Date   HGBA1C 6.6 (H) 06/12/2023     4. BMI 24.0-24.9,adult No recent weight changes  Wt Readings from Last 3 Encounters:  09/23/23 153 lb (69.4 kg)  06/12/23 152 lb (68.9 kg)  03/14/23 158 lb (71.7 kg)   BMI Readings from Last 3 Encounters:  09/23/23 23.96 kg/m  06/12/23 23.81 kg/m  03/14/23 24.75 kg/m        New complaints: None today  Allergies  Allergen Reactions   Niaspan [Niacin Er (Antihyperlipidemic)] Itching   Penicillins     REACTION: hives   Outpatient Encounter Medications as of 09/23/2023  Medication Sig   Accu-Chek Softclix Lancets lancets Test BS daily Dx E11.9   Alcohol Swabs (B-D SINGLE USE SWABS REGULAR) PADS Test BS daily Dx E11.9   amLODipine -benazepril  (LOTREL) 5-20 MG capsule Take 1 capsule by mouth daily.   aspirin 81 MG tablet Take 81 mg by mouth daily.   Blood Glucose Monitoring Suppl (ACCU-CHEK AVIVA PLUS) w/Device KIT Use to check blood sugar daily    Cholecalciferol (VITAMIN D -3) 5000 UNITS TABS Take 10,000 each by mouth in the morning and at bedtime.   cyanocobalamin  1000 MCG tablet Take 1,000 mcg by mouth daily.   dapagliflozin  propanediol (FARXIGA ) 10 MG TABS tablet Take 1 tablet (10 mg total) by mouth daily.   ezetimibe  (ZETIA ) 10 MG tablet Take 1 tablet (10 mg total) by mouth daily.   fish oil-omega-3 fatty acids 1000 MG capsule Take 2 g by mouth daily. 4 per day 1000mg  per day   glimepiride  (AMARYL ) 4 MG tablet TAKE 2 TABLETS EVERY DAY WITH BREAKFAST   glucose blood (ACCU-CHEK AVIVA PLUS) test strip Test BS daily Dx E11.9   Melatonin 10 MG TABS    metFORMIN  (GLUCOPHAGE ) 1000 MG tablet Take 1 tablet (1,000 mg total) by mouth 2 (two) times daily with a meal.   Multiple Vitamin (MULTIVITAMIN) capsule Take 1 capsule by mouth daily.   rosuvastatin  (CRESTOR ) 20 MG tablet Take 1 tablet (20 mg total) by mouth daily.   sitaGLIPtin  (JANUVIA ) 100 MG tablet Take 1 tablet (100 mg total) by mouth daily.   TURMERIC PO Take by mouth.   No facility-administered encounter medications on file as of 09/23/2023.    Past Surgical History:  Procedure Laterality Date   APPENDECTOMY     COLONOSCOPY     HERNIA REPAIR     KNEE SURGERY  Bilateral 12/07/2012   Dr. Duwayne   POLYPECTOMY      Family History  Problem Relation Age of Onset   Hyperlipidemia Mother    Heart disease Father    Cancer Father        bladder, and prostate   Stroke Father    Coronary artery disease Father    Prostate cancer Father    Colon cancer Father    Hyperlipidemia Sister    Hypertension Sister    Prostate cancer Other    Diabetes Other    Heart disease Other    Esophageal cancer Neg Hx    Rectal cancer Neg Hx    Stomach cancer Neg Hx    Colon polyps Neg Hx       Controlled substance contract: n/a     Review of Systems  Constitutional:  Negative for diaphoresis.  Eyes:  Negative for pain.  Respiratory:  Negative for shortness of breath.    Cardiovascular:  Negative for chest pain, palpitations and leg swelling.  Gastrointestinal:  Negative for abdominal pain.  Endocrine: Negative for polydipsia.  Skin:  Negative for rash.  Neurological:  Negative for dizziness, weakness and headaches.  Hematological:  Does not bruise/bleed easily.  All other systems reviewed and are negative.      Objective:   Physical Exam Vitals and nursing note reviewed.  Constitutional:      Appearance: Normal appearance. He is well-developed.  HENT:     Head: Normocephalic.     Nose: Nose normal.     Mouth/Throat:     Mouth: Mucous membranes are moist.     Pharynx: Oropharynx is clear.  Eyes:     Pupils: Pupils are equal, round, and reactive to light.  Neck:     Thyroid: No thyroid mass or thyromegaly.     Vascular: No carotid bruit or JVD.     Trachea: Phonation normal.  Cardiovascular:     Rate and Rhythm: Normal rate and regular rhythm.  Pulmonary:     Effort: Pulmonary effort is normal. No respiratory distress.     Breath sounds: Normal breath sounds.  Abdominal:     General: Bowel sounds are normal.     Palpations: Abdomen is soft.     Tenderness: There is no abdominal tenderness.  Musculoskeletal:        General: Normal range of motion.     Cervical back: Normal range of motion and neck supple.  Lymphadenopathy:     Cervical: No cervical adenopathy.  Skin:    General: Skin is warm and dry.  Neurological:     Mental Status: He is alert and oriented to person, place, and time.  Psychiatric:        Behavior: Behavior normal.        Thought Content: Thought content normal.        Judgment: Judgment normal.    BP 114/65   Pulse 65   Temp 98.9 F (37.2 C) (Temporal)   Ht 5' 7 (1.702 m)   Wt 153 lb (69.4 kg)   SpO2 97%   BMI 23.96 kg/m    Hgba1c 7.4%      Assessment & Plan:   Rodney Glover comes in today with chief complaint of medical management of chronic issues    Diagnosis and orders addressed:  1.  Primary hypertension Low sodium diet - CBC with Differential/Platelet - CMP14+EGFR - amLODipine -benazepril  (LOTREL) 5-20 MG capsule; Take 1 capsule by mouth daily.  Dispense: 90 capsule; Refill:  1  2. Hyperlipidemia associated with type 2 diabetes mellitus (HCC) Low fat diet - Lipid panel - ezetimibe  (ZETIA ) 10 MG tablet; Take 1 tablet (10 mg total) by mouth daily.  Dispense: 90 tablet; Refill: 1 - rosuvastatin  (CRESTOR ) 20 MG tablet; Take 1 tablet (20 mg total) by mouth daily.  Dispense: 90 tablet; Refill: 1  3. Diabetes mellitus treated with oral medication (HCC) Continue low carb diet - Bayer DCA Hb A1c Waived - glimepiride  (AMARYL ) 4 MG tablet; Take 2 tablets (8 mg total) by mouth daily with breakfast.  Dispense: 180 tablet; Refill: 0 - metFORMIN  (GLUCOPHAGE ) 1000 MG tablet; Take 1 tablet (1,000 mg total) by mouth 2 (two) times daily with a meal.  Dispense: 180 tablet; Refill: 1 - sitaGLIPtin  (JANUVIA ) 100 MG tablet; Take 1 tablet (100 mg total) by mouth daily.  Dispense: 90 tablet; Refill: 1 - farxiga  10mg  1 po daily #90 1 refill  4. BMI 28.0-28.9,adult Discussed diet and exercise for person with BMI >25 Will recheck weight in 3-6 months   Labs pending Health Maintenance reviewed Diet and exercise encouraged  Follow up plan: 3 months   Mary-Margaret Gladis, FNP

## 2023-09-23 ENCOUNTER — Ambulatory Visit: Admitting: Nurse Practitioner

## 2023-09-23 ENCOUNTER — Encounter: Payer: Self-pay | Admitting: Nurse Practitioner

## 2023-09-23 VITALS — BP 114/65 | HR 65 | Temp 98.9°F | Ht 67.0 in | Wt 153.0 lb

## 2023-09-23 DIAGNOSIS — E1169 Type 2 diabetes mellitus with other specified complication: Secondary | ICD-10-CM | POA: Diagnosis not present

## 2023-09-23 DIAGNOSIS — E785 Hyperlipidemia, unspecified: Secondary | ICD-10-CM

## 2023-09-23 DIAGNOSIS — Z6828 Body mass index (BMI) 28.0-28.9, adult: Secondary | ICD-10-CM

## 2023-09-23 DIAGNOSIS — Z7984 Long term (current) use of oral hypoglycemic drugs: Secondary | ICD-10-CM

## 2023-09-23 DIAGNOSIS — Z6823 Body mass index (BMI) 23.0-23.9, adult: Secondary | ICD-10-CM | POA: Diagnosis not present

## 2023-09-23 DIAGNOSIS — E119 Type 2 diabetes mellitus without complications: Secondary | ICD-10-CM | POA: Diagnosis not present

## 2023-09-23 DIAGNOSIS — I10 Essential (primary) hypertension: Secondary | ICD-10-CM | POA: Diagnosis not present

## 2023-09-23 LAB — CMP14+EGFR
ALT: 33 IU/L (ref 0–44)
AST: 34 IU/L (ref 0–40)
Albumin: 4.5 g/dL (ref 3.8–4.8)
Alkaline Phosphatase: 60 IU/L (ref 44–121)
BUN/Creatinine Ratio: 15 (ref 10–24)
BUN: 12 mg/dL (ref 8–27)
Bilirubin Total: 0.5 mg/dL (ref 0.0–1.2)
CO2: 23 mmol/L (ref 20–29)
Calcium: 9.4 mg/dL (ref 8.6–10.2)
Chloride: 98 mmol/L (ref 96–106)
Creatinine, Ser: 0.82 mg/dL (ref 0.76–1.27)
Globulin, Total: 2.4 g/dL (ref 1.5–4.5)
Glucose: 118 mg/dL — ABNORMAL HIGH (ref 70–99)
Potassium: 5.1 mmol/L (ref 3.5–5.2)
Sodium: 136 mmol/L (ref 134–144)
Total Protein: 6.9 g/dL (ref 6.0–8.5)
eGFR: 93 mL/min/1.73 (ref >59)

## 2023-09-23 LAB — CBC WITH DIFFERENTIAL/PLATELET
Basophils Absolute: 0 x10E3/uL (ref 0.0–0.2)
Basos: 1 %
EOS (ABSOLUTE): 0.3 x10E3/uL (ref 0.0–0.4)
Eos: 4 %
Hematocrit: 47.9 % (ref 37.5–51.0)
Hemoglobin: 15.5 g/dL (ref 13.0–17.7)
Immature Grans (Abs): 0 x10E3/uL (ref 0.0–0.1)
Immature Granulocytes: 0 %
Lymphocytes Absolute: 2.5 x10E3/uL (ref 0.7–3.1)
Lymphs: 35 %
MCH: 30.5 pg (ref 26.6–33.0)
MCHC: 32.4 g/dL (ref 31.5–35.7)
MCV: 94 fL (ref 79–97)
Monocytes Absolute: 0.7 x10E3/uL (ref 0.1–0.9)
Monocytes: 9 %
Neutrophils Absolute: 3.6 x10E3/uL (ref 1.4–7.0)
Neutrophils: 51 %
Platelets: 200 x10E3/uL (ref 150–450)
RBC: 5.09 x10E6/uL (ref 4.14–5.80)
RDW: 13 % (ref 11.6–15.4)
WBC: 7.1 x10E3/uL (ref 3.4–10.8)

## 2023-09-23 LAB — BAYER DCA HB A1C WAIVED: HB A1C (BAYER DCA - WAIVED): 7.4 % — ABNORMAL HIGH (ref 4.8–5.6)

## 2023-09-23 LAB — LIPID PANEL
Chol/HDL Ratio: 2.9 ratio (ref 0.0–5.0)
Cholesterol, Total: 118 mg/dL (ref 100–199)
HDL: 41 mg/dL (ref >39)
LDL Chol Calc (NIH): 64 mg/dL (ref 0–99)
Triglycerides: 60 mg/dL (ref 0–149)
VLDL Cholesterol Cal: 13 mg/dL (ref 5–40)

## 2023-09-23 MED ORDER — AMLODIPINE BESY-BENAZEPRIL HCL 5-20 MG PO CAPS: 1.0000 | ORAL_CAPSULE | Freq: Every day | ORAL | 1 refills | Status: DC

## 2023-09-23 MED ORDER — METFORMIN HCL 1000 MG PO TABS: 1000.0000 mg | ORAL_TABLET | Freq: Two times a day (BID) | ORAL | 1 refills | Status: DC

## 2023-09-23 MED ORDER — ROSUVASTATIN CALCIUM 20 MG PO TABS: 20.0000 mg | ORAL_TABLET | Freq: Every day | ORAL | 1 refills | Status: DC

## 2023-09-23 MED ORDER — DAPAGLIFLOZIN PROPANEDIOL 10 MG PO TABS: 10.0000 mg | ORAL_TABLET | Freq: Every day | ORAL | 1 refills | Status: DC

## 2023-09-23 MED ORDER — SITAGLIPTIN PHOSPHATE 100 MG PO TABS
100.0000 mg | ORAL_TABLET | Freq: Every day | ORAL | 1 refills | Status: AC
Start: 2023-09-23 — End: ?

## 2023-09-23 MED ORDER — EZETIMIBE 10 MG PO TABS: 10.0000 mg | ORAL_TABLET | Freq: Every day | ORAL | 1 refills | Status: AC

## 2023-09-23 MED ORDER — GLIMEPIRIDE 4 MG PO TABS: 4.0000 mg | ORAL_TABLET | Freq: Every day | ORAL | 1 refills | Status: AC

## 2023-09-24 ENCOUNTER — Ambulatory Visit: Payer: Self-pay | Admitting: Nurse Practitioner

## 2023-12-23 DIAGNOSIS — L570 Actinic keratosis: Secondary | ICD-10-CM | POA: Diagnosis not present

## 2023-12-25 ENCOUNTER — Ambulatory Visit: Payer: Self-pay | Admitting: Nurse Practitioner

## 2023-12-25 ENCOUNTER — Encounter: Payer: Self-pay | Admitting: Nurse Practitioner

## 2023-12-25 VITALS — BP 132/78 | HR 59 | Temp 97.2°F | Ht 67.0 in | Wt 154.0 lb

## 2023-12-25 DIAGNOSIS — I10 Essential (primary) hypertension: Secondary | ICD-10-CM | POA: Diagnosis not present

## 2023-12-25 DIAGNOSIS — Z7984 Long term (current) use of oral hypoglycemic drugs: Secondary | ICD-10-CM

## 2023-12-25 DIAGNOSIS — Z6828 Body mass index (BMI) 28.0-28.9, adult: Secondary | ICD-10-CM | POA: Diagnosis not present

## 2023-12-25 DIAGNOSIS — E785 Hyperlipidemia, unspecified: Secondary | ICD-10-CM | POA: Diagnosis not present

## 2023-12-25 DIAGNOSIS — E1169 Type 2 diabetes mellitus with other specified complication: Secondary | ICD-10-CM | POA: Diagnosis not present

## 2023-12-25 DIAGNOSIS — E119 Type 2 diabetes mellitus without complications: Secondary | ICD-10-CM

## 2023-12-25 LAB — CBC WITH DIFFERENTIAL/PLATELET
Basophils Absolute: 0.1 x10E3/uL (ref 0.0–0.2)
Basos: 1 %
EOS (ABSOLUTE): 0.3 x10E3/uL (ref 0.0–0.4)
Eos: 4 %
Hematocrit: 46.9 % (ref 37.5–51.0)
Hemoglobin: 15.4 g/dL (ref 13.0–17.7)
Immature Grans (Abs): 0 x10E3/uL (ref 0.0–0.1)
Immature Granulocytes: 0 %
Lymphocytes Absolute: 2.3 x10E3/uL (ref 0.7–3.1)
Lymphs: 31 %
MCH: 30.7 pg (ref 26.6–33.0)
MCHC: 32.8 g/dL (ref 31.5–35.7)
MCV: 93 fL (ref 79–97)
Monocytes Absolute: 0.6 x10E3/uL (ref 0.1–0.9)
Monocytes: 8 %
Neutrophils Absolute: 4.2 x10E3/uL (ref 1.4–7.0)
Neutrophils: 56 %
Platelets: 218 x10E3/uL (ref 150–450)
RBC: 5.02 x10E6/uL (ref 4.14–5.80)
RDW: 12.2 % (ref 11.6–15.4)
WBC: 7.5 x10E3/uL (ref 3.4–10.8)

## 2023-12-25 LAB — CMP14+EGFR
ALT: 32 IU/L (ref 0–44)
AST: 33 IU/L (ref 0–40)
Albumin: 4.4 g/dL (ref 3.8–4.8)
Alkaline Phosphatase: 65 IU/L (ref 47–123)
BUN/Creatinine Ratio: 16 (ref 10–24)
BUN: 14 mg/dL (ref 8–27)
Bilirubin Total: 0.4 mg/dL (ref 0.0–1.2)
CO2: 24 mmol/L (ref 20–29)
Calcium: 9.3 mg/dL (ref 8.6–10.2)
Chloride: 100 mmol/L (ref 96–106)
Creatinine, Ser: 0.89 mg/dL (ref 0.76–1.27)
Globulin, Total: 2.6 g/dL (ref 1.5–4.5)
Glucose: 123 mg/dL — ABNORMAL HIGH (ref 70–99)
Potassium: 5.2 mmol/L (ref 3.5–5.2)
Sodium: 137 mmol/L (ref 134–144)
Total Protein: 7 g/dL (ref 6.0–8.5)
eGFR: 91 mL/min/1.73 (ref >59)

## 2023-12-25 LAB — BAYER DCA HB A1C WAIVED: HB A1C (BAYER DCA - WAIVED): 7.3 % — ABNORMAL HIGH (ref 4.8–5.6)

## 2023-12-25 LAB — LIPID PANEL
Chol/HDL Ratio: 2.7 ratio (ref 0.0–5.0)
Cholesterol, Total: 123 mg/dL (ref 100–199)
HDL: 46 mg/dL (ref >39)
LDL Chol Calc (NIH): 63 mg/dL (ref 0–99)
Triglycerides: 64 mg/dL (ref 0–149)
VLDL Cholesterol Cal: 14 mg/dL (ref 5–40)

## 2023-12-25 NOTE — Progress Notes (Signed)
 Subjective:    Patient ID: Rodney Glover, male    DOB: Oct 25, 1950, 73 y.o.   MRN: [id number]   Chief Complaint: medical management of chronic issues     HPI:  Rodney Glover is a 73 y.o. who identifies as a male who was assigned male at birth.   Social history: Lives with: wife Work history: retired- theme park manager   Comes in today for follow up of the following chronic medical issues:  1. Primary hypertension No c/o chest pain, sob or headache. Does not check blood pressure at home. BP Readings from Last 3 Encounters:  09/23/23 114/65  06/12/23 138/75  03/14/23 134/75     2. Hyperlipidemia associated with type 2 diabetes mellitus (HCC) Does try to watch diet and stays active. Lab Results  Component Value Date   CHOL 118 09/23/2023   HDL 41 09/23/2023   LDLCALC 64 09/23/2023   TRIG 60 09/23/2023   CHOLHDL 2.9 09/23/2023      3. Diabetes mellitus treated with oral medication (HCC) Fasting blood sugars averaging 115-120. Lab Results  Component Value Date   HGBA1C 7.4 (H) 09/23/2023     4. BMI 24.0-24.9,adult No recent weight changes  Wt Readings from Last 3 Encounters:  12/25/23 154 lb (69.9 kg)  09/23/23 153 lb (69.4 kg)  06/12/23 152 lb (68.9 kg)   BMI Readings from Last 3 Encounters:  12/25/23 24.12 kg/m  09/23/23 23.96 kg/m  06/12/23 23.81 kg/m     New complaints: Saw dermatology yesterday and had several areas frozen on forearms and face.  Allergies  Allergen Reactions   Niaspan [Niacin Er (Antihyperlipidemic)] Itching   Penicillins     REACTION: hives   Outpatient Encounter Medications as of 12/25/2023  Medication Sig   Accu-Chek Softclix Lancets lancets Test BS daily Dx E11.9   Alcohol Swabs (B-D SINGLE USE SWABS REGULAR) PADS Test BS daily Dx E11.9   amLODipine -benazepril  (LOTREL) 5-20 MG capsule Take 1 capsule by mouth daily.   aspirin 81 MG tablet Take 81 mg by mouth daily.   Blood Glucose Monitoring Suppl (ACCU-CHEK  AVIVA PLUS) w/Device KIT Use to check blood sugar daily   Cholecalciferol (VITAMIN D -3) 5000 UNITS TABS Take 10,000 each by mouth in the morning and at bedtime.   cyanocobalamin  1000 MCG tablet Take 1,000 mcg by mouth daily.   dapagliflozin  propanediol (FARXIGA ) 10 MG TABS tablet Take 1 tablet (10 mg total) by mouth daily.   ezetimibe  (ZETIA ) 10 MG tablet Take 1 tablet (10 mg total) by mouth daily.   fish oil-omega-3 fatty acids 1000 MG capsule Take 2 g by mouth daily. 4 per day 1000mg  per day   glimepiride  (AMARYL ) 4 MG tablet Take 1 tablet (4 mg total) by mouth daily with breakfast.   glucose blood (ACCU-CHEK AVIVA PLUS) test strip Test BS daily Dx E11.9   Melatonin 10 MG TABS    metFORMIN  (GLUCOPHAGE ) 1000 MG tablet Take 1 tablet (1,000 mg total) by mouth 2 (two) times daily with a meal.   Multiple Vitamin (MULTIVITAMIN) capsule Take 1 capsule by mouth daily.   rosuvastatin  (CRESTOR ) 20 MG tablet Take 1 tablet (20 mg total) by mouth daily.   sitaGLIPtin  (JANUVIA ) 100 MG tablet Take 1 tablet (100 mg total) by mouth daily.   TURMERIC PO Take by mouth.   No facility-administered encounter medications on file as of 12/25/2023.    Past Surgical History:  Procedure Laterality Date   APPENDECTOMY     COLONOSCOPY  HERNIA REPAIR     KNEE SURGERY Bilateral 12/07/2012   Dr. Duwayne   POLYPECTOMY      Family History  Problem Relation Age of Onset   Hyperlipidemia Mother    Heart disease Father    Cancer Father        bladder, and prostate   Stroke Father    Coronary artery disease Father    Prostate cancer Father    Colon cancer Father    Hyperlipidemia Sister    Hypertension Sister    Prostate cancer Other    Diabetes Other    Heart disease Other    Esophageal cancer Neg Hx    Rectal cancer Neg Hx    Stomach cancer Neg Hx    Colon polyps Neg Hx       Controlled substance contract: n/a     Review of Systems  Constitutional:  Negative for diaphoresis.  Eyes:  Negative  for pain.  Respiratory:  Negative for shortness of breath.   Cardiovascular:  Negative for chest pain, palpitations and leg swelling.  Gastrointestinal:  Negative for abdominal pain.  Endocrine: Negative for polydipsia.  Skin:  Negative for rash.  Neurological:  Negative for dizziness, weakness and headaches.  Hematological:  Does not bruise/bleed easily.  All other systems reviewed and are negative.      Objective:   Physical Exam Vitals and nursing note reviewed.  Constitutional:      Appearance: Normal appearance. He is well-developed.  HENT:     Head: Normocephalic.     Nose: Nose normal.     Mouth/Throat:     Mouth: Mucous membranes are moist.     Pharynx: Oropharynx is clear.  Eyes:     Pupils: Pupils are equal, round, and reactive to light.  Neck:     Thyroid: No thyroid mass or thyromegaly.     Vascular: No carotid bruit or JVD.     Trachea: Phonation normal.  Cardiovascular:     Rate and Rhythm: Normal rate and regular rhythm.  Pulmonary:     Effort: Pulmonary effort is normal. No respiratory distress.     Breath sounds: Normal breath sounds.  Abdominal:     General: Bowel sounds are normal.     Palpations: Abdomen is soft.     Tenderness: There is no abdominal tenderness.  Musculoskeletal:        General: Normal range of motion.     Cervical back: Normal range of motion and neck supple.  Lymphadenopathy:     Cervical: No cervical adenopathy.  Skin:    General: Skin is warm and dry.  Neurological:     Mental Status: He is alert and oriented to person, place, and time.  Psychiatric:        Behavior: Behavior normal.        Thought Content: Thought content normal.        Judgment: Judgment normal.     BP 132/78   Pulse (!) 59   Temp (!) 97.2 F (36.2 C) (Temporal)   Ht 5' 7 (1.702 m)   Wt 154 lb (69.9 kg)   SpO2 98%   BMI 24.12 kg/m     Hgba1c 7.3%      Assessment & Plan:   Rodney Glover comes in today with chief complaint of medical  management of chronic issues    Diagnosis and orders addressed:  1. Primary hypertension Low sodium diet - CBC with Differential/Platelet - CMP14+EGFR - amLODipine -benazepril  (LOTREL) 5-20 MG  capsule; Take 1 capsule by mouth daily.  Dispense: 90 capsule; Refill: 1  2. Hyperlipidemia associated with type 2 diabetes mellitus (HCC) Low fat diet - Lipid panel - ezetimibe  (ZETIA ) 10 MG tablet; Take 1 tablet (10 mg total) by mouth daily.  Dispense: 90 tablet; Refill: 1 - rosuvastatin  (CRESTOR ) 20 MG tablet; Take 1 tablet (20 mg total) by mouth daily.  Dispense: 90 tablet; Refill: 1  3. Diabetes mellitus treated with oral medication (HCC) Continue low carb diet - Bayer DCA Hb A1c Waived - glimepiride  (AMARYL ) 4 MG tablet; Take 2 tablets (8 mg total) by mouth daily with breakfast.  Dispense: 180 tablet; Refill: 0 - metFORMIN  (GLUCOPHAGE ) 1000 MG tablet; Take 1 tablet (1,000 mg total) by mouth 2 (two) times daily with a meal.  Dispense: 180 tablet; Refill: 1 - sitaGLIPtin  (JANUVIA ) 100 MG tablet; Take 1 tablet (100 mg total) by mouth daily.  Dispense: 90 tablet; Refill: 1 - farxiga  10mg  1 po daily #90 1 refill  4. BMI 28.0-28.9,adult Discussed diet and exercise for person with BMI >25 Will recheck weight in 3-6 months   Labs pending Health Maintenance reviewed Diet and exercise encouraged  Follow up plan: 6 months   Mary-Margaret Gladis, FNP

## 2023-12-26 ENCOUNTER — Ambulatory Visit: Payer: Self-pay | Admitting: Nurse Practitioner

## 2024-02-25 ENCOUNTER — Other Ambulatory Visit: Payer: Self-pay | Admitting: Nurse Practitioner

## 2024-02-25 DIAGNOSIS — E1169 Type 2 diabetes mellitus with other specified complication: Secondary | ICD-10-CM

## 2024-02-25 DIAGNOSIS — I10 Essential (primary) hypertension: Secondary | ICD-10-CM

## 2024-03-08 ENCOUNTER — Other Ambulatory Visit: Payer: Self-pay | Admitting: Nurse Practitioner

## 2024-03-08 DIAGNOSIS — E119 Type 2 diabetes mellitus without complications: Secondary | ICD-10-CM

## 2024-03-10 ENCOUNTER — Other Ambulatory Visit: Payer: Self-pay | Admitting: Nurse Practitioner

## 2024-03-10 DIAGNOSIS — E119 Type 2 diabetes mellitus without complications: Secondary | ICD-10-CM

## 2024-06-17 ENCOUNTER — Ambulatory Visit: Admitting: Nurse Practitioner
